# Patient Record
Sex: Male | Born: 1950 | Race: White | Hispanic: No | Marital: Married | Smoking: Current every day smoker
Health system: Southern US, Community
[De-identification: ages and names within clinical notes are randomized; demographics above are authoritative.]

## PROBLEM LIST (undated history)

## (undated) DIAGNOSIS — I739 Peripheral vascular disease, unspecified: Secondary | ICD-10-CM

## (undated) DIAGNOSIS — J329 Chronic sinusitis, unspecified: Secondary | ICD-10-CM

## (undated) DIAGNOSIS — G4700 Insomnia, unspecified: Secondary | ICD-10-CM

## (undated) DIAGNOSIS — H6690 Otitis media, unspecified, unspecified ear: Secondary | ICD-10-CM

## (undated) DIAGNOSIS — M1990 Unspecified osteoarthritis, unspecified site: Secondary | ICD-10-CM

## (undated) DIAGNOSIS — K121 Other forms of stomatitis: Secondary | ICD-10-CM

## (undated) DIAGNOSIS — E871 Hypo-osmolality and hyponatremia: Secondary | ICD-10-CM

## (undated) DIAGNOSIS — J449 Chronic obstructive pulmonary disease, unspecified: Secondary | ICD-10-CM

## (undated) DIAGNOSIS — E119 Type 2 diabetes mellitus without complications: Secondary | ICD-10-CM

## (undated) DIAGNOSIS — I10 Essential (primary) hypertension: Secondary | ICD-10-CM

## (undated) DIAGNOSIS — N200 Calculus of kidney: Secondary | ICD-10-CM

## (undated) HISTORY — PX: OTHER SURGICAL HISTORY: SHX169

## (undated) HISTORY — PX: DENTAL SURGERY: SHX609

## (undated) HISTORY — PX: HERNIA REPAIR: SHX51

## (undated) HISTORY — PX: NECK SURGERY: SHX720

---

## 1999-05-02 ENCOUNTER — Encounter: Payer: Self-pay | Admitting: Neurosurgery

## 1999-05-02 ENCOUNTER — Encounter: Admission: RE | Admit: 1999-05-02 | Discharge: 1999-05-02 | Payer: Self-pay | Admitting: Neurosurgery

## 2001-09-16 ENCOUNTER — Encounter: Payer: Self-pay | Admitting: Family Medicine

## 2001-09-16 ENCOUNTER — Ambulatory Visit (HOSPITAL_COMMUNITY): Admission: RE | Admit: 2001-09-16 | Discharge: 2001-09-16 | Payer: Self-pay | Admitting: Family Medicine

## 2001-11-05 ENCOUNTER — Ambulatory Visit (HOSPITAL_COMMUNITY): Admission: RE | Admit: 2001-11-05 | Discharge: 2001-11-05 | Payer: Self-pay | Admitting: Gastroenterology

## 2001-11-05 ENCOUNTER — Encounter (INDEPENDENT_AMBULATORY_CARE_PROVIDER_SITE_OTHER): Payer: Self-pay | Admitting: *Deleted

## 2002-09-02 ENCOUNTER — Ambulatory Visit (HOSPITAL_COMMUNITY): Admission: RE | Admit: 2002-09-02 | Discharge: 2002-09-02 | Payer: Self-pay | Admitting: Family Medicine

## 2002-09-02 ENCOUNTER — Encounter: Payer: Self-pay | Admitting: Family Medicine

## 2002-11-20 ENCOUNTER — Ambulatory Visit (HOSPITAL_COMMUNITY): Admission: RE | Admit: 2002-11-20 | Discharge: 2002-11-20 | Payer: Self-pay | Admitting: Neurosurgery

## 2002-11-20 ENCOUNTER — Encounter: Payer: Self-pay | Admitting: Neurosurgery

## 2002-12-26 ENCOUNTER — Encounter: Payer: Self-pay | Admitting: Neurosurgery

## 2002-12-30 ENCOUNTER — Inpatient Hospital Stay (HOSPITAL_COMMUNITY): Admission: RE | Admit: 2002-12-30 | Discharge: 2002-12-31 | Payer: Self-pay | Admitting: Neurosurgery

## 2002-12-30 ENCOUNTER — Encounter: Payer: Self-pay | Admitting: Neurosurgery

## 2003-01-05 ENCOUNTER — Inpatient Hospital Stay (HOSPITAL_COMMUNITY): Admission: RE | Admit: 2003-01-05 | Discharge: 2003-01-08 | Payer: Self-pay | Admitting: Neurosurgery

## 2005-07-24 ENCOUNTER — Ambulatory Visit (HOSPITAL_COMMUNITY): Admission: RE | Admit: 2005-07-24 | Discharge: 2005-07-24 | Payer: Self-pay | Admitting: Family Medicine

## 2007-04-30 ENCOUNTER — Encounter: Admission: RE | Admit: 2007-04-30 | Discharge: 2007-04-30 | Payer: Self-pay | Admitting: Internal Medicine

## 2007-06-24 ENCOUNTER — Encounter: Admission: RE | Admit: 2007-06-24 | Discharge: 2007-06-24 | Payer: Self-pay | Admitting: Internal Medicine

## 2007-07-17 ENCOUNTER — Ambulatory Visit (HOSPITAL_COMMUNITY): Admission: RE | Admit: 2007-07-17 | Discharge: 2007-07-17 | Payer: Self-pay | Admitting: Gastroenterology

## 2007-09-30 ENCOUNTER — Encounter: Admission: RE | Admit: 2007-09-30 | Discharge: 2007-09-30 | Payer: Self-pay | Admitting: Neurosurgery

## 2007-12-31 ENCOUNTER — Ambulatory Visit (HOSPITAL_COMMUNITY): Admission: RE | Admit: 2007-12-31 | Discharge: 2007-12-31 | Payer: Self-pay | Admitting: Family Medicine

## 2008-12-23 ENCOUNTER — Encounter: Admission: RE | Admit: 2008-12-23 | Discharge: 2008-12-23 | Payer: Self-pay | Admitting: Gastroenterology

## 2009-11-05 ENCOUNTER — Encounter: Admission: RE | Admit: 2009-11-05 | Discharge: 2009-11-05 | Payer: Self-pay | Admitting: Neurosurgery

## 2010-08-12 NOTE — Op Note (Signed)
   NAME:  Terry Munoz, Terry Munoz                        ACCOUNT NO.:  [id number]   MEDICAL RECORD NO.:  [id number]                   PATIENT TYPE:  OIB   LOCATION:  3104                                 FACILITY:  [facility]   PHYSICIAN:  Payton Doughty, M.D.                   DATE OF BIRTH:  1950-10-18   DATE OF PROCEDURE:  01/05/2003  DATE OF DISCHARGE:                                 OPERATIVE REPORT   PREOPERATIVE DIAGNOSIS:  Neck mass.   POSTOPERATIVE DIAGNOSIS:  Neck hematoma.   SURGEON:  Payton Doughty, M.D.   ANESTHESIA:  General endotracheal.   PREPARATION:  Prepped with alcohol wipe.   COMPLICATIONS:  None.   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  He has a neck mass.   DESCRIPTION OF PROCEDURE:  The patient was taken to the operating  room___________ intubated.  He was placed supine on the operating room  table.  Following he was shaved, prepped and draped in usual sterile  fashion.  The old skin incision was reopened.  Immediately beneath the  platysma was found a hematoma under significant pressure.  This came out of  the anterior cervical space and was evacuated with suction.  Careful  inspection did not reveal any frank bleeding points.  The wound was  irrigated copiously with saline; again, no bleeding points could be  identified.  The esophagus was carefully inspected and found to be intact.  A drain was placed just anterior to the plate and exited by separate  incision.  The wound was once again irrigated.  Hemostasis assured.  The  platysma was reapproximated with 3-0 Vicryl interrupted fashion.  The  subcutaneous tissue was reapproximated with 3-0 Vicryl in interrupted  fashion.  The skin was closed with 4-0 Vicryl in running subcuticular  fashion.  The drain was secured in place with a single 3-0 Vicryl.  Betadine  Telfa dressing was applied at the drain.   Steri-Strips and OpSite were  applied at the incision.  The patient then placed in a [place] collar and  returned to the recovery room in good condition.                                               Payton Doughty, M.D.    MWR/MEDQ  D:  01/05/2003  T:  01/06/2003  Job:  [id number]
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
## 2010-08-12 NOTE — Discharge Summary (Signed)
   NAME:  Terry Munoz, Terry Munoz                        ACCOUNT NO.:  [id number]   MEDICAL RECORD NO.:  [id number]                   PATIENT TYPE:  OIB   LOCATION:  3039                                 FACILITY:  [facility]   PHYSICIAN:  Payton Doughty, M.D.                   DATE OF BIRTH:  [birth year]   DATE OF ADMISSION:  01/05/2003  DATE OF DISCHARGE:  01/08/2003                                 DISCHARGE SUMMARY   ADMISSION DIAGNOSIS:  Neck hematoma status post anterior cervical  diskectomy.   DISCHARGE DIAGNOSIS:  Neck hematoma status post anterior cervical  diskectomy.   PROCEDURES:  Exploration of cervical incision.   COMPLICATIONS:  None.   DISCHARGE STATUS:  Alive and well.   HOSPITAL COURSE:  This is a 60 year old gentleman who underwent anterior  cervical a week and a half ago, reported to the office on October 11 with  dysphagia and difficulty with respirations.  Was admitted to the hospital  and underwent exploration of his wound.  Medical history is remarkable for  adult-onset diabetes.  General exam was intact save for some mild stridor.  Neurologically, he was intact.   Exploration yielded a hematoma.  A drain was placed.  Postoperatively, he  has done well.  Drain was left in for two days.  Output diminished to 3 ml  per shift.  He is now eating well, experiencing a minimal amount of  dysphagia.  Airway is good with no difficulty with respiration.  He is  afebrile.   He is being discharged home in the care of his family with Darvocet for  pain, nicotine patches, and Ambien for sleep.  His followup will be in the  [facility]' office in about 10 days.                                                Payton Doughty, M.D.    MWR/MEDQ  D:  01/08/2003  T:  01/08/2003  Job:  [id number]

## 2010-08-12 NOTE — Op Note (Signed)
NAME:  Terry Munoz, Terry Munoz                        ACCOUNT NO.:  [id number]   MEDICAL RECORD NO.:  [id number]                   PATIENT TYPE:  INP   LOCATION:  3013                                 FACILITY:  [facility]   PHYSICIAN:  Payton Doughty, M.D.                   DATE OF BIRTH:  July 19, 1950   DATE OF PROCEDURE:  12/30/2002  DATE OF DISCHARGE:                                 OPERATIVE REPORT   PREOPERATIVE DIAGNOSIS:  Cervical spondylosis and myelopathy.   POSTOPERATIVE DIAGNOSIS:  Cervical spondylosis and myelopathy.   PROCEDURE:  C4-5, C5-6, C6-7 anterior cervical decompression and fusion with  a Tether plate.   SURGEON:  Payton Doughty, M.D.   NURSE ASSISTANT:  [facility]   DOCTOR ASSISTANT:  Hilda Lias, M.D.   ANESTHESIA:  General endotracheal.   PREPARATION:  Sterile Betadine prep and scrub with alcohol wipe.   COMPLICATIONS:  none.   BODY OF TEXT:  This is a 60 year old gentleman who has had a previous fusion  at C3-4.  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  Taken to the operating room and smoothly anesthetized and intubated, placed  supine on the operating table with neck slightly extended in the Holter head  traction.  Following shave, prep, and drape in the usual sterile fashion,  the skin was incised starting two fingerbreadths below the level of the  carotid tubercle and extended in a cephalad dressing approximately 8 cm,  paralleling the sternocleidomastoid muscle on the left side.  The platysma  was identified, elevated, divided, and undermined.  The sternocleidomastoid  was identified and medial dissection revealed the carotid artery retracted  laterally to the left, trachea and esophagus retracted laterally to the  right, exposing the bones of the anterior cervical spine.  Dissection was  carried out and prominent landmarks of the osteophytes of C5-6 and C6-7 as  well as the plate at N8-2 were identified, thus obviating the need for  intraoperative x-ray at this point.  The longus colli was taken down  bilaterally and retracted.  The esophagus was protected.  Diskectomy was  carried out at C4-5, C5-6, and then at C6-7 under gross observation.  The  operating microscope was then brought in.  We used microdissection technique  to dissect the anterior epidural space, remove the osteophytes, decompress  the spinal cord and the neural foramina bilaterally.  Having completed this  decompression, 7 mm bone grafts were fashioned from patellar allograft and  tapped into place at each level.  A three-level Tether plate was then placed  with two screws in C4, two in C7, one in C5, and one in C6.  Intraoperative  x-ray showed good placement of bone graft, plate, and screws.  The wound was  irrigated and hemostasis assured.  The platysma was reapproximated with 3-0  Vicryl in interrupted fashion, the subcutaneous tissue was  reapproximated with  3-0 Vicryl in interrupted fashion, and the skin was  closed with 4-0 Vicryl in a running subcuticular fashion.  Benzoin and Steri-  Strips were placed, made occlusive with Telfa and OpSite, and the patient  returned to the recovery room in good condition.                                                Payton Doughty, M.D.    MWR/MEDQ  D:  12/30/2002  T:  12/31/2002  Job:  [id number]

## 2010-08-12 NOTE — H&P (Signed)
NAME:  Terry Munoz, Terry Munoz                        ACCOUNT NO.:  [id number]   MEDICAL RECORD NO.:  [id number]                   PATIENT TYPE:  OIB   LOCATION:  2857                                 FACILITY:  [facility]   PHYSICIAN:  Payton Doughty, M.D.                   DATE OF BIRTH:  09-Jun-1950   DATE OF ADMISSION:  01/05/2003  DATE OF DISCHARGE:                                HISTORY & PHYSICAL   ADMITTING DIAGNOSIS:  Neck mass.   HISTORY:  This is a very nice 60 year old right-handed white gentleman who  underwent an anterior cervical about a week ago.  It was done at C4-5, C5-6,  and C6-7, did well until 3 days ago when he noticed increased difficulty  with swallowing, not particularly painful.  He has some swelling of his neck  on the left side, it is not fluctuant, and he has not had any fever.  He  presents to the office with some dysphagia.  Medical history is remarkable  for diabetes on Glucophage 1000 mg b.i.d., as noted above he has had an  anterior cervical in 1999, hemothorax in 1984, kidney stones in 1990, he has  had a stress test a year ago that was all right.  His other meds are  Prilosec 20 mg a day.  He is allergic to SULFA.   SOCIAL HISTORY:  He smokes two to three packs of cigarettes a day, drinks  alcohol socially, and has been driving a truck.   FAMILY HISTORY:  Mother 57, fair health.  Dad 81, fair health.   REVIEW OF SYSTEMS:  Noncontributory for bladder dysfunction although he does  have neck pain, shoulder pain, arm pain and tingling, he had positive  Lhermitte's, these are all resolved, now he has dysphagia.   PHYSICAL EXAMINATION:  HEENT:  Within normal limits.  NECK:  He has limited range of motion in his neck.  The neck is tender on  the left side, there is no fluctuance, it is not red.  CHEST:  Diffuse crackles.  CARDIAC:  Regular rate and rhythm.  ABDOMEN:  Nontender.  No hepatosplenomegaly.  EXTREMITIES:  Without clubbing or cyanosis.  GU:  Deferred.   Peripheral pulses are good.  NEUROLOGICAL:  He is awake, alert, and oriented.  His cranial nerves are  intact.  Motor exam is 5/5 strength throughout the upper and lower  extremities.  Hoffmann's is now negative.  Reflexes are 2 throughout the  upper extremities, lower extremities are nonpathologic.  Lateral c-spine  demonstrates a lot of soft tissue swelling anterior to his fusion.   CLINICAL IMPRESSION:  Probable hematoma.  Obviously, there is a concern for  esophageal perforation although he does not look particularly toxic nor does  he have a fever.    PLAN:  Exploration of the neck.  The risks and benefits of this approach  have been discussed with him.  He wishes to proceed.                                                Payton Doughty, M.D.    MWR/MEDQ  D:  01/05/2003  T:  01/05/2003  Job:  [id number]

## 2010-11-03 ENCOUNTER — Other Ambulatory Visit: Payer: Self-pay | Admitting: Neurosurgery

## 2010-11-03 DIAGNOSIS — M47816 Spondylosis without myelopathy or radiculopathy, lumbar region: Secondary | ICD-10-CM

## 2010-11-04 ENCOUNTER — Ambulatory Visit
Admission: RE | Admit: 2010-11-04 | Discharge: 2010-11-04 | Disposition: A | Discharge status: Home / Self Care | Payer: 59 | Source: Ambulatory Visit | Attending: Neurosurgery | Admitting: Neurosurgery

## 2010-11-04 DIAGNOSIS — M47816 Spondylosis without myelopathy or radiculopathy, lumbar region: Secondary | ICD-10-CM

## 2010-11-04 MED ORDER — IOHEXOL 180 MG/ML  SOLN
1.0000 mL | Freq: Once | INTRAMUSCULAR | Status: AC | PRN
  Administered 2010-11-04: 1 mL via EPIDURAL

## 2010-11-04 MED ORDER — METHYLPREDNISOLONE ACETATE 40 MG/ML INJ SUSP (RADIOLOG
120.0000 mg | Freq: Once | INTRAMUSCULAR | Status: AC
  Administered 2010-11-04: 120 mg via EPIDURAL

## 2010-11-30 ENCOUNTER — Other Ambulatory Visit: Payer: Self-pay | Admitting: Neurosurgery

## 2010-11-30 DIAGNOSIS — M5126 Other intervertebral disc displacement, lumbar region: Secondary | ICD-10-CM

## 2010-12-07 ENCOUNTER — Ambulatory Visit
Admission: RE | Admit: 2010-12-07 | Discharge: 2010-12-07 | Disposition: A | Discharge status: Home / Self Care | Payer: 59 | Source: Ambulatory Visit | Attending: Neurosurgery | Admitting: Neurosurgery

## 2010-12-07 DIAGNOSIS — M5126 Other intervertebral disc displacement, lumbar region: Secondary | ICD-10-CM

## 2010-12-07 MED ORDER — METHYLPREDNISOLONE ACETATE 40 MG/ML INJ SUSP (RADIOLOG
120.0000 mg | Freq: Once | INTRAMUSCULAR | Status: AC
  Administered 2010-12-07: 120 mg via EPIDURAL

## 2010-12-07 MED ORDER — IOHEXOL 180 MG/ML  SOLN
1.0000 mL | Freq: Once | INTRAMUSCULAR | Status: AC | PRN
  Administered 2010-12-07: 1 mL via EPIDURAL

## 2010-12-28 ENCOUNTER — Other Ambulatory Visit: Payer: Self-pay | Admitting: Neurosurgery

## 2010-12-28 DIAGNOSIS — M5126 Other intervertebral disc displacement, lumbar region: Secondary | ICD-10-CM

## 2011-01-03 ENCOUNTER — Ambulatory Visit
Admission: RE | Admit: 2011-01-03 | Discharge: 2011-01-03 | Disposition: A | Discharge status: Home / Self Care | Payer: 59 | Source: Ambulatory Visit | Attending: Neurosurgery | Admitting: Neurosurgery

## 2011-01-03 DIAGNOSIS — M5126 Other intervertebral disc displacement, lumbar region: Secondary | ICD-10-CM

## 2011-01-03 MED ORDER — METHYLPREDNISOLONE ACETATE 40 MG/ML INJ SUSP (RADIOLOG
120.0000 mg | Freq: Once | INTRAMUSCULAR | Status: AC
  Administered 2011-01-03: 120 mg via EPIDURAL

## 2011-01-03 MED ORDER — IOHEXOL 180 MG/ML  SOLN
1.0000 mL | Freq: Once | INTRAMUSCULAR | Status: AC | PRN
  Administered 2011-01-03: 1 mL via EPIDURAL

## 2013-02-22 ENCOUNTER — Emergency Department (HOSPITAL_COMMUNITY)

## 2013-02-22 ENCOUNTER — Emergency Department (HOSPITAL_COMMUNITY)
Admission: EM | Admit: 2013-02-22 | Discharge: 2013-02-22 | Disposition: A | Discharge status: Home / Self Care | Attending: Emergency Medicine | Admitting: Emergency Medicine

## 2013-02-22 ENCOUNTER — Encounter (HOSPITAL_COMMUNITY): Payer: Self-pay | Admitting: Emergency Medicine

## 2013-02-22 DIAGNOSIS — Z794 Long term (current) use of insulin: Secondary | ICD-10-CM | POA: Insufficient documentation

## 2013-02-22 DIAGNOSIS — R079 Chest pain, unspecified: Secondary | ICD-10-CM

## 2013-02-22 DIAGNOSIS — Z8669 Personal history of other diseases of the nervous system and sense organs: Secondary | ICD-10-CM | POA: Insufficient documentation

## 2013-02-22 DIAGNOSIS — I10 Essential (primary) hypertension: Secondary | ICD-10-CM | POA: Insufficient documentation

## 2013-02-22 DIAGNOSIS — J4489 Other specified chronic obstructive pulmonary disease: Secondary | ICD-10-CM | POA: Insufficient documentation

## 2013-02-22 DIAGNOSIS — F17200 Nicotine dependence, unspecified, uncomplicated: Secondary | ICD-10-CM | POA: Insufficient documentation

## 2013-02-22 DIAGNOSIS — Z8719 Personal history of other diseases of the digestive system: Secondary | ICD-10-CM | POA: Insufficient documentation

## 2013-02-22 DIAGNOSIS — R0789 Other chest pain: Secondary | ICD-10-CM | POA: Insufficient documentation

## 2013-02-22 DIAGNOSIS — E119 Type 2 diabetes mellitus without complications: Secondary | ICD-10-CM | POA: Insufficient documentation

## 2013-02-22 DIAGNOSIS — Z79899 Other long term (current) drug therapy: Secondary | ICD-10-CM | POA: Insufficient documentation

## 2013-02-22 DIAGNOSIS — Z87442 Personal history of urinary calculi: Secondary | ICD-10-CM | POA: Insufficient documentation

## 2013-02-22 DIAGNOSIS — M1990 Unspecified osteoarthritis, unspecified site: Secondary | ICD-10-CM | POA: Insufficient documentation

## 2013-02-22 DIAGNOSIS — J449 Chronic obstructive pulmonary disease, unspecified: Secondary | ICD-10-CM | POA: Insufficient documentation

## 2013-02-22 HISTORY — DX: Type 2 diabetes mellitus without complications: E11.9

## 2013-02-22 HISTORY — DX: Calculus of kidney: N20.0

## 2013-02-22 HISTORY — DX: Unspecified osteoarthritis, unspecified site: M19.90

## 2013-02-22 HISTORY — DX: Insomnia, unspecified: G47.00

## 2013-02-22 HISTORY — DX: Essential (primary) hypertension: I10

## 2013-02-22 HISTORY — DX: Otitis media, unspecified, unspecified ear: H66.90

## 2013-02-22 HISTORY — DX: Chronic obstructive pulmonary disease, unspecified: J44.9

## 2013-02-22 HISTORY — DX: Chronic sinusitis, unspecified: J32.9

## 2013-02-22 HISTORY — DX: Other forms of stomatitis: K12.1

## 2013-02-22 LAB — BASIC METABOLIC PANEL
BUN: 14 mg/dL (ref 6–23)
CO2: 23 mEq/L (ref 19–32)
Calcium: 10.4 mg/dL (ref 8.4–10.5)
Chloride: 90 mEq/L — ABNORMAL LOW (ref 96–112)
Creatinine, Ser: 1.04 mg/dL (ref 0.50–1.35)
GFR calc Af Amer: 86 mL/min — ABNORMAL LOW (ref >90)
GFR calc non Af Amer: 74 mL/min — ABNORMAL LOW (ref >90)
Glucose, Bld: 216 mg/dL — ABNORMAL HIGH (ref 70–99)
Potassium: 4.2 mEq/L (ref 3.5–5.1)
Sodium: 133 mEq/L — ABNORMAL LOW (ref 135–145)

## 2013-02-22 LAB — CBC WITH DIFFERENTIAL/PLATELET
Basophils Absolute: 0 10*3/uL (ref 0.0–0.1)
Basophils Relative: 0 % (ref 0–1)
Eosinophils Absolute: 0 10*3/uL (ref 0.0–0.7)
Eosinophils Relative: 0 % (ref 0–5)
HCT: 47.5 % (ref 39.0–52.0)
Hemoglobin: 17.2 g/dL — ABNORMAL HIGH (ref 13.0–17.0)
Lymphocytes Relative: 7 % — ABNORMAL LOW (ref 12–46)
Lymphs Abs: 1.2 10*3/uL (ref 0.7–4.0)
MCH: 34 pg (ref 26.0–34.0)
MCHC: 36.2 g/dL — ABNORMAL HIGH (ref 30.0–36.0)
MCV: 93.9 fL (ref 78.0–100.0)
Monocytes Absolute: 1.2 10*3/uL — ABNORMAL HIGH (ref 0.1–1.0)
Monocytes Relative: 7 % (ref 3–12)
Neutro Abs: 15.1 10*3/uL — ABNORMAL HIGH (ref 1.7–7.7)
Neutrophils Relative %: 86 % — ABNORMAL HIGH (ref 43–77)
Platelets: 237 10*3/uL (ref 150–400)
RBC: 5.06 MIL/uL (ref 4.22–5.81)
RDW: 13.4 % (ref 11.5–15.5)
WBC: 17.5 10*3/uL — ABNORMAL HIGH (ref 4.0–10.5)

## 2013-02-22 LAB — TROPONIN I: Troponin I: <0.3 ng/mL (ref <0.30)

## 2013-02-22 MED ORDER — SODIUM CHLORIDE 0.9 % IV BOLUS (SEPSIS)
1000.0000 mL | Freq: Once | INTRAVENOUS | Status: AC
  Administered 2013-02-22: 1000 mL via INTRAVENOUS

## 2013-02-22 MED ORDER — GI COCKTAIL ~~LOC~~
10.0000 mL | Freq: Once | ORAL | Status: DC
  Filled 2013-02-22: qty 30

## 2013-02-22 MED ORDER — GI COCKTAIL ~~LOC~~
30.0000 mL | Freq: Once | ORAL | Status: AC
  Administered 2013-02-22: 30 mL via ORAL

## 2013-02-22 MED ORDER — PANTOPRAZOLE SODIUM 40 MG IV SOLR
40.0000 mg | Freq: Once | INTRAVENOUS | Status: AC
  Administered 2013-02-22: 40 mg via INTRAVENOUS
  Filled 2013-02-22: qty 40

## 2013-02-22 MED ORDER — FAMOTIDINE IN NACL 20-0.9 MG/50ML-% IV SOLN
20.0000 mg | Freq: Once | INTRAVENOUS | Status: AC
  Administered 2013-02-22: 20 mg via INTRAVENOUS
  Filled 2013-02-22: qty 50

## 2013-02-22 MED ORDER — SUCRALFATE 1 G PO TABS: 1.0000 g | ORAL_TABLET | Freq: Three times a day (TID) | ORAL | Status: DC

## 2013-02-22 NOTE — ED Notes (Signed)
Pt from [facility] via [facility] for chest pain described as buring, starting last night.  Pt denies n/v or pain radiating.  Pt received 324 mg aspirin.  Pt in NAD, A&O.

## 2013-02-22 NOTE — ED Provider Notes (Addendum)
CSN: [id number]     Arrival date & time 02/22/13  1015 History   First MD Initiated Contact with Patient 02/22/13 1050     Chief Complaint  Patient presents with  . Chest Pain   (Consider location/radiation/quality/duration/timing/severity/associated sxs/prior Treatment) HPI  62 year old male with chest pain. Gradual onset at approximately 10:00 last night this patient was laying down. Pain radiates from his epigastrium substernally nothing to his anterior neck. Describes a burning. Patient is a past history of "indigestion" and "heartburn.". He initially thought that this was what going on and propped up a few pillows behind his back and took 40mg  of omeprazole. This did not improve symptoms though. Pain is similar in character to pain he previously attributes to heartburn although increased intensity and longer duration than typical. Has been constant since it started. No appreciable exacerbating or relieving factors. He has not noticed any change with exertion. Denies any associated symptoms such as shortness of breath, palpitations, diaphoresis or nausea.  No fevers or chills. No cough. No unusual leg pain or swelling. No melena. Reports previous stress testing approximately 4 or 5 years ago which he thinks is unremarkable. Past history of hypertension and diabetes. Smoker.   Past Medical History  Diagnosis Date  . Hypertension   . Diabetes mellitus without complication   . COPD (chronic obstructive pulmonary disease)   . Otitis media   . Sinusitis   . Insomnia   . Osteoarthritis   . Mouth ulcers   . Kidney stones    Past Surgical History  Procedure Laterality Date  . Dental surgery    . Hernia repair    . Lithotomy    . Neck surgery     History reviewed. No pertinent family history. History  Substance Use Topics  . Smoking status: Current Every Day Smoker -- 2.00 packs/day    Types: Cigarettes  . Smokeless tobacco: Never Used  . Alcohol Use: 14.4 oz/week    24 Cans of beer  per week    Review of Systems  All systems reviewed and negative, other than as noted in HPI.   Allergies  Sulfa antibiotics  Home Medications   Current Outpatient Rx  Name  Route  Sig  Dispense  Refill  . albuterol (PROVENTIL) (2.5 MG/3ML) 0.083% nebulizer solution   Nebulization   Take 2.5 mg by nebulization every 6 (six) hours as needed for wheezing or shortness of breath.         Marland Kitchen amitriptyline (ELAVIL) 50 MG tablet   Oral   Take 50 mg by mouth at bedtime.         . celecoxib (CELEBREX) 200 MG capsule   Oral   Take 200 mg by mouth 2 (two) times daily as needed for mild pain.         Marland Kitchen Fluticasone-Salmeterol (ADVAIR) 250-50 MCG/DOSE AEPB   Inhalation   Inhale 1 puff into the lungs 2 (two) times daily.         . insulin detemir (LEVEMIR) 100 UNIT/ML injection   Subcutaneous   Inject 22 Units into the skin at bedtime.         Marland Kitchen olmesartan-hydrochlorothiazide (BENICAR HCT) 40-12.5 MG per tablet   Oral   Take 1 tablet by mouth daily.          BP 166/86  Temp(Src) 97.7 F (36.5 C) (Oral)  Resp 16  SpO2 97% Physical Exam  Nursing note and vitals reviewed. Constitutional: He appears well-developed and well-nourished. No distress.  HENT:  Head: Normocephalic and atraumatic.  Eyes: Conjunctivae are normal. Right eye exhibits no discharge. Left eye exhibits no discharge.  Neck: Neck supple.  Cardiovascular: Normal rate, regular rhythm and normal heart sounds.  Exam reveals no gallop and no friction rub.   No murmur heard. Pulmonary/Chest: Effort normal and breath sounds normal. No respiratory distress. He exhibits no tenderness.  Abdominal: Soft. He exhibits no distension. There is no tenderness.  Musculoskeletal: He exhibits no edema and no tenderness.  Neurological: He is alert.  Skin: Skin is warm and dry.  Psychiatric: He has a normal mood and affect. His behavior is normal. Thought content normal.    ED Course  Procedures (including critical  care time) Labs Review Labs Reviewed  CBC WITH DIFFERENTIAL - Abnormal; Notable for the following:    WBC 17.5 (*)    Hemoglobin 17.2 (*)    MCHC 36.2 (*)    Neutrophils Relative % 86 (*)    Neutro Abs 15.1 (*)    Lymphocytes Relative 7 (*)    Monocytes Absolute 1.2 (*)    All other components within normal limits  BASIC METABOLIC PANEL - Abnormal; Notable for the following:    Sodium 133 (*)    Chloride 90 (*)    Glucose, Bld 216 (*)    GFR calc non Af Amer 74 (*)    GFR calc Af Amer 86 (*)    All other components within normal limits  TROPONIN I   Imaging Review No results found.  Dg Chest 2 View  02/22/2013   CLINICAL DATA:  Chest pain  EXAM: CHEST - 2 VIEW  COMPARISON:  None available  FINDINGS: The heart size and mediastinal contours are within normal limits. Both lungs are clear. The visualized skeletal structures are unremarkable. No effusion. Cervical fixation hardware partially seen. No pneumothorax.  IMPRESSION: No acute cardiopulmonary disease.   Electronically Signed   By: Oley Balm M.D.   On: 02/22/2013 12:48   EKG Interpretation    Date/Time:  Saturday February 22 2013 10:21:05 EST Ventricular Rate:  106 PR Interval:  167 QRS Duration: 79 QT Interval:  337 QTC Calculation: 447 R Axis:   -32 Text Interpretation:  Sinus tachycardia Left axis deviation Anterior infarct, old Non-specific ST-t changes No significant change since last tracing from 2004 Confirmed by Saunders [place]  MD, Barbara Keng ([id number]) on 02/22/2013 11:31:39 AM            MDM   1. Chest pain     62 year old male with chest pain. Symptoms pretty atypical for ACS given the nature of them and constant duration since onset over 12 hours ago. No concerning associated symptoms.Patient does have multiple risk factors for coronary artery disease such as smoking, diabetes and hypertension, but again my suspicion for ACS is low.  Symptoms seem most consistent with a GI etiology with burning sensation from  epigastrium up to his neck. This EKG is largely unchanged from one approximately 10 years ago. He also has significant risk factors for reflux such as smoking and regular alcohol use. Doubt pancreatitis with nontender abdomen. Plan symptomatic treatment for dyspepsia. Likely discharge if further workup is unremarkable.    Raeford Razor, MD 02/26/13 2130  Raeford Razor, MD 03/04/13 [phone]

## 2013-07-17 ENCOUNTER — Ambulatory Visit
Admission: RE | Admit: 2013-07-17 | Discharge: 2013-07-17 | Disposition: A | Discharge status: Home / Self Care | Source: Ambulatory Visit | Attending: Family Medicine | Admitting: Family Medicine

## 2013-07-17 ENCOUNTER — Other Ambulatory Visit: Payer: Self-pay | Admitting: Family Medicine

## 2013-07-17 DIAGNOSIS — M79669 Pain in unspecified lower leg: Secondary | ICD-10-CM

## 2013-10-27 ENCOUNTER — Encounter: Payer: Self-pay | Admitting: Gastroenterology

## 2014-03-27 DIAGNOSIS — E871 Hypo-osmolality and hyponatremia: Secondary | ICD-10-CM

## 2014-03-27 HISTORY — DX: Hypo-osmolality and hyponatremia: E87.1

## 2014-04-01 DIAGNOSIS — I509 Heart failure, unspecified: Secondary | ICD-10-CM | POA: Diagnosis not present

## 2014-04-01 DIAGNOSIS — M79604 Pain in right leg: Secondary | ICD-10-CM | POA: Diagnosis not present

## 2014-04-07 DIAGNOSIS — I10 Essential (primary) hypertension: Secondary | ICD-10-CM | POA: Diagnosis not present

## 2014-04-07 DIAGNOSIS — E119 Type 2 diabetes mellitus without complications: Secondary | ICD-10-CM | POA: Diagnosis not present

## 2014-04-07 DIAGNOSIS — R0602 Shortness of breath: Secondary | ICD-10-CM | POA: Diagnosis not present

## 2014-04-07 DIAGNOSIS — R0789 Other chest pain: Secondary | ICD-10-CM | POA: Diagnosis not present

## 2014-04-09 DIAGNOSIS — I10 Essential (primary) hypertension: Secondary | ICD-10-CM | POA: Diagnosis not present

## 2014-04-13 DIAGNOSIS — R0789 Other chest pain: Secondary | ICD-10-CM | POA: Diagnosis not present

## 2014-04-14 DIAGNOSIS — I10 Essential (primary) hypertension: Secondary | ICD-10-CM | POA: Diagnosis not present

## 2014-04-16 DIAGNOSIS — R0902 Hypoxemia: Secondary | ICD-10-CM | POA: Diagnosis not present

## 2014-04-18 ENCOUNTER — Encounter (HOSPITAL_COMMUNITY): Payer: Self-pay | Admitting: Emergency Medicine

## 2014-04-18 ENCOUNTER — Other Ambulatory Visit: Payer: Self-pay

## 2014-04-18 ENCOUNTER — Inpatient Hospital Stay (HOSPITAL_COMMUNITY)
Admission: EM | Admit: 2014-04-18 | Discharge: 2014-04-24 | DRG: 299 | Disposition: A | Discharge status: Home Health | Payer: Medicare Other | Attending: Internal Medicine | Admitting: Internal Medicine

## 2014-04-18 DIAGNOSIS — Z7982 Long term (current) use of aspirin: Secondary | ICD-10-CM | POA: Diagnosis not present

## 2014-04-18 DIAGNOSIS — J441 Chronic obstructive pulmonary disease with (acute) exacerbation: Secondary | ICD-10-CM | POA: Diagnosis not present

## 2014-04-18 DIAGNOSIS — I10 Essential (primary) hypertension: Secondary | ICD-10-CM | POA: Diagnosis present

## 2014-04-18 DIAGNOSIS — Z7902 Long term (current) use of antithrombotics/antiplatelets: Secondary | ICD-10-CM

## 2014-04-18 DIAGNOSIS — F10231 Alcohol dependence with withdrawal delirium: Secondary | ICD-10-CM | POA: Diagnosis not present

## 2014-04-18 DIAGNOSIS — E785 Hyperlipidemia, unspecified: Secondary | ICD-10-CM | POA: Diagnosis present

## 2014-04-18 DIAGNOSIS — Z87442 Personal history of urinary calculi: Secondary | ICD-10-CM

## 2014-04-18 DIAGNOSIS — T380X5A Adverse effect of glucocorticoids and synthetic analogues, initial encounter: Secondary | ICD-10-CM | POA: Diagnosis not present

## 2014-04-18 DIAGNOSIS — Z794 Long term (current) use of insulin: Secondary | ICD-10-CM | POA: Diagnosis not present

## 2014-04-18 DIAGNOSIS — M1990 Unspecified osteoarthritis, unspecified site: Secondary | ICD-10-CM | POA: Diagnosis present

## 2014-04-18 DIAGNOSIS — R0902 Hypoxemia: Secondary | ICD-10-CM | POA: Diagnosis not present

## 2014-04-18 DIAGNOSIS — G9340 Encephalopathy, unspecified: Secondary | ICD-10-CM | POA: Diagnosis not present

## 2014-04-18 DIAGNOSIS — J690 Pneumonitis due to inhalation of food and vomit: Secondary | ICD-10-CM | POA: Diagnosis not present

## 2014-04-18 DIAGNOSIS — M79604 Pain in right leg: Secondary | ICD-10-CM

## 2014-04-18 DIAGNOSIS — Z882 Allergy status to sulfonamides status: Secondary | ICD-10-CM | POA: Diagnosis not present

## 2014-04-18 DIAGNOSIS — E1165 Type 2 diabetes mellitus with hyperglycemia: Secondary | ICD-10-CM | POA: Diagnosis not present

## 2014-04-18 DIAGNOSIS — R0603 Acute respiratory distress: Secondary | ICD-10-CM

## 2014-04-18 DIAGNOSIS — F10931 Alcohol use, unspecified with withdrawal delirium: Secondary | ICD-10-CM | POA: Diagnosis not present

## 2014-04-18 DIAGNOSIS — E876 Hypokalemia: Secondary | ICD-10-CM | POA: Diagnosis not present

## 2014-04-18 DIAGNOSIS — Z79899 Other long term (current) drug therapy: Secondary | ICD-10-CM

## 2014-04-18 DIAGNOSIS — E871 Hypo-osmolality and hyponatremia: Secondary | ICD-10-CM | POA: Diagnosis present

## 2014-04-18 DIAGNOSIS — F17210 Nicotine dependence, cigarettes, uncomplicated: Secondary | ICD-10-CM | POA: Diagnosis present

## 2014-04-18 DIAGNOSIS — J9601 Acute respiratory failure with hypoxia: Secondary | ICD-10-CM | POA: Diagnosis not present

## 2014-04-18 DIAGNOSIS — G4700 Insomnia, unspecified: Secondary | ICD-10-CM | POA: Diagnosis present

## 2014-04-18 DIAGNOSIS — I739 Peripheral vascular disease, unspecified: Principal | ICD-10-CM | POA: Diagnosis present

## 2014-04-18 DIAGNOSIS — Z720 Tobacco use: Secondary | ICD-10-CM | POA: Diagnosis present

## 2014-04-18 DIAGNOSIS — E119 Type 2 diabetes mellitus without complications: Secondary | ICD-10-CM

## 2014-04-18 HISTORY — DX: Hypo-osmolality and hyponatremia: E87.1

## 2014-04-18 LAB — BASIC METABOLIC PANEL
ANION GAP: 11 (ref 5–15)
Anion gap: 14 (ref 5–15)
BUN: 12 mg/dL (ref 6–23)
BUN: 13 mg/dL (ref 6–23)
CHLORIDE: 76 mmol/L — AB (ref 96–112)
CHLORIDE: 77 mmol/L — AB (ref 96–112)
CO2: 28 mmol/L (ref 19–32)
CO2: 31 mmol/L (ref 19–32)
Calcium: 9.1 mg/dL (ref 8.4–10.5)
Calcium: 9.2 mg/dL (ref 8.4–10.5)
Creatinine, Ser: 1.16 mg/dL (ref 0.50–1.35)
Creatinine, Ser: 1.17 mg/dL (ref 0.50–1.35)
GFR calc Af Amer: 75 mL/min — ABNORMAL LOW (ref >90)
GFR calc Af Amer: 74 mL/min — ABNORMAL LOW (ref >90)
GFR, EST NON AFRICAN AMERICAN: 64 mL/min — AB (ref >90)
GFR, EST NON AFRICAN AMERICAN: 64 mL/min — AB (ref >90)
Glucose, Bld: 71 mg/dL (ref 70–99)
Glucose, Bld: 71 mg/dL (ref 70–99)
POTASSIUM: 4 mmol/L (ref 3.5–5.1)
Potassium: 4.6 mmol/L (ref 3.5–5.1)
Sodium: 118 mmol/L — CL (ref 135–145)
Sodium: 119 mmol/L — CL (ref 135–145)

## 2014-04-18 LAB — PROTIME-INR
INR: 0.87 (ref 0.00–1.49)
PROTHROMBIN TIME: 12 s (ref 11.6–15.2)

## 2014-04-18 LAB — CBC WITH DIFFERENTIAL/PLATELET
BASOS PCT: 0 % (ref 0–1)
Basophils Absolute: 0 10*3/uL (ref 0.0–0.1)
EOS PCT: 1 % (ref 0–5)
Eosinophils Absolute: 0.1 10*3/uL (ref 0.0–0.7)
HEMATOCRIT: 34.7 % — AB (ref 39.0–52.0)
Hemoglobin: 13.4 g/dL (ref 13.0–17.0)
Lymphocytes Relative: 10 % — ABNORMAL LOW (ref 12–46)
Lymphs Abs: 1.1 10*3/uL (ref 0.7–4.0)
MCH: 34.5 pg — ABNORMAL HIGH (ref 26.0–34.0)
MCHC: 38.6 g/dL — ABNORMAL HIGH (ref 30.0–36.0)
MCV: 89.4 fL (ref 78.0–100.0)
MONO ABS: 0.9 10*3/uL (ref 0.1–1.0)
Monocytes Relative: 8 % (ref 3–12)
NEUTROS ABS: 8.7 10*3/uL — AB (ref 1.7–7.7)
Neutrophils Relative %: 81 % — ABNORMAL HIGH (ref 43–77)
Platelets: 193 10*3/uL (ref 150–400)
RBC: 3.88 MIL/uL — ABNORMAL LOW (ref 4.22–5.81)
RDW: 12.4 % (ref 11.5–15.5)
WBC: 10.8 10*3/uL — ABNORMAL HIGH (ref 4.0–10.5)

## 2014-04-18 LAB — URINALYSIS, ROUTINE W REFLEX MICROSCOPIC
Bilirubin Urine: NEGATIVE
Glucose, UA: NEGATIVE mg/dL
HGB URINE DIPSTICK: NEGATIVE
KETONES UR: 15 mg/dL — AB
Leukocytes, UA: NEGATIVE
Nitrite: NEGATIVE
PH: 6 (ref 5.0–8.0)
PROTEIN: NEGATIVE mg/dL
Specific Gravity, Urine: 1.009 (ref 1.005–1.030)
Urobilinogen, UA: 1 mg/dL (ref 0.0–1.0)

## 2014-04-18 LAB — GLUCOSE, CAPILLARY: Glucose-Capillary: 62 mg/dL — ABNORMAL LOW (ref 70–99)

## 2014-04-18 MED ORDER — INSULIN GLARGINE 100 UNIT/ML ~~LOC~~ SOLN
25.0000 [IU] | Freq: Every day | SUBCUTANEOUS | Status: DC
Start: 2014-04-19 — End: 2014-04-20
  Administered 2014-04-19: 25 [IU] via SUBCUTANEOUS
  Filled 2014-04-18 (×2): qty 0.25

## 2014-04-18 MED ORDER — HYDROMORPHONE HCL 1 MG/ML IJ SOLN
1.0000 mg | INTRAMUSCULAR | Status: DC | PRN
  Administered 2014-04-18 – 2014-04-20 (×3): 1 mg via INTRAVENOUS
  Filled 2014-04-18 (×3): qty 1

## 2014-04-18 MED ORDER — HYDROCODONE-ACETAMINOPHEN 5-325 MG PO TABS
1.0000 | ORAL_TABLET | Freq: Four times a day (QID) | ORAL | Status: DC | PRN
  Administered 2014-04-19: 1 via ORAL
  Filled 2014-04-18: qty 1

## 2014-04-18 MED ORDER — ACETAMINOPHEN 325 MG PO TABS: 650.0000 mg | ORAL_TABLET | Freq: Four times a day (QID) | ORAL | Status: DC | PRN

## 2014-04-18 MED ORDER — ZOLPIDEM TARTRATE 5 MG PO TABS: 10.0000 mg | ORAL_TABLET | Freq: Every evening | ORAL | Status: DC | PRN

## 2014-04-18 MED ORDER — METOPROLOL TARTRATE 25 MG PO TABS
25.0000 mg | ORAL_TABLET | Freq: Two times a day (BID) | ORAL | Status: DC
  Administered 2014-04-18 – 2014-04-19 (×3): 25 mg via ORAL
  Filled 2014-04-18 (×3): qty 1

## 2014-04-18 MED ORDER — ZOLPIDEM TARTRATE 5 MG PO TABS
5.0000 mg | ORAL_TABLET | Freq: Every evening | ORAL | Status: DC | PRN
  Administered 2014-04-19: 5 mg via ORAL
  Filled 2014-04-18: qty 1

## 2014-04-18 MED ORDER — HYDROCHLOROTHIAZIDE 12.5 MG PO CAPS
12.5000 mg | ORAL_CAPSULE | Freq: Every day | ORAL | Status: DC
  Administered 2014-04-18: 12.5 mg via ORAL
  Filled 2014-04-18 (×2): qty 1

## 2014-04-18 MED ORDER — ASPIRIN EC 81 MG PO TBEC
81.0000 mg | DELAYED_RELEASE_TABLET | Freq: Every day | ORAL | Status: DC
  Administered 2014-04-19 – 2014-04-24 (×4): 81 mg via ORAL
  Filled 2014-04-18 (×6): qty 1

## 2014-04-18 MED ORDER — OXYCODONE-ACETAMINOPHEN 5-325 MG PO TABS: 1.0000 | ORAL_TABLET | Freq: Four times a day (QID) | ORAL | Status: DC | PRN

## 2014-04-18 MED ORDER — SODIUM CHLORIDE 0.9 % IV SOLN: 250.0000 mL | INTRAVENOUS | Status: DC | PRN

## 2014-04-18 MED ORDER — NICOTINE 21 MG/24HR TD PT24
21.0000 mg | MEDICATED_PATCH | Freq: Every day | TRANSDERMAL | Status: DC
  Administered 2014-04-18 – 2014-04-24 (×6): 21 mg via TRANSDERMAL
  Filled 2014-04-18 (×6): qty 1

## 2014-04-18 MED ORDER — ONDANSETRON HCL 4 MG PO TABS: 4.0000 mg | ORAL_TABLET | Freq: Four times a day (QID) | ORAL | Status: DC | PRN

## 2014-04-18 MED ORDER — ACETAMINOPHEN 650 MG RE SUPP: 650.0000 mg | Freq: Four times a day (QID) | RECTAL | Status: DC | PRN

## 2014-04-18 MED ORDER — ALBUTEROL SULFATE (2.5 MG/3ML) 0.083% IN NEBU
2.5000 mg | INHALATION_SOLUTION | Freq: Four times a day (QID) | RESPIRATORY_TRACT | Status: DC | PRN
  Administered 2014-04-21: 2.5 mg via RESPIRATORY_TRACT
  Filled 2014-04-18: qty 3

## 2014-04-18 MED ORDER — ALUM & MAG HYDROXIDE-SIMETH 200-200-20 MG/5ML PO SUSP: 30.0000 mL | Freq: Four times a day (QID) | ORAL | Status: DC | PRN

## 2014-04-18 MED ORDER — HEPARIN (PORCINE) IN NACL 100-0.45 UNIT/ML-% IJ SOLN
1000.0000 [IU]/h | INTRAMUSCULAR | Status: DC
  Administered 2014-04-18 – 2014-04-19 (×2): 1000 [IU]/h via INTRAVENOUS
  Filled 2014-04-18 (×2): qty 250

## 2014-04-18 MED ORDER — ATORVASTATIN CALCIUM 40 MG PO TABS
40.0000 mg | ORAL_TABLET | Freq: Every day | ORAL | Status: DC
  Administered 2014-04-18 – 2014-04-19 (×2): 40 mg via ORAL
  Filled 2014-04-18 (×2): qty 1

## 2014-04-18 MED ORDER — POTASSIUM CHLORIDE CRYS ER 10 MEQ PO TBCR: 10.0000 meq | EXTENDED_RELEASE_TABLET | Freq: Once | ORAL | Status: DC

## 2014-04-18 MED ORDER — AMITRIPTYLINE HCL 25 MG PO TABS
50.0000 mg | ORAL_TABLET | Freq: Every day | ORAL | Status: DC
  Administered 2014-04-18 – 2014-04-19 (×2): 50 mg via ORAL
  Filled 2014-04-18 (×2): qty 2

## 2014-04-18 MED ORDER — SODIUM CHLORIDE 0.9 % IJ SOLN
3.0000 mL | Freq: Two times a day (BID) | INTRAMUSCULAR | Status: DC
  Administered 2014-04-21 – 2014-04-23 (×3): 3 mL via INTRAVENOUS

## 2014-04-18 MED ORDER — FUROSEMIDE 20 MG PO TABS
20.0000 mg | ORAL_TABLET | Freq: Every day | ORAL | Status: DC
  Administered 2014-04-19: 20 mg via ORAL
  Filled 2014-04-18: qty 1

## 2014-04-18 MED ORDER — SODIUM CHLORIDE 0.9 % IJ SOLN
3.0000 mL | INTRAMUSCULAR | Status: DC | PRN
  Administered 2014-04-22: 3 mL via INTRAVENOUS
  Filled 2014-04-18: qty 3

## 2014-04-18 MED ORDER — M.V.I. ADULT IV INJ
INJECTION | Freq: Once | INTRAVENOUS | Status: AC
  Administered 2014-04-18: 21:00:00 via INTRAVENOUS
  Filled 2014-04-18: qty 1000

## 2014-04-18 MED ORDER — CELECOXIB 200 MG PO CAPS
200.0000 mg | ORAL_CAPSULE | Freq: Two times a day (BID) | ORAL | Status: DC | PRN
  Filled 2014-04-18: qty 1

## 2014-04-18 MED ORDER — SUCRALFATE 1 G PO TABS
1.0000 g | ORAL_TABLET | Freq: Three times a day (TID) | ORAL | Status: DC
  Administered 2014-04-19 (×4): 1 g via ORAL
  Filled 2014-04-18 (×4): qty 1

## 2014-04-18 MED ORDER — CLOPIDOGREL BISULFATE 75 MG PO TABS
75.0000 mg | ORAL_TABLET | Freq: Every day | ORAL | Status: DC
  Administered 2014-04-18 – 2014-04-24 (×5): 75 mg via ORAL
  Filled 2014-04-18 (×5): qty 1

## 2014-04-18 MED ORDER — ONDANSETRON HCL 4 MG/2ML IJ SOLN: 4.0000 mg | Freq: Four times a day (QID) | INTRAMUSCULAR | Status: DC | PRN

## 2014-04-18 MED ORDER — MOMETASONE FURO-FORMOTEROL FUM 100-5 MCG/ACT IN AERO
2.0000 | INHALATION_SPRAY | Freq: Two times a day (BID) | RESPIRATORY_TRACT | Status: DC
  Administered 2014-04-18 – 2014-04-24 (×8): 2 via RESPIRATORY_TRACT
  Filled 2014-04-18 (×2): qty 8.8

## 2014-04-18 MED ORDER — HEPARIN BOLUS VIA INFUSION
4000.0000 [IU] | Freq: Once | INTRAVENOUS | Status: AC
  Administered 2014-04-18: 4000 [IU] via INTRAVENOUS
  Filled 2014-04-18: qty 4000

## 2014-04-18 MED ORDER — MORPHINE SULFATE 4 MG/ML IJ SOLN
4.0000 mg | Freq: Once | INTRAMUSCULAR | Status: AC
  Administered 2014-04-18: 4 mg via INTRAVENOUS
  Filled 2014-04-18: qty 1

## 2014-04-18 MED ORDER — IRBESARTAN 150 MG PO TABS
300.0000 mg | ORAL_TABLET | Freq: Every day | ORAL | Status: DC
  Administered 2014-04-18: 300 mg via ORAL
  Filled 2014-04-18 (×3): qty 2

## 2014-04-18 MED ORDER — OXYCODONE-ACETAMINOPHEN 5-325 MG PO TABS
1.0000 | ORAL_TABLET | ORAL | Status: DC | PRN
  Administered 2014-04-19 (×2): 1 via ORAL
  Filled 2014-04-18 (×2): qty 1

## 2014-04-18 MED ORDER — OLMESARTAN MEDOXOMIL-HCTZ 40-12.5 MG PO TABS: 1.0000 | ORAL_TABLET | Freq: Every day | ORAL | Status: DC

## 2014-04-18 NOTE — ED Notes (Signed)
Meal Tray ordered.  

## 2014-04-18 NOTE — Progress Notes (Signed)
Pt arrived to 3W02 via stretcher from ED.  Family and Torie, RN at bedside.  Dr. Einar Gip paged to notify of pt arrival to department.  Will continue to monitor.

## 2014-04-18 NOTE — ED Notes (Signed)
MD made aware of Critical Sodium 118

## 2014-04-18 NOTE — ED Notes (Signed)
Attempted report 

## 2014-04-18 NOTE — Progress Notes (Signed)
ANTICOAGULATION CONSULT NOTE - Initial Consult  Pharmacy Consult for heparin Indication: DVT  Allergies  Allergen Reactions  . Sulfa Antibiotics Hives    Patient Measurements: Height: 5\' 8"  (172.7 cm) Weight: 158 lb (71.668 kg) IBW/kg (Calculated) : 68.4 Heparin Dosing Weight: 71kg  Vital Signs: Temp: 97.9 F (36.6 C) (01/23 1728) Temp Source: Oral (01/23 1728) BP: 135/78 mmHg (01/23 1900) Pulse Rate: 83 (01/23 1900)  Labs:  Recent Labs  04/18/14 1745  LABPROT 12.0  INR 0.87  CREATININE 1.16    Estimated Creatinine Clearance: 61.4 mL/min (by C-G formula based on Cr of 1.16).   Medical History: Past Medical History  Diagnosis Date  . Hypertension   . Diabetes mellitus without complication   . COPD (chronic obstructive pulmonary disease)   . Otitis media   . Sinusitis   . Insomnia   . Osteoarthritis   . Mouth ulcers   . Kidney stones     Assessment: 64 year old male admitted for possible DVT in right lower leg, history seems a little unclear after talking with patient. He may have had a clot in chest and or throat before? Orders to start heparin for now until doppers rule in/out. Noted patient had negative dopplers 07/17/13.  CBC in process, renal function is normal. Noted patient was on asa/plavix pta.  Goal of Therapy:  Heparin level 0.3-0.7 units/ml Monitor platelets by anticoagulation protocol: Yes   Plan:  Give 4000 units bolus x 1 Start heparin infusion at 1000 units/hr Check anti-Xa level in 6 hours and daily while on heparin Continue to monitor H&H and platelets  Erin Hearing PharmD., BCPS Clinical Pharmacist Pager [phone] 04/18/2014 7:33 PM

## 2014-04-18 NOTE — ED Provider Notes (Signed)
CSN: [id number]     Arrival date & time 04/18/14  1719 History   First MD Initiated Contact with Patient 04/18/14 1723     Chief Complaint  Patient presents with  . DVT     (Consider location/radiation/quality/duration/timing/severity/associated sxs/prior Treatment) Patient is a 64 y.o. male presenting with leg pain. The history is provided by the patient. No language interpreter was used.  Leg Pain Location:  Leg, ankle and foot Time since incident:  1 day (pain for several months, worse for 1 day) Injury: no   Leg location:  R lower leg Ankle location:  R ankle Foot location:  R foot Pain details:    Quality:  Aching   Radiates to:  Does not radiate   Severity:  Moderate   Onset quality:  Sudden   Duration:  1 day   Timing:  Constant   Progression:  Worsening Chronicity:  Chronic Dislocation: no   Foreign body present:  No foreign bodies Tetanus status:  Up to date Prior injury to area:  No Relieved by:  Nothing Worsened by:  Nothing tried Ineffective treatments: percocet. Associated symptoms: no back pain, no fatigue and no fever   Risk factors comment:  Hx of several months of ischemia of RLE, CTA planned for Monday   Past Medical History  Diagnosis Date  . Hypertension   . Diabetes mellitus without complication   . COPD (chronic obstructive pulmonary disease)   . Otitis media   . Sinusitis   . Insomnia   . Osteoarthritis   . Mouth ulcers   . Kidney stones    Past Surgical History  Procedure Laterality Date  . Dental surgery    . Hernia repair    . Lithotomy    . Neck surgery     No family history on file. History  Substance Use Topics  . Smoking status: Current Every Day Smoker -- 2.00 packs/day    Types: Cigarettes  . Smokeless tobacco: Never Used  . Alcohol Use: 14.4 oz/week    24 Cans of beer per week    Review of Systems  Constitutional: Negative for fever, activity change, appetite change and fatigue.  HENT: Negative for congestion,  facial swelling, rhinorrhea and trouble swallowing.   Eyes: Negative for photophobia and pain.  Respiratory: Negative for cough, chest tightness and shortness of breath.   Cardiovascular: Negative for chest pain and leg swelling.  Gastrointestinal: Negative for nausea, vomiting, abdominal pain, diarrhea and constipation.  Endocrine: Negative for polydipsia and polyuria.  Genitourinary: Negative for dysuria, urgency, decreased urine volume and difficulty urinating.  Musculoskeletal: Negative for back pain and gait problem.  Skin: Negative for color change, rash and wound.  Allergic/Immunologic: Negative for immunocompromised state.  Neurological: Negative for dizziness, facial asymmetry, speech difficulty, weakness, numbness and headaches.  Psychiatric/Behavioral: Negative for confusion, decreased concentration and agitation.      Allergies  Sulfa antibiotics  Home Medications   Prior to Admission medications   Medication Sig Start Date End Date Taking? Authorizing Provider  albuterol (PROVENTIL) (2.5 MG/3ML) 0.083% nebulizer solution Take 2.5 mg by nebulization every 6 (six) hours as needed for wheezing or shortness of breath.   Yes Historical Provider, MD  amitriptyline (ELAVIL) 50 MG tablet Take 50 mg by mouth at bedtime.   Yes Historical Provider, MD  aspirin 81 MG tablet Take 81 mg by mouth daily.   Yes Historical Provider, MD  atorvastatin (LIPITOR) 40 MG tablet Take 40 mg by mouth daily.   Yes Historical  Provider, MD  celecoxib (CELEBREX) 200 MG capsule Take 200 mg by mouth 2 (two) times daily as needed for mild pain.   Yes Historical Provider, MD  clopidogrel (PLAVIX) 75 MG tablet Take 75 mg by mouth daily.   Yes Historical Provider, MD  Fluticasone-Salmeterol (ADVAIR) 250-50 MCG/DOSE AEPB Inhale 1 puff into the lungs 2 (two) times daily.   Yes Historical Provider, MD  furosemide (LASIX) 40 MG tablet Take 20 mg by mouth daily.   Yes Historical Provider, MD    HYDROcodone-acetaminophen (NORCO/VICODIN) 5-325 MG per tablet Take 1 tablet by mouth every 6 (six) hours as needed for moderate pain.   Yes Historical Provider, MD  insulin glargine (LANTUS) 100 UNIT/ML injection Inject 25 Units into the skin daily.   Yes Historical Provider, MD  metoprolol tartrate (LOPRESSOR) 25 MG tablet Take 25 mg by mouth 2 (two) times daily.   Yes Historical Provider, MD  olmesartan-hydrochlorothiazide (BENICAR HCT) 40-12.5 MG per tablet Take 1 tablet by mouth daily.   Yes Historical Provider, MD  oxyCODONE-acetaminophen (PERCOCET/ROXICET) 5-325 MG per tablet Take 1 tablet by mouth every 4 (four) hours as needed for severe pain.   Yes Historical Provider, MD  potassium chloride (MICRO-K) 10 MEQ CR capsule Take 10 mEq by mouth daily.   Yes Historical Provider, MD  sucralfate (CARAFATE) 1 G tablet Take 1 tablet (1 g total) by mouth 4 (four) times daily -  with meals and at bedtime. 02/22/13  Yes Virgel Manifold, MD  zolpidem (AMBIEN) 10 MG tablet Take 10 mg by mouth at bedtime as needed for sleep.   Yes Historical Provider, MD   BP 138/73 mmHg  Pulse 96  Temp(Src) 98.5 F (36.9 C) (Oral)  Resp 22  Ht 5\' 8"  (1.727 m)  Wt 182 lb 1.6 oz (82.6 kg)  BMI 27.69 kg/m2  SpO2 95% Physical Exam  Constitutional: He is oriented to person, place, and time. He appears well-developed and well-nourished. No distress.  HENT:  Head: Normocephalic and atraumatic.  Mouth/Throat: No oropharyngeal exudate.  Eyes: Pupils are equal, round, and reactive to light.  Neck: Normal range of motion. Neck supple.  Cardiovascular: Normal rate, regular rhythm and normal heart sounds.  Exam reveals no gallop and no friction rub.   No murmur heard. Pulmonary/Chest: Effort normal and breath sounds normal. No respiratory distress. He has no wheezes. He has no rales.  Abdominal: Soft. Bowel sounds are normal. He exhibits no distension and no mass. There is no tenderness. There is no rebound and no guarding.   Musculoskeletal: Normal range of motion. He exhibits edema and tenderness.       Legs: Unable to palpate pulses in right foot.  Doppler used to auscultate, right dorsalis pedis pulse unable to auscultate right posterior tibial pulse   Neurological: He is alert and oriented to person, place, and time.  Skin: Skin is warm and dry.  Psychiatric: He has a normal mood and affect.    ED Course  Procedures (including critical care time) Labs Review Labs Reviewed  CBC WITH DIFFERENTIAL/PLATELET - Abnormal; Notable for the following:    WBC 10.8 (*)    RBC 3.88 (*)    HCT 34.7 (*)    MCH 34.5 (*)    MCHC 38.6 (*)    Neutrophils Relative % 81 (*)    Neutro Abs 8.7 (*)    Lymphocytes Relative 10 (*)    All other components within normal limits  BASIC METABOLIC PANEL - Abnormal; Notable for the following:  Sodium 118 (*)    Chloride 76 (*)    GFR calc non Af Amer 64 (*)    GFR calc Af Amer 75 (*)    All other components within normal limits  BASIC METABOLIC PANEL - Abnormal; Notable for the following:    Sodium 119 (*)    Chloride 77 (*)    GFR calc non Af Amer 64 (*)    GFR calc Af Amer 74 (*)    All other components within normal limits  URINALYSIS, ROUTINE W REFLEX MICROSCOPIC - Abnormal; Notable for the following:    Ketones, ur 15 (*)    All other components within normal limits  GLUCOSE, CAPILLARY - Abnormal; Notable for the following:    Glucose-Capillary 62 (*)    All other components within normal limits  PROTIME-INR  HEPARIN LEVEL (UNFRACTIONATED)  CBC  BASIC METABOLIC PANEL    Imaging Review No results found.   EKG Interpretation   Date/Time:  Saturday April 18 2014 17:26:36 EST Ventricular Rate:  82 PR Interval:  169 QRS Duration: 86 QT Interval:  363 QTC Calculation: 424 R Axis:   50 Text Interpretation:  Sinus rhythm Probable anteroseptal infarct, old  Baseline wander in lead(s) V6 Left axis resolved, otherwise uncahnged.  Confirmed by DOCHERTY   MD, [place] [phone]) on 04/18/2014 5:45:46 PM      MDM   Final diagnoses:  Right leg pain    Pt is a 64 y.o. male with Pmhx as above who presents with hx of chronic RLE ischemia who presents with 1 day of inc RLE pain/swelling. He denies CP, SOB, ab pain, fever. On PE, he has tender pitting edema and erythema.  The right lower extremity from mid shin distally.  Dorsalis pedis pulses are not palpable.  They are found on ultrasound and are decreased on the right Appeared to the left.  Posterior tibial pulse unable to be palpated, and unable to be found on doppler on the R.  He has a CTA scheduled for Monday.  I spoke with Dr. Nadyne Coombes , who sent him to the emergency department for evaluation and he will admit to telemetry bed.  Patient incidentally found to have hyponatremia but does not appear to be symptomatic.    Ernestina Patches, MD 04/18/14 [phone]

## 2014-04-18 NOTE — ED Notes (Signed)
Pt arrived from home by [facility] with c/o possible dvt to right lower leg. Pt is supposed to have procedure to right calf artery because it is started to become blocked but today pt has increased swelling and pain to leg. Right outer side of calf is red as well. Pt did fall 2 days ago but denies injuries from fall.

## 2014-04-19 DIAGNOSIS — I739 Peripheral vascular disease, unspecified: Secondary | ICD-10-CM | POA: Diagnosis not present

## 2014-04-19 DIAGNOSIS — M79604 Pain in right leg: Secondary | ICD-10-CM | POA: Diagnosis not present

## 2014-04-19 DIAGNOSIS — I10 Essential (primary) hypertension: Secondary | ICD-10-CM | POA: Diagnosis not present

## 2014-04-19 LAB — BASIC METABOLIC PANEL
ANION GAP: 13 (ref 5–15)
BUN: 11 mg/dL (ref 6–23)
CO2: 29 mmol/L (ref 19–32)
CREATININE: 1.14 mg/dL (ref 0.50–1.35)
Calcium: 8.6 mg/dL (ref 8.4–10.5)
Chloride: 77 mmol/L — ABNORMAL LOW (ref 96–112)
GFR calc non Af Amer: 66 mL/min — ABNORMAL LOW (ref >90)
GFR, EST AFRICAN AMERICAN: 76 mL/min — AB (ref >90)
Glucose, Bld: 136 mg/dL — ABNORMAL HIGH (ref 70–99)
POTASSIUM: 3.8 mmol/L (ref 3.5–5.1)
Sodium: 119 mmol/L — CL (ref 135–145)

## 2014-04-19 LAB — CBC
HCT: 32.3 % — ABNORMAL LOW (ref 39.0–52.0)
Hemoglobin: 12.2 g/dL — ABNORMAL LOW (ref 13.0–17.0)
MCH: 33.8 pg (ref 26.0–34.0)
MCHC: >37 g/dL — ABNORMAL HIGH (ref 30.0–36.0)
MCV: 89.5 fL (ref 78.0–100.0)
PLATELETS: 194 10*3/uL (ref 150–400)
RBC: 3.61 MIL/uL — ABNORMAL LOW (ref 4.22–5.81)
RDW: 12.3 % (ref 11.5–15.5)
WBC: 9.3 10*3/uL (ref 4.0–10.5)

## 2014-04-19 LAB — HEPARIN LEVEL (UNFRACTIONATED)
Heparin Unfractionated: 0.7 IU/mL (ref 0.30–0.70)
Heparin Unfractionated: 0.52 IU/mL (ref 0.30–0.70)

## 2014-04-19 LAB — GLUCOSE, CAPILLARY
GLUCOSE-CAPILLARY: 100 mg/dL — AB (ref 70–99)
GLUCOSE-CAPILLARY: 143 mg/dL — AB (ref 70–99)
Glucose-Capillary: 147 mg/dL — ABNORMAL HIGH (ref 70–99)
Glucose-Capillary: 140 mg/dL — ABNORMAL HIGH (ref 70–99)
Glucose-Capillary: 58 mg/dL — ABNORMAL LOW (ref 70–99)
Glucose-Capillary: 84 mg/dL (ref 70–99)

## 2014-04-19 MED ORDER — SODIUM CHLORIDE 0.9 % IV SOLN
INTRAVENOUS | Status: DC
  Administered 2014-04-20: 05:00:00 via INTRAVENOUS

## 2014-04-19 MED ORDER — INSULIN ASPART 100 UNIT/ML ~~LOC~~ SOLN
0.0000 [IU] | Freq: Three times a day (TID) | SUBCUTANEOUS | Status: DC
  Administered 2014-04-19: 2 [IU] via SUBCUTANEOUS

## 2014-04-19 MED ORDER — ALPRAZOLAM 0.5 MG PO TABS
0.5000 mg | ORAL_TABLET | Freq: Three times a day (TID) | ORAL | Status: DC | PRN
  Administered 2014-04-19: 0.5 mg via ORAL
  Filled 2014-04-19: qty 1

## 2014-04-19 NOTE — Progress Notes (Signed)
ANTICOAGULATION CONSULT NOTE - Follow Up Consult  Pharmacy Consult for Heparin  Indication: DVT, likely   Allergies  Allergen Reactions  . Sulfa Antibiotics Hives    Patient Measurements: Height: 5\' 8"  (172.7 cm) Weight: 182 lb 1.6 oz (82.6 kg) IBW/kg (Calculated) : 68.4 Vital Signs: Temp: 98.5 F (36.9 C) (01/23 2000) Temp Source: Oral (01/23 1933) BP: 138/73 mmHg (01/23 2000) Pulse Rate: 96 (01/23 2000)  Labs:  Recent Labs  04/18/14 1745 04/18/14 1913 04/19/14 0350  HGB 13.4  --   --   HCT 34.7*  --   --   PLT 193  --   --   LABPROT 12.0  --   --   INR 0.87  --   --   HEPARINUNFRC  --   --  0.70  CREATININE 1.16 1.17 1.14    Estimated Creatinine Clearance: 67.7 mL/min (by C-G formula based on Cr of 1.14).  Assessment: 64 y/o M on heparin for possible DVT. First HL is 0.7. Other labs as above.   Goal of Therapy:  Heparin level 0.3-0.7 units/ml Monitor platelets by anticoagulation protocol: Yes   Plan:  -Continue heparin at 1000 units/hr -1000 HL -Daily CBC/HL -Monitor for bleeding  Narda Bonds 04/19/2014,4:55 AM

## 2014-04-19 NOTE — Progress Notes (Signed)
VASCULAR LAB PRELIMINARY  PRELIMINARY  PRELIMINARY  PRELIMINARY  *Right lower extremity venous duplexcompleted.    Preliminary report:  Right:  No evidence of DVT, superficial thrombosis, or Baker's cyst.  Lavi Sheehan, RVTS 04/19/2014, 1:58 PM

## 2014-04-19 NOTE — Progress Notes (Signed)
ANTICOAGULATION CONSULT NOTE - Follow Up Consult  Pharmacy Consult for Heparin  Indication: DVT, likely   Allergies  Allergen Reactions  . Sulfa Antibiotics Hives    Patient Measurements: Height: 5\' 8"  (172.7 cm) Weight: 182 lb 1.6 oz (82.6 kg) IBW/kg (Calculated) : 68.4 Vital Signs: Temp: 98.4 F (36.9 C) (01/24 0500) BP: 101/63 mmHg (01/24 0847) Pulse Rate: 88 (01/24 0847)  Labs:  Recent Labs  04/18/14 1745 04/18/14 1913 04/19/14 0350 04/19/14 1010  HGB 13.4  --  12.2*  --   HCT 34.7*  --  32.3*  --   PLT 193  --  194  --   LABPROT 12.0  --   --   --   INR 0.87  --   --   --   HEPARINUNFRC  --   --  0.70 0.52  CREATININE 1.16 1.17 1.14  --     Estimated Creatinine Clearance: 67.7 mL/min (by C-G formula based on Cr of 1.14).  Assessment: 64 y/o M admitted on 04/18/2014 started on heparin for possible DVT. Confirmatory HL remains therapeutic at 0.52 with slight decrease from first HL which could have reflected bolus effect. H/H slight trend down, plt remain wnl and stable with no reported significant s/s bleeding.   Goal of Therapy:  Heparin level 0.3-0.7 units/ml Monitor platelets by anticoagulation protocol: Yes   Plan:  -Continue heparin at 1000 units/hr -Daily CBC/HL -Monitor for bleeding and f/u results of doppler  Jasmene Goswami K. Velva Harman, PharmD Clinical Pharmacist - Resident Pager: [phone] Pharmacy: [phone] 04/19/2014 11:08 AM

## 2014-04-19 NOTE — H&P (Addendum)
Terry Munoz is an 64 y.o. male.   Chief Complaint: Painful right leg HPI: Terry Munoz is a 64 year old Caucasian male with a history of COPD with a ~100 pack year history of smoking with continued tobacco use, hyperlipidemia, hypertension, and diabetes who presents for evaluation of  right leg pain. He is accompanied by his wife at the bedside. I had seen him about 2 weeks ago in the outpatient basis when he was initially referred for dyspnea evaluation. I had suspected chronic severe PAD non-limb threatening. Due to worsening pain and swelling in his legs, he presented to the ED and admitted for further evaluation of rest pain that has been chronic (> 2 weeks).  No ulceration, but foot has remained chronically purpulish and painful. He was scheduled for PV angiogram tomorrow.  He states he has had shortness of breath for over a year which has been attributed to COPD. I He denies PND, orthopnea, or edema, although he states he has begun sleeping with an extra pillow within the past few weeks. He states his symptoms have improved significantly since being on furosemide. He reports an occasional sharp chest pain, typically at rest, but denies any pain with exertion. No chest pain, palpitations, recent weight change.  Past Medical History  Diagnosis Date  . Hypertension   . Diabetes mellitus without complication   . COPD (chronic obstructive pulmonary disease)   . Otitis media   . Sinusitis   . Insomnia   . Osteoarthritis   . Mouth ulcers   . Kidney stones     Past Surgical History  Procedure Laterality Date  . Dental surgery    . Hernia repair    . Lithotomy    . Neck surgery      No family history on file. Social History:  reports that he has been smoking Cigarettes.  He has been smoking about 2.00 packs per day. He has never used smokeless tobacco. He reports that he drinks about 14.4 oz of alcohol per week. He reports that he does not use illicit drugs.  Allergies:   Allergies  Allergen Reactions  . Sulfa Antibiotics Hives    Medications Prior to Admission  Medication Sig Dispense Refill  . albuterol (PROVENTIL) (2.5 MG/3ML) 0.083% nebulizer solution Take 2.5 mg by nebulization every 6 (six) hours as needed for wheezing or shortness of breath.    Marland Kitchen amitriptyline (ELAVIL) 50 MG tablet Take 50 mg by mouth at bedtime.    Marland Kitchen aspirin 81 MG tablet Take 81 mg by mouth daily.    Marland Kitchen atorvastatin (LIPITOR) 40 MG tablet Take 40 mg by mouth daily.    . celecoxib (CELEBREX) 200 MG capsule Take 200 mg by mouth 2 (two) times daily as needed for mild pain.    Marland Kitchen clopidogrel (PLAVIX) 75 MG tablet Take 75 mg by mouth daily.    . Fluticasone-Salmeterol (ADVAIR) 250-50 MCG/DOSE AEPB Inhale 1 puff into the lungs 2 (two) times daily.    . furosemide (LASIX) 40 MG tablet Take 20 mg by mouth daily.    Marland Kitchen HYDROcodone-acetaminophen (NORCO/VICODIN) 5-325 MG per tablet Take 1 tablet by mouth every 6 (six) hours as needed for moderate pain.    Marland Kitchen insulin glargine (LANTUS) 100 UNIT/ML injection Inject 25 Units into the skin daily.    . metoprolol tartrate (LOPRESSOR) 25 MG tablet Take 25 mg by mouth 2 (two) times daily.    Marland Kitchen olmesartan-hydrochlorothiazide (BENICAR HCT) 40-12.5 MG per tablet Take 1 tablet by mouth daily.    Marland Kitchen  oxyCODONE-acetaminophen (PERCOCET/ROXICET) 5-325 MG per tablet Take 1 tablet by mouth every 4 (four) hours as needed for severe pain.    . potassium chloride (MICRO-K) 10 MEQ CR capsule Take 10 mEq by mouth daily.    . sucralfate (CARAFATE) 1 G tablet Take 1 tablet (1 g total) by mouth 4 (four) times daily -  with meals and at bedtime. 30 tablet 0  . zolpidem (AMBIEN) 10 MG tablet Take 10 mg by mouth at bedtime as needed for sleep.        Review of Systems - Negative but as stated in [place]  Blood pressure 101/63, pulse 88, temperature 98.4 F (36.9 C), temperature source Oral, resp. rate 20, height 5' 8"  (1.727 m), weight 82.6 kg (182 lb 1.6 oz), SpO2 93 %.    General Mental Status- Alert. General Appearance- Cooperative and Appears stated age. Not in acute distress. Orientation- Oriented X3. Build & Nutrition- Moderately built.   Head and Neck: No JVD ThyroidGland Characteristics- no palpable nodules. no palpable enlargement.   Chest and Lung Exam: Palpation:Tender- No chest wall tenderness. Prolonged expiration- Both Lung Fields. Adventitious sounds:Expiratory wheeze- Both Lung Fields.   Cardiovascular Inspection:Jugular vein- Right- No Distention. Auscultation:Heart Sounds- S1 WNL, S2 WNL and No gallop present. Note: Distant Murmurs & Other Heart Sounds: Murmur:- No murmur.   Abdomen: Palpation/Percussion:Tenderness- Right Upper Quadrant. Liver:Lower border- 4 cm below costal margin. Auscultation:Auscultation of the abdomen reveals - Bowel sounds normal.   Peripheral Vascular: Both the legs are warm. Sensation preserved. Lower Extremity:Inspection- Right- Cyanotic nailbeds and Erythematous (lower leg). Bilateral- Loss of hair and Pigmented (dark pigmentation). No Varicose veins. Palpation:Tenderness- Right- Exquisitely tender. Edema: Left- No edema. Right- 1-2 Plus pitting below knee edema. Femoral pulse:- Left- Normal. Right- Normal. Popliteal pulse:- Left- Normal. Right- Absent. Dorsalis pedis pulse- Bilateral- Absent. Posterior tibial pulse- Left- 2+. Right- Absent. Carotid arteries:- Left- No Carotid bruit. Right- No Carotid bruit. Abdomen- No prominent abdominal aortic pulsation or epigastric bruit.   NeurologicMotor:- Grossly intact without any focal deficits.   Musculoskeletal Left Lower Extremity - normal range of motion without pain. Right Lower Extremity- normal range of motion with pain.  Results for orders placed or performed during the hospital encounter of 04/18/14 (from the past 48 hour(s))  CBC with Differential/Platelet     Status: Abnormal   Collection Time:  04/18/14  5:45 PM  Result Value Ref Range   WBC 10.8 (H) 4.0 - 10.5 K/uL   RBC 3.88 (L) 4.22 - 5.81 MIL/uL   Hemoglobin 13.4 13.0 - 17.0 g/dL   HCT 34.7 (L) 39.0 - 52.0 %   MCV 89.4 78.0 - 100.0 fL   MCH 34.5 (H) 26.0 - 34.0 pg   MCHC 38.6 (H) 30.0 - 36.0 g/dL    Comment: SPHEROCYTES SEEN ON SMEAR   RDW 12.4 11.5 - 15.5 %   Platelets 193 150 - 400 K/uL   Neutrophils Relative % 81 (H) 43 - 77 %   Neutro Abs 8.7 (H) 1.7 - 7.7 K/uL    Comment: CORRECTED ON 01/23 AT 1935: PREVIOUSLY REPORTED AS 9.1   Lymphocytes Relative 10 (L) 12 - 46 %   Lymphs Abs 1.1 0.7 - 4.0 K/uL   Monocytes Relative 8 3 - 12 %   Monocytes Absolute 0.9 0.1 - 1.0 K/uL   Eosinophils Relative 1 0 - 5 %   Eosinophils Absolute 0.1 0.0 - 0.7 K/uL   Basophils Relative 0 0 - 1 %   Basophils Absolute 0.0 0.0 -  0.1 K/uL  Basic metabolic panel     Status: Abnormal   Collection Time: 04/18/14  5:45 PM  Result Value Ref Range   Sodium 118 (LL) 135 - 145 mmol/L    Comment: REPEATED TO VERIFY CRITICAL RESULT CALLED TO, READ BACK BY AND VERIFIED WITH: J.CREWS 04/18/14 @1845  BY V.WILKINS    Potassium 4.0 3.5 - 5.1 mmol/L   Chloride 76 (L) 96 - 112 mmol/L   CO2 28 19 - 32 mmol/L   Glucose, Bld 71 70 - 99 mg/dL   BUN 12 6 - 23 mg/dL   Creatinine, Ser 1.16 0.50 - 1.35 mg/dL   Calcium 9.1 8.4 - 10.5 mg/dL   GFR calc non Af Amer 64 (L) >90 mL/min   GFR calc Af Amer 75 (L) >90 mL/min    Comment: (NOTE) The eGFR has been calculated using the CKD EPI equation. This calculation has not been validated in all clinical situations. eGFR's persistently <90 mL/min signify possible Chronic Kidney Disease.    Anion gap 14 5 - 15  Protime-INR     Status: None   Collection Time: 04/18/14  5:45 PM  Result Value Ref Range   Prothrombin Time 12.0 11.6 - 15.2 seconds   INR 0.87 0.00 - 1.76  Basic metabolic panel     Status: Abnormal   Collection Time: 04/18/14  7:13 PM  Result Value Ref Range   Sodium 119 (LL) 135 - 145 mmol/L     Comment: REPEATED TO VERIFY CRITICAL RESULT CALLED TO, READ BACK BY AND VERIFIED WITH: [facility] 2021 [id number] M[place]AU[place]    Potassium 4.6 3.5 - 5.1 mmol/L   Chloride 77 (L) 96 - 112 mmol/L   CO2 31 19 - 32 mmol/L   Glucose, Bld 71 70 - 99 mg/dL   BUN 13 6 - 23 mg/dL   Creatinine, Ser 1.17 0.50 - 1.35 mg/dL   Calcium 9.2 8.4 - 10.5 mg/dL   GFR calc non Af Amer 64 (L) >90 mL/min   GFR calc Af Amer 74 (L) >90 mL/min    Comment: (NOTE) The eGFR has been calculated using the CKD EPI equation. This calculation has not been validated in all clinical situations. eGFR's persistently <90 mL/min signify possible Chronic Kidney Disease.    Anion gap 11 5 - 15  Urinalysis, Routine w reflex microscopic     Status: Abnormal   Collection Time: 04/18/14  7:26 PM  Result Value Ref Range   Color, Urine YELLOW YELLOW   APPearance CLEAR CLEAR   Specific Gravity, Urine 1.009 1.005 - 1.030   pH 6.0 5.0 - 8.0   Glucose, UA NEGATIVE NEGATIVE mg/dL   Hgb urine dipstick NEGATIVE NEGATIVE   Bilirubin Urine NEGATIVE NEGATIVE   Ketones, ur 15 (A) NEGATIVE mg/dL   Protein, ur NEGATIVE NEGATIVE mg/dL   Urobilinogen, UA 1.0 0.0 - 1.0 mg/dL   Nitrite NEGATIVE NEGATIVE   Leukocytes, UA NEGATIVE NEGATIVE    Comment: MICROSCOPIC NOT DONE ON URINES WITH NEGATIVE PROTEIN, BLOOD, LEUKOCYTES, NITRITE, OR GLUCOSE <1000 mg/dL.  Glucose, capillary     Status: Abnormal   Collection Time: 04/18/14  9:31 PM  Result Value Ref Range   Glucose-Capillary 62 (L) 70 - 99 mg/dL  Glucose, capillary     Status: Abnormal   Collection Time: 04/19/14  3:22 AM  Result Value Ref Range   Glucose-Capillary 147 (H) 70 - 99 mg/dL  Heparin level (unfractionated)     Status: None   Collection Time: 04/19/14  3:50  AM  Result Value Ref Range   Heparin Unfractionated 0.70 0.30 - 0.70 IU/mL    Comment:        IF HEPARIN RESULTS ARE BELOW EXPECTED VALUES, AND PATIENT DOSAGE HAS BEEN CONFIRMED, SUGGEST FOLLOW UP TESTING OF ANTITHROMBIN III  LEVELS.   CBC     Status: Abnormal   Collection Time: 04/19/14  3:50 AM  Result Value Ref Range   WBC 9.3 4.0 - 10.5 K/uL   RBC 3.61 (L) 4.22 - 5.81 MIL/uL   Hemoglobin 12.2 (L) 13.0 - 17.0 g/dL   HCT 32.3 (L) 39.0 - 52.0 %   MCV 89.5 78.0 - 100.0 fL   MCH 33.8 26.0 - 34.0 pg   MCHC >37.0 (H) 30.0 - 36.0 g/dL    Comment: RULED OUT INTERFERING SUBSTANCES   RDW 12.3 11.5 - 15.5 %   Platelets 194 150 - 400 K/uL    Comment: REPEATED TO VERIFY  Basic metabolic panel     Status: Abnormal   Collection Time: 04/19/14  3:50 AM  Result Value Ref Range   Sodium 119 (LL) 135 - 145 mmol/L    Comment: REPEATED TO VERIFY CRITICAL RESULT CALLED TO, READ BACK BY AND VERIFIED WITH: TSOUTIS J,RN 04/19/14 0436 WAYK    Potassium 3.8 3.5 - 5.1 mmol/L    Comment: DELTA CHECK NOTED   Chloride 77 (L) 96 - 112 mmol/L   CO2 29 19 - 32 mmol/L   Glucose, Bld 136 (H) 70 - 99 mg/dL   BUN 11 6 - 23 mg/dL   Creatinine, Ser 1.14 0.50 - 1.35 mg/dL   Calcium 8.6 8.4 - 10.5 mg/dL   GFR calc non Af Amer 66 (L) >90 mL/min   GFR calc Af Amer 76 (L) >90 mL/min    Comment: (NOTE) The eGFR has been calculated using the CKD EPI equation. This calculation has not been validated in all clinical situations. eGFR's persistently <90 mL/min signify possible Chronic Kidney Disease.    Anion gap 13 5 - 15  Glucose, capillary     Status: Abnormal   Collection Time: 04/19/14  7:42 AM  Result Value Ref Range   Glucose-Capillary 100 (H) 70 - 99 mg/dL  Heparin level (unfractionated)     Status: None   Collection Time: 04/19/14 10:10 AM  Result Value Ref Range   Heparin Unfractionated 0.52 0.30 - 0.70 IU/mL    Comment:        IF HEPARIN RESULTS ARE BELOW EXPECTED VALUES, AND PATIENT DOSAGE HAS BEEN CONFIRMED, SUGGEST FOLLOW UP TESTING OF ANTITHROMBIN III LEVELS.    No results found.  Labs:   Lab Results  Component Value Date   WBC 9.3 04/19/2014   HGB 12.2* 04/19/2014   HCT 32.3* 04/19/2014   MCV 89.5  04/19/2014   PLT 194 04/19/2014    Recent Labs Lab 04/19/14 0350  NA 119*  K 3.8  CL 77*  CO2 29  BUN 11  CREATININE 1.14  CALCIUM 8.6  GLUCOSE 136*   Lab Results  Component Value Date   TROPONINI <0.30 02/22/2013     EKG: 04/18/2014: NSR @ 83/min. Normal axis, poor R progression, CRO anterior infarct, OLD. No ischemia.  Outpatient procedures:  Echocardiogram 04/14/2014:  Left ventricle cavity is normal in size. Normal global wall motion. Normal diastolic filling pattern. Visual EF is 55-60%. Calculated EF 51%. Trace mitral regurgitation. Trace tricuspid regurgitation. Trace pulmonic regurgitation. Essentially normal echocardiogram.  Lexiscan myoview stress test 04/13/2014: 1. The resting electrocardiogram demonstrated  normal sinus rhythm, normal resting conduction, poor R progression, cannot r/o anterior infarct, old. No resting arrhythmias and normal rest repolarization.  Stress EKG is nondiagnostic for ischemia as it is a Pharmacologic stress test using Lexiscan infusion. Stress symptoms included dyspnea, dizziness. 2. Myocardial perfusion imaging is normal. Overall left ventricular systolic function was normal without regional wall motion abnormalities. The left ventricular ejection fraction was 74%.  Lower extremity arterial duplex 04/09/2014: Right distal SFA occlusion with trickle flow below the knee. This exam reveals critically decreased perfusion of the right lower extremity with ABI 0.08, noted at the post tibial artery level. This exam reveals normal perfusion of the left lower extremity with ABI 1.00. Consider further work up including angiography/CT angiogram to further evaluate.  Assessment/Plan 1. Claudication with severe rest pain right leg. Chronic non-limb threatening ischemia right worse than the left. 2. Diabetes mellitus with peripheral arterial disease 3. Hypertension 4. Hyperlipidemia 5. Tobacco use disorder 6. COPD  Recommendation: I have met the family  and the patient and discussed again regarding peripheral angiography. They're aware of the risks, benefits and alternatives to peripheral angiography. Again discussed regarding smoking cessation. Son and wife present at the bedside and all questions were answered. They're aware of less than 1% risk of death, limb loss, need for urgent surgical revascularization, bleeding, infection and renal failure but not limited to these.  Laverda Page, MD 04/19/2014, 11:46 AM [facility]. [place] Pager: [phone] Office: [phone] If no answer: Cell:  [phone]

## 2014-04-20 ENCOUNTER — Inpatient Hospital Stay (HOSPITAL_COMMUNITY): Payer: Medicare Other

## 2014-04-20 ENCOUNTER — Ambulatory Visit (HOSPITAL_COMMUNITY): Admission: RE | Admit: 2014-04-20 | Payer: Medicare Other | Source: Ambulatory Visit | Admitting: Cardiology

## 2014-04-20 ENCOUNTER — Encounter (HOSPITAL_COMMUNITY): Admission: EM | Disposition: A | Discharge status: Home Health | Source: Home / Self Care | Attending: Internal Medicine

## 2014-04-20 DIAGNOSIS — I10 Essential (primary) hypertension: Secondary | ICD-10-CM | POA: Diagnosis present

## 2014-04-20 DIAGNOSIS — M79604 Pain in right leg: Secondary | ICD-10-CM | POA: Diagnosis not present

## 2014-04-20 DIAGNOSIS — E119 Type 2 diabetes mellitus without complications: Secondary | ICD-10-CM

## 2014-04-20 DIAGNOSIS — I739 Peripheral vascular disease, unspecified: Principal | ICD-10-CM

## 2014-04-20 DIAGNOSIS — Z720 Tobacco use: Secondary | ICD-10-CM | POA: Diagnosis present

## 2014-04-20 DIAGNOSIS — E871 Hypo-osmolality and hyponatremia: Secondary | ICD-10-CM

## 2014-04-20 DIAGNOSIS — R918 Other nonspecific abnormal finding of lung field: Secondary | ICD-10-CM | POA: Diagnosis not present

## 2014-04-20 DIAGNOSIS — F10231 Alcohol dependence with withdrawal delirium: Secondary | ICD-10-CM | POA: Diagnosis not present

## 2014-04-20 DIAGNOSIS — F10931 Alcohol use, unspecified with withdrawal delirium: Secondary | ICD-10-CM | POA: Diagnosis not present

## 2014-04-20 LAB — URINALYSIS W MICROSCOPIC (NOT AT ARMC)
Bilirubin Urine: NEGATIVE
Glucose, UA: NEGATIVE mg/dL
Hgb urine dipstick: NEGATIVE
Ketones, ur: 15 mg/dL — AB
LEUKOCYTES UA: NEGATIVE
Nitrite: NEGATIVE
PH: 7 (ref 5.0–8.0)
Protein, ur: NEGATIVE mg/dL
Specific Gravity, Urine: 1.005 (ref 1.005–1.030)
Urobilinogen, UA: 1 mg/dL (ref 0.0–1.0)

## 2014-04-20 LAB — CBC
HEMATOCRIT: 31.8 % — AB (ref 39.0–52.0)
Hemoglobin: 11.8 g/dL — ABNORMAL LOW (ref 13.0–17.0)
MCH: 33.6 pg (ref 26.0–34.0)
MCV: 90.6 fL (ref 78.0–100.0)
PLATELETS: 171 10*3/uL (ref 150–400)
RBC: 3.51 MIL/uL — AB (ref 4.22–5.81)
RDW: 12.4 % (ref 11.5–15.5)
WBC: 8.8 10*3/uL (ref 4.0–10.5)

## 2014-04-20 LAB — GLUCOSE, CAPILLARY
Glucose-Capillary: 133 mg/dL — ABNORMAL HIGH (ref 70–99)
Glucose-Capillary: 117 mg/dL — ABNORMAL HIGH (ref 70–99)
Glucose-Capillary: 114 mg/dL — ABNORMAL HIGH (ref 70–99)
Glucose-Capillary: 114 mg/dL — ABNORMAL HIGH (ref 70–99)

## 2014-04-20 LAB — BASIC METABOLIC PANEL
Anion gap: 10 (ref 5–15)
BUN: 10 mg/dL (ref 6–23)
CHLORIDE: 84 mmol/L — AB (ref 96–112)
CO2: 29 mmol/L (ref 19–32)
CREATININE: 1.03 mg/dL (ref 0.50–1.35)
Calcium: 8.6 mg/dL (ref 8.4–10.5)
GFR calc Af Amer: 86 mL/min — ABNORMAL LOW (ref >90)
GFR, EST NON AFRICAN AMERICAN: 74 mL/min — AB (ref >90)
Glucose, Bld: 139 mg/dL — ABNORMAL HIGH (ref 70–99)
Potassium: 4 mmol/L (ref 3.5–5.1)
SODIUM: 123 mmol/L — AB (ref 135–145)

## 2014-04-20 LAB — HEPARIN LEVEL (UNFRACTIONATED): Heparin Unfractionated: 0.61 IU/mL (ref 0.30–0.70)

## 2014-04-20 LAB — OSMOLALITY: Osmolality: 253 mOsm/kg — ABNORMAL LOW (ref 275–300)

## 2014-04-20 LAB — HEMOGLOBIN A1C
Hgb A1c MFr Bld: 9.4 % — ABNORMAL HIGH (ref <5.7)
Mean Plasma Glucose: 223 mg/dL — ABNORMAL HIGH (ref <117)

## 2014-04-20 LAB — LIPID PANEL
CHOLESTEROL: 115 mg/dL (ref 0–200)
HDL: 53 mg/dL (ref >39)
LDL CALC: 51 mg/dL (ref 0–99)
Total CHOL/HDL Ratio: 2.2 RATIO
Triglycerides: 57 mg/dL (ref <150)
VLDL: 11 mg/dL (ref 0–40)

## 2014-04-20 LAB — MRSA PCR SCREENING: MRSA BY PCR: NEGATIVE

## 2014-04-20 LAB — SODIUM, URINE, RANDOM: SODIUM UR: 47 mmol/L

## 2014-04-20 SURGERY — ANGIOGRAM, LOWER EXTREMITY
Anesthesia: LOCAL

## 2014-04-20 MED ORDER — LORAZEPAM 2 MG/ML IJ SOLN
2.0000 mg | INTRAMUSCULAR | Status: DC | PRN
Start: 2014-04-20 — End: 2014-04-20
  Filled 2014-04-20: qty 1

## 2014-04-20 MED ORDER — FOLIC ACID 5 MG/ML IJ SOLN
1.0000 mg | Freq: Every day | INTRAMUSCULAR | Status: DC
  Administered 2014-04-21: 1 mg via INTRAVENOUS
  Filled 2014-04-20 (×2): qty 0.2

## 2014-04-20 MED ORDER — LORAZEPAM 2 MG/ML IJ SOLN
INTRAMUSCULAR | Status: AC
  Administered 2014-04-20: 2 mg via INTRAVENOUS
  Filled 2014-04-20: qty 1

## 2014-04-20 MED ORDER — HEPARIN SODIUM (PORCINE) 5000 UNIT/ML IJ SOLN
5000.0000 [IU] | Freq: Three times a day (TID) | INTRAMUSCULAR | Status: DC
  Administered 2014-04-21 – 2014-04-24 (×10): 5000 [IU] via SUBCUTANEOUS
  Filled 2014-04-20 (×9): qty 1

## 2014-04-20 MED ORDER — THIAMINE HCL 100 MG/ML IJ SOLN
100.0000 mg | Freq: Every day | INTRAMUSCULAR | Status: DC
  Administered 2014-04-21: 100 mg via INTRAVENOUS
  Filled 2014-04-20: qty 2

## 2014-04-20 MED ORDER — LORAZEPAM 2 MG/ML IJ SOLN
3.0000 mg | INTRAMUSCULAR | Status: DC | PRN
  Administered 2014-04-20: 2 mg via INTRAVENOUS

## 2014-04-20 MED ORDER — LORAZEPAM 2 MG/ML IJ SOLN
2.0000 mg | INTRAMUSCULAR | Status: DC | PRN
  Administered 2014-04-20 – 2014-04-21 (×7): 2 mg via INTRAVENOUS
  Filled 2014-04-20 (×8): qty 1

## 2014-04-20 MED ORDER — SODIUM CHLORIDE 0.9 % IV SOLN
INTRAVENOUS | Status: DC
  Administered 2014-04-21: 03:00:00 via INTRAVENOUS

## 2014-04-20 MED ORDER — INSULIN ASPART 100 UNIT/ML ~~LOC~~ SOLN
0.0000 [IU] | SUBCUTANEOUS | Status: DC
  Administered 2014-04-21: 2 [IU] via SUBCUTANEOUS
  Administered 2014-04-21: 5 [IU] via SUBCUTANEOUS
  Administered 2014-04-21: 2 [IU] via SUBCUTANEOUS
  Administered 2014-04-21 – 2014-04-22 (×3): 5 [IU] via SUBCUTANEOUS
  Administered 2014-04-22: 8 [IU] via SUBCUTANEOUS

## 2014-04-20 MED ORDER — THIAMINE HCL 100 MG/ML IJ SOLN
Freq: Once | INTRAVENOUS | Status: AC
  Administered 2014-04-20: 12:00:00 via INTRAVENOUS
  Filled 2014-04-20: qty 1000

## 2014-04-20 MED ORDER — CHLORHEXIDINE GLUCONATE CLOTH 2 % EX PADS
6.0000 | MEDICATED_PAD | Freq: Once | CUTANEOUS | Status: DC
Start: 2014-04-20 — End: 2014-04-24

## 2014-04-20 MED ORDER — ADULT MULTIVITAMIN W/MINERALS CH: 1.0000 | ORAL_TABLET | Freq: Every day | ORAL | Status: DC

## 2014-04-20 NOTE — Progress Notes (Addendum)
Subjective:  Patient was scheduled for peripheral arteriogram this morning, procedure got canceled due to patient being confused. Patient's son and wife present at the bedside.  Objective:  Vital Signs in the last 24 hours: Temp:  [96.6 F (35.9 C)-98.2 F (36.8 C)] 97.8 F (36.6 C) (01/25 0400) Pulse Rate:  [52-91] 88 (01/25 0400) Resp:  [16-18] 18 (01/25 0400) BP: (112-128)/(66-76) 113/76 mmHg (01/25 0400) SpO2:  [94 %-95 %] 94 % (01/25 0400) Weight:  [84.823 kg (187 lb)] 84.823 kg (187 lb) (01/25 0400)  Intake/Output from previous day: 01/24 0701 - 01/25 0700 In: 110 [I.V.:110] Out: 450 [Urine:450]  Physical Exam:  GeneralMental Status- Patient is confused. Going through DTs   Head and Neck: No JVD ThyroidGland Characteristics- no palpable nodules. no palpable enlargement.   Chest and Lung Exam: Palpation:Tender- No chest wall tenderness. Prolonged expiration- Both Lung Fields. Adventitious sounds:Expiratory wheeze- Both Lung Fields.   Cardiovascular Inspection:Jugular vein- Right- No Distention. Auscultation:Heart Sounds- S1 WNL, S2 WNL and No gallop present. Note: Distant Murmurs & Other Heart Sounds: Murmur:- No murmur.   Abdomen: Palpation/Percussion:Tenderness- Right Upper Quadrant. Liver:Lower border- 4 cm below costal margin. Auscultation:Auscultation of the abdomen reveals - Bowel sounds normal.   Peripheral Vascular: Both the legs are warm. Sensation preserved. Lower Extremity:Inspection- Right- Cyanotic nailbeds and Erythematous (lower leg). Bilateral- Loss of hair and Pigmented (dark pigmentation). No Varicose veins. Palpation:Tenderness- Right- Exquisitely tender. Edema: Left- No edema. Right- 1-2 Plus pitting below knee edema. Femoral pulse:- Left- Normal. Right- Normal. Popliteal pulse:- Left- Normal. Right- Absent. Dorsalis pedis pulse- Bilateral- Absent. Posterior tibial pulse- Left- 2+. Right- Absent. Carotid  arteries:- Left- No Carotid bruit. Right- No Carotid bruit. Abdomen- No prominent abdominal aortic pulsation or epigastric bruit.    Lab Results: BMP  Recent Labs  04/18/14 1913 04/19/14 0350 04/20/14 0414  NA 119* 119* 123*  K 4.6 3.8 4.0  CL 77* 77* 84*  CO2 31 29 29   GLUCOSE 71 136* 139*  BUN 13 11 10   CREATININE 1.17 1.14 1.03  CALCIUM 9.2 8.6 8.6  GFRNONAA 64* 66* 74*  GFRAA 74* 76* 86*    CBC  Recent Labs Lab 04/18/14 1745  04/20/14 0414  WBC 10.8*  < > 8.8  RBC 3.88*  < > 3.51*  HGB 13.4  < > 11.8*  HCT 34.7*  < > 31.8*  PLT 193  < > 171  MCV 89.4  < > 90.6  MCH 34.5*  < > 33.6  MCHC 38.6*  < > >37.0*  RDW 12.4  < > 12.4  LYMPHSABS 1.1  --   --   MONOABS 0.9  --   --   EOSABS 0.1  --   --   BASOSABS 0.0  --   --   < > = values in this interval not displayed.   CHOLESTEROL  Recent Labs  04/20/14 0414  CHOL 115    Hepatic Function Panel No results for input(s): PROT, ALBUMIN, AST, ALT, ALKPHOS, BILITOT, BILIDIR, IBILI in the last 8760 hours.  Imaging: Prelim LE venous duplex Negative for DVT right leg  Cardiac Studies:  EKG: 04/18/2014: NSR @ 83/min. Normal axis, poor R progression, CRO anterior infarct, OLD. No ischemia.  Outpatient procedures:  Echocardiogram 04/14/2014: Left ventricle cavity is normal in size. Normal global wall motion. Normal diastolic filling pattern. Visual EF is 55-60%. Calculated EF 51%. Trace mitral regurgitation. Trace tricuspid regurgitation. Trace pulmonic regurgitation. Essentially normal echocardiogram.  Lexiscan myoview stress test 04/13/2014: 1. The resting  electrocardiogram demonstrated normal sinus rhythm, normal resting conduction, poor R progression, cannot r/o anterior infarct, old. No resting arrhythmias and normal rest repolarization. Stress EKG is nondiagnostic for ischemia as it is a Pharmacologic stress test using Lexiscan infusion. Stress symptoms included dyspnea, dizziness. 2. Myocardial  perfusion imaging is normal. Overall left ventricular systolic function was normal without regional wall motion abnormalities. The left ventricular ejection fraction was 74%.  Lower extremity arterial duplex 04/09/2014: Right distal SFA occlusion with trickle flow below the knee. This exam reveals critically decreased perfusion of the right lower extremity with ABI 0.08, noted at the post tibial artery level. This exam reveals normal perfusion of the left lower extremity with ABI 1.00. Consider further work up including angiography/CT angiogram to further evaluate.   Assessment/Plan:  1. Acute delirium tremens, patient confused and unable to perform peripheral angiography this morning. Procedure canceled. 2. Severe peripheral arterial disease involving the right leg, chronic non-limb threatening ischemia. 3. Hypertension 4. Diabetes mellitus type 2 controlled 5. Tobacco use disorder 6. Hyponatremia ? Chroinc vs acute. Present on admission, will try to obtain out patient labs  Recommendation: I will discontinue IV heparin, patient has chronic lymphedema ischemia, non-limb threatening, I will have hospitalist to take over the care for now until his DTs a controlled. We will consider elective angiography at a later date. For now we'll continue medical therapy.    Laverda Page, M.D. 04/20/2014, 9:27 AM [facility] Pager: [phone] Office: [phone] If no answer: [phone]

## 2014-04-20 NOTE — Consult Note (Signed)
[facility]   Patient Demographics  Terry Munoz, is a 64 y.o. male  CSN: [id number]  MRN: [id number]  DOB - 1950/07/25  Admit date - 04/18/2014  Admitting Physician Laverda Page, MD  Outpatient Primary MD for the patient is Tula Nakayama  LOS - 2   Consult requested in the Hospital by Laverda Page, MD, On 04/20/2014    Reason for consult : Agitation, confusion, alcohol withdrawal delirium.    Assessment & Plan  Acute encephalopathy/ alcohol withdrawal delirium - The main reason is alcohol withdrawal delirium, other contributing factor might be hyponatremia, and hospital delirium. - Patient is known to have history of heavy alcohol abuse, which he did not admit to on admission, wife confirms he drinks 6-7 large glasses every day of mixed drinks. - Patient will be started on step down CIWA protocol - We'll start on banana bag, IV thiamine, IV multivitamin, and folate.  Hyponatremia - Unclear etiology, possible alcohol potomania, unclear what is his baseline as no recent labs. - We'll check urinalysis, urine sodium, urine sodium, and serum osmolality, - Will discontinue hydrochlorothiazide - We'll aim for gradual correction . - Continue with IV normal saline. - Check a chest x-ray given his history of heavy smoking  Severe peripheral artery disease, with chronic low dictation and non-limb threatening ischemia - Management as per cardiology, plan is for angiogram 1 patient is stable. - Continue on aspirin and Plavix when able to take oral intake. - Patient is off heparin drip giving negative venous Doppler for DVT, and no angiogram planned in the near future.  Diabetes mellitus - Continue with insulin sliding-scale will change from 3 times a day before meals and bedtime to every 6 hours - We'll hold long-acting insulin given patient is not taking any oral intake secondary to mental status, and his hypoglycemia  earlier today.  Essential hypertension - Resume back on home medication was able to tolerate, will continue hydrochlorothiazide  Tobacco abuse - Will start on nicotine patch    Active Problems:   Claudication of right lower extremity   Peripheral artery disease   Alcohol withdrawal delirium   Diabetes mellitus, stable   Essential hypertension   Hyponatremia   Tobacco abuse     DVT Prophylaxis -Heparin -  Please review my Orders  Family Communication - Plan of care including tests being ordered have been discussed with the wife at bedside who indicate understanding and agree with the plan.  Thank you for the consult, we will follow the patient with you in the Hospital.   With History of -  Active Problems:   Claudication of right lower extremity   Peripheral artery disease   Alcohol withdrawal delirium   Diabetes mellitus, stable   Essential hypertension   Hyponatremia   Tobacco abuse   Past Medical History  Diagnosis Date  . Hypertension   . Diabetes mellitus without complication   . COPD (chronic obstructive pulmonary disease)   . Otitis media   . Sinusitis   . Insomnia   . Osteoarthritis   . Mouth ulcers   . Kidney stones      Past Surgical History  Procedure Laterality Date  . Dental surgery    . Hernia repair    . Lithotomy    . Neck surgery      Past Surgical History  Procedure Laterality Date  . Dental surgery    . Hernia repair    . Lithotomy    . Neck surgery  HPI:-  Terry Munoz QAS:[id number],NLG:[id number] is a 64 y.o. male, with history of COPD, round 100 pack year history of smoking, still using tobacco, hyperlipidemia, hypertension, and diabetes, as well as history of alcohol abuse, she denied initially on admission, admitted under cardiology service( Dr. Einar Gip ) for right lower extremity non-limb threatening ischemia/claudication, for angiogram and further evaluation, there is no ulceration of the foot, patient was started  initially on heparin drip until venous Doppler came back negative for DVT, plan for angiogram was canceled as patient developed confusion, agitation, went into delirium, wife came and verified that patient is a heavy drinker, drinks average 6 - 7 mixed drinks of hard liquor on a daily basis, as well patient was noticed to have hyponatremia on admission of 118, unfortunately there is no previous lab C of this is acute or chronic. Specialists requested to take over patient care giving his multiple medical comorbidities, and his angiogram has been postponed currently to patient is more stable.      Review of Systems    In addition to the HPI above,  No Fever-chills, No Headache, No changes with Vision or hearing, No problems swallowing food or Liquids, No Chest pain, Cough or Shortness of Breath, No Abdominal pain, No Nausea or Vommitting, Bowel movements are regular, No Blood in stool or Urine, No dysuria, No new skin rashes or bruises, No new joints pains-aches,  No new weakness, tingling, numbness in any extremity, No recent weight gain or loss, No polyuria, polydypsia or polyphagia, No significant Mental Stressors.  A full 10 point review of systems was obtained except as dictated above all other review of systems are negative.     Social History History  Substance Use Topics  . Smoking status: Current Every Day Smoker -- 2.00 packs/day    Types: Cigarettes  . Smokeless tobacco: Never Used  . Alcohol Use: 14.4 oz/week    24 Cans of beer per week     Family History No family history on file.   Prior to Admission medications   Medication Sig Start Date End Date Taking? Authorizing Provider  albuterol (PROVENTIL) (2.5 MG/3ML) 0.083% nebulizer solution Take 2.5 mg by nebulization every 6 (six) hours as needed for wheezing or shortness of breath.   Yes Historical Provider, MD  amitriptyline (ELAVIL) 50 MG tablet Take 50 mg by mouth at bedtime.   Yes Historical Provider, MD   aspirin 81 MG tablet Take 81 mg by mouth daily.   Yes Historical Provider, MD  atorvastatin (LIPITOR) 40 MG tablet Take 40 mg by mouth daily.   Yes Historical Provider, MD  celecoxib (CELEBREX) 200 MG capsule Take 200 mg by mouth 2 (two) times daily as needed for mild pain.   Yes Historical Provider, MD  clopidogrel (PLAVIX) 75 MG tablet Take 75 mg by mouth daily.   Yes Historical Provider, MD  Fluticasone-Salmeterol (ADVAIR) 250-50 MCG/DOSE AEPB Inhale 1 puff into the lungs 2 (two) times daily.   Yes Historical Provider, MD  furosemide (LASIX) 40 MG tablet Take 20 mg by mouth daily.   Yes Historical Provider, MD  HYDROcodone-acetaminophen (NORCO/VICODIN) 5-325 MG per tablet Take 1 tablet by mouth every 6 (six) hours as needed for moderate pain.   Yes Historical Provider, MD  insulin glargine (LANTUS) 100 UNIT/ML injection Inject 25 Units into the skin daily.   Yes Historical Provider, MD  metoprolol tartrate (LOPRESSOR) 25 MG tablet Take 25 mg by mouth 2 (two) times daily.   Yes Historical Provider, MD  olmesartan-hydrochlorothiazide (BENICAR HCT) 40-12.5 MG per tablet Take 1 tablet by mouth daily.   Yes Historical Provider, MD  oxyCODONE-acetaminophen (PERCOCET/ROXICET) 5-325 MG per tablet Take 1 tablet by mouth every 4 (four) hours as needed for severe pain.   Yes Historical Provider, MD  potassium chloride (MICRO-K) 10 MEQ CR capsule Take 10 mEq by mouth daily.   Yes Historical Provider, MD  sucralfate (CARAFATE) 1 G tablet Take 1 tablet (1 g total) by mouth 4 (four) times daily -  with meals and at bedtime. 02/22/13  Yes Virgel Manifold, MD  zolpidem (AMBIEN) 10 MG tablet Take 10 mg by mouth at bedtime as needed for sleep.   Yes Historical Provider, MD    Allergies  Allergen Reactions  . Sulfa Antibiotics Hives     Physical Exam   Vitals  Blood pressure 113/76, pulse 88, temperature 97.8 F (36.6 C), temperature source Axillary, resp. rate 18, height 5\' 8"  (1.727 m), weight 84.823 kg  (187 lb), SpO2 94 %.   1. General frail elderly male lying in bed in NAD,    2. Confused, lethargic,  3. No F.N deficits, ALL C.Nerves Intact, Strength 5/5 all 4 extremities, Sensation intact all   4 extremities, Plantars down going.  4. Ears and Eyes appear Normal, Conjunctivae clear, PERRLA. Moist Oral Mucosa.  5. Supple Neck, No JVD, No cervical lymphadenopathy appriciated, No Carotid Bruits.  6. Symmetrical Chest wall movement, Good air movement bilaterally, CTAB.  7. RRR, No Gallops, Rubs or Murmurs, No Parasternal Heave.  8. Positive Bowel Sounds, Abdomen Soft, Non tender, No organomegaly appriciated, No rebound -guarding or rigidity.  9.  Multiple skin bruises secondary to multiple falls, right lower extremity diminished pulses, palpable by Doppler, no skin ulceration, or gangrene in right lower extremity.  10. Good muscle tone,  joints appear normal , no effusions, Normal ROM.  11. No Palpable Lymph Nodes in Neck or Axillae   Data Review  CBC  Recent Labs Lab 04/18/14 1745 04/19/14 0350 04/20/14 0414  WBC 10.8* 9.3 8.8  HGB 13.4 12.2* 11.8*  HCT 34.7* 32.3* 31.8*  PLT 193 194 171  MCV 89.4 89.5 90.6  MCH 34.5* 33.8 33.6  MCHC 38.6* >37.0* >37.0*  RDW 12.4 12.3 12.4  LYMPHSABS 1.1  --   --   MONOABS 0.9  --   --   EOSABS 0.1  --   --   BASOSABS 0.0  --   --     Chemistries   Recent Labs Lab 04/18/14 1745 04/18/14 1913 04/19/14 0350 04/20/14 0414  NA 118* 119* 119* 123*  K 4.0 4.6 3.8 4.0  CL 76* 77* 77* 84*  CO2 28 31 29 29   GLUCOSE 71 71 136* 139*  BUN 12 13 11 10   CREATININE 1.16 1.17 1.14 1.03  CALCIUM 9.1 9.2 8.6 8.6   ------------------------------------------------------------------------------------------------------------------ estimated creatinine clearance is 75.8 mL/min (by C-G formula based on Cr of 1.03). ------------------------------------------------------------------------------------------------------------------ No results  for input(s): HGBA1C in the last 72 hours. ------------------------------------------------------------------------------------------------------------------  Recent Labs  04/20/14 0414  CHOL 115  HDL 53  LDLCALC 51  TRIG 57  CHOLHDL 2.2   ------------------------------------------------------------------------------------------------------------------ No results for input(s): TSH, T4TOTAL, T3FREE, THYROIDAB in the last 72 hours.  Invalid input(s): FREET3 ------------------------------------------------------------------------------------------------------------------ No results for input(s): VITAMINB12, FOLATE, FERRITIN, TIBC, IRON, RETICCTPCT in the last 72 hours.  Coagulation profile  Recent Labs Lab 04/18/14 1745  INR 0.87    No results for input(s): DDIMER in the last 72 hours.  Cardiac  Enzymes No results for input(s): CKMB, TROPONINI, MYOGLOBIN in the last 168 hours.  Invalid input(s): CK ------------------------------------------------------------------------------------------------------------------ Invalid input(s): POCBNP  ------------------------------------------------------------------------------------------------------------------- Micro Results No results found for this or any previous visit (from the past 240 hour(s)). ------------------------------------------------------------------------------------------------------------------ Radiology Reports No results found.  Scheduled Meds: . amitriptyline  50 mg Oral QHS  . aspirin EC  81 mg Oral Daily  . atorvastatin  40 mg Oral q1800  . clopidogrel  75 mg Oral Daily  . [START ON 2/94/7654] folic acid  1 mg Intravenous Daily  . [START ON 04/21/2014] heparin subcutaneous  5,000 Units Subcutaneous 3 times per day  . insulin aspart  0-15 Units Subcutaneous TID WC  . irbesartan  300 mg Oral Daily  . metoprolol tartrate  25 mg Oral BID  . mometasone-formoterol  2 puff Inhalation BID  . [START ON 04/21/2014]  multivitamin with minerals  1 tablet Oral Daily  . nicotine  21 mg Transdermal Daily  . potassium chloride  10 mEq Oral Once  . banana bag IV 1000 mL   Intravenous Once  . sodium chloride  3 mL Intravenous Q12H  . sucralfate  1 g Oral TID WC & HS  . [START ON 04/21/2014] thiamine  100 mg Intravenous Daily   Continuous Infusions:  PRN Meds:.sodium chloride, acetaminophen **OR** acetaminophen, albuterol, alum & mag hydroxide-simeth, HYDROcodone-acetaminophen, HYDROmorphone (DILAUDID) injection, LORazepam, ondansetron **OR** ondansetron (ZOFRAN) IV, oxyCODONE-acetaminophen, sodium chloride, zolpidem  Antibiotics.  Anti-infectives    None      Time Spent in minutes 70 minutes  Earlisha Sharples M.D on 04/20/2014 at 10:16 AM  [facility] Office  [phone]

## 2014-04-20 NOTE — Progress Notes (Signed)
Hypoglycemic Event  CBG: 58  Treatment: 15 GM carbohydrate snack   Symptoms: Nervous/irritable  Follow-up CBG: Time: 2108 CBG Result: 84  Possible Reasons for Event: Inadequate meal intake  Comments/MD notified: BG improved after giving 8oz grape juice    Roxan Hockey A  Remember to initiate Hypoglycemia Order Set & complete

## 2014-04-20 NOTE — Care Management Note (Addendum)
    Page 1 of 2   04/24/2014     2:34:38 PM CARE MANAGEMENT NOTE 04/24/2014  Patient:  Terry Munoz, Terry Munoz   Account Number:  [id number]  Date Initiated:  04/20/2014  Documentation initiated by:  GRAVES-BIGELOW,Veralyn Lopp  Subjective/Objective Assessment:   Pt admitted for Painful right leg. On CIWA Protocol.     Action/Plan:   CM to monitor for disposition needs.   Anticipated DC Date:  04/20/2014   Anticipated DC Plan:  [place]  CM consult      PAC Choice  [place]   Choice offered to / List presented to:  C-1 Patient   DME arranged  [place]      DME agency  [place] arranged  [place].   Status of service:  Completed, signed off Medicare Important Message given?  YES (If response is "NO", the following Medicare IM given date fields will be blank) Date Medicare IM given:  04/22/2014 Medicare IM given by:  GRAVES-BIGELOW,Mayes Sangiovanni Date Additional Medicare IM given:  04/24/2014 Additional Medicare IM given by:  [place]  Discharge Disposition:  [place]  Per UR Regulation:  Reviewed for med. necessity/level of care/duration of stay  If discussed at [place] of Stay Meetings, dates discussed:   04/23/2014    Comments:  04-24-14 [place], RN, BSN [phone] CM did speak with pt in regards to DME and home needs. Pt chose [facility] for both. Referral made for [facility] servceis and SOC to begin within 24-48 hrs post d/c. DME to be delivered to room before d/c. CM did provide information to pt in regards to ramp. Pt can go by [employer] for [facility] in [place] or contact [employer] on Aging @ [phone]. NO further needs at this time.  04-22-14 1541 Jacqlyn Krauss, RN,BSN [phone] Claudication of right lower extremity- may plan for angiogram 04-23-14.  CM to continue to monitor for disposition needs.

## 2014-04-20 NOTE — Progress Notes (Signed)
UR Completed Mayson Sterbenz Graves-Bigelow, RN,BSN [phone]  

## 2014-04-20 NOTE — Progress Notes (Signed)
Inpatient Diabetes Program Recommendations  AACE/ADA: New Consensus Statement on Inpatient Glycemic Control (2013)  Target Ranges:  Prepandial:   less than 140 mg/dL      Peak postprandial:   less than 180 mg/dL (1-2 hours)      Critically ill patients:  140 - 180 mg/dL   Results for ABRAHIM, SARGENT (MRN [id number]) as of 04/20/2014 11:49  Ref. Range 04/19/2014 07:42 04/19/2014 11:45 04/19/2014 16:47 04/19/2014 20:20 04/19/2014 21:08 04/20/2014 07:44 04/20/2014 11:25  Glucose-Capillary Latest Range: 70-99 mg/dL 100 (H) 143 (H) 140 (H) 58 (L) 84 133 (H) 117 (H)    Diabetes history: DM 2 Outpatient Diabetes medications: Lantus 25 units Daily Current orders for Inpatient glycemic control: Novolog 0-15 units TID  Inpatient Diabetes Program Recommendations Insulin - Basal: Noted patient had a 58 mg/dl CBG on 1/24 at 2020. Please consider restarting basal insulin at 50% of home dose, 12-15 units Daily (pt may have issues with hyperglycemia if he does not receive basal insulin today).  Correction (SSI): While patient is not eating, please consider changing current Novolog correction scale to Q4hr coverage (Novolog 0-15 units).   Thanks,  Tama Headings RN, MSN, [facility] Inpatient Diabetes Coordinator Team Pager [phone]

## 2014-04-21 ENCOUNTER — Inpatient Hospital Stay (HOSPITAL_COMMUNITY): Payer: Medicare Other

## 2014-04-21 ENCOUNTER — Encounter (HOSPITAL_COMMUNITY): Payer: Self-pay | Admitting: General Practice

## 2014-04-21 DIAGNOSIS — J449 Chronic obstructive pulmonary disease, unspecified: Secondary | ICD-10-CM | POA: Diagnosis not present

## 2014-04-21 DIAGNOSIS — J690 Pneumonitis due to inhalation of food and vomit: Secondary | ICD-10-CM

## 2014-04-21 DIAGNOSIS — J984 Other disorders of lung: Secondary | ICD-10-CM | POA: Diagnosis not present

## 2014-04-21 DIAGNOSIS — J441 Chronic obstructive pulmonary disease with (acute) exacerbation: Secondary | ICD-10-CM

## 2014-04-21 DIAGNOSIS — E119 Type 2 diabetes mellitus without complications: Secondary | ICD-10-CM

## 2014-04-21 LAB — BLOOD GAS, ARTERIAL
ACID-BASE DEFICIT: 2.6 mmol/L — AB (ref 0.0–2.0)
BICARBONATE: 21.3 meq/L (ref 20.0–24.0)
Drawn by: 313941
FIO2: 0.21 %
O2 Saturation: 86.3 %
Patient temperature: 98.3
TCO2: 22.3 mmol/L (ref 0–100)
pCO2 arterial: 33.8 mmHg — ABNORMAL LOW (ref 35.0–45.0)
pH, Arterial: 7.414 (ref 7.350–7.450)
pO2, Arterial: 51.1 mmHg — ABNORMAL LOW (ref 80.0–100.0)

## 2014-04-21 LAB — COMPREHENSIVE METABOLIC PANEL
ALT: 45 U/L (ref 0–53)
ANION GAP: 11 (ref 5–15)
AST: 60 U/L — ABNORMAL HIGH (ref 0–37)
Albumin: 3.1 g/dL — ABNORMAL LOW (ref 3.5–5.2)
Alkaline Phosphatase: 90 U/L (ref 39–117)
BILIRUBIN TOTAL: 0.7 mg/dL (ref 0.3–1.2)
BUN: 5 mg/dL — ABNORMAL LOW (ref 6–23)
CO2: 24 mmol/L (ref 19–32)
Calcium: 8.6 mg/dL (ref 8.4–10.5)
Chloride: 91 mmol/L — ABNORMAL LOW (ref 96–112)
Creatinine, Ser: 0.95 mg/dL (ref 0.50–1.35)
GFR calc Af Amer: >90 mL/min (ref >90)
GFR, EST NON AFRICAN AMERICAN: 85 mL/min — AB (ref >90)
GLUCOSE: 125 mg/dL — AB (ref 70–99)
POTASSIUM: 3.6 mmol/L (ref 3.5–5.1)
SODIUM: 126 mmol/L — AB (ref 135–145)
TOTAL PROTEIN: 6.4 g/dL (ref 6.0–8.3)

## 2014-04-21 LAB — CBC
HCT: 31.2 % — ABNORMAL LOW (ref 39.0–52.0)
Hemoglobin: 11.5 g/dL — ABNORMAL LOW (ref 13.0–17.0)
MCH: 33.2 pg (ref 26.0–34.0)
MCHC: 36.9 g/dL — ABNORMAL HIGH (ref 30.0–36.0)
MCV: 90.2 fL (ref 78.0–100.0)
Platelets: 164 10*3/uL (ref 150–400)
RBC: 3.46 MIL/uL — AB (ref 4.22–5.81)
RDW: 12.4 % (ref 11.5–15.5)
WBC: 9.4 10*3/uL (ref 4.0–10.5)

## 2014-04-21 LAB — GLUCOSE, CAPILLARY
GLUCOSE-CAPILLARY: 240 mg/dL — AB (ref 70–99)
Glucose-Capillary: 112 mg/dL — ABNORMAL HIGH (ref 70–99)
Glucose-Capillary: 116 mg/dL — ABNORMAL HIGH (ref 70–99)
Glucose-Capillary: 129 mg/dL — ABNORMAL HIGH (ref 70–99)
Glucose-Capillary: 139 mg/dL — ABNORMAL HIGH (ref 70–99)
Glucose-Capillary: 201 mg/dL — ABNORMAL HIGH (ref 70–99)

## 2014-04-21 LAB — OSMOLALITY, URINE: Osmolality, Ur: 179 mOsm/kg — ABNORMAL LOW (ref 390–1090)

## 2014-04-21 LAB — BRAIN NATRIURETIC PEPTIDE: B NATRIURETIC PEPTIDE 5: 61.9 pg/mL (ref 0.0–100.0)

## 2014-04-21 MED ORDER — FUROSEMIDE 10 MG/ML IJ SOLN
INTRAMUSCULAR | Status: AC
  Administered 2014-04-21: 40 mg via INTRAVENOUS
  Filled 2014-04-21: qty 4

## 2014-04-21 MED ORDER — METHYLPREDNISOLONE SODIUM SUCC 125 MG IJ SOLR
125.0000 mg | Freq: Once | INTRAMUSCULAR | Status: AC
  Administered 2014-04-21: 125 mg via INTRAVENOUS

## 2014-04-21 MED ORDER — SODIUM CHLORIDE 0.9 % IV SOLN
3.0000 g | Freq: Four times a day (QID) | INTRAVENOUS | Status: DC
  Administered 2014-04-21 – 2014-04-23 (×8): 3 g via INTRAVENOUS
  Filled 2014-04-21 (×11): qty 3

## 2014-04-21 MED ORDER — IPRATROPIUM-ALBUTEROL 0.5-2.5 (3) MG/3ML IN SOLN
3.0000 mL | RESPIRATORY_TRACT | Status: DC
  Administered 2014-04-21 – 2014-04-22 (×5): 3 mL via RESPIRATORY_TRACT
  Filled 2014-04-21 (×5): qty 3

## 2014-04-21 MED ORDER — FUROSEMIDE 10 MG/ML IJ SOLN
40.0000 mg | Freq: Once | INTRAMUSCULAR | Status: AC
  Administered 2014-04-21: 40 mg via INTRAVENOUS

## 2014-04-21 MED ORDER — METHYLPREDNISOLONE SODIUM SUCC 125 MG IJ SOLR
60.0000 mg | Freq: Two times a day (BID) | INTRAMUSCULAR | Status: DC
  Administered 2014-04-21 – 2014-04-22 (×2): 60 mg via INTRAVENOUS
  Filled 2014-04-21 (×2): qty 2

## 2014-04-21 MED ORDER — ENALAPRILAT 1.25 MG/ML IV SOLN
0.6250 mg | Freq: Four times a day (QID) | INTRAVENOUS | Status: DC
  Administered 2014-04-21 – 2014-04-22 (×3): 0.625 mg via INTRAVENOUS
  Filled 2014-04-21 (×9): qty 0.5

## 2014-04-21 MED ORDER — METHYLPREDNISOLONE SODIUM SUCC 125 MG IJ SOLR
INTRAMUSCULAR | Status: AC
  Administered 2014-04-21: 125 mg via INTRAVENOUS
  Filled 2014-04-21: qty 2

## 2014-04-21 MED ORDER — CETYLPYRIDINIUM CHLORIDE 0.05 % MT LIQD
7.0000 mL | Freq: Two times a day (BID) | OROMUCOSAL | Status: DC
  Administered 2014-04-21 – 2014-04-23 (×5): 7 mL via OROMUCOSAL

## 2014-04-21 NOTE — Progress Notes (Signed)
Subjective:  Patient still confused, being treated for DTs.  Objective:  Vital Signs in the last 24 hours: Temp:  [97.6 F (36.4 C)-98.2 F (36.8 C)] 97.9 F (36.6 C) (01/26 0417) Pulse Rate:  [98-109] 109 (01/26 0744) Resp:  [19-27] 27 (01/26 0744) BP: (132-171)/(72-90) 158/90 mmHg (01/26 0744) SpO2:  [95 %-97 %] 95 % (01/26 0744) Weight:  [82.101 kg (181 lb)] 82.101 kg (181 lb) (01/26 0417)  Intake/Output from previous day: 01/25 0701 - 01/26 0700 In: 1325 [I.V.:325] Out: 2225 [Urine:2225]  Physical Exam:  GeneralMental Status- Patient is confused. Going through DTs   Head and Neck: No JVD ThyroidGland Characteristics- no palpable nodules. no palpable enlargement.   Chest and Lung Exam: Palpation: Prolonged expiration- Both Lung Fields. Adventitious sounds:Expiratory wheeze- Both Lung Fields. Diffuse scattered rhonchi present   Cardiovascular Inspection:Jugular vein- Right- No Distention. Auscultation:Heart Sounds- S1 WNL, S2 WNL and No gallop present. Note: Distant Murmurs & Other Heart Sounds: Murmur:- No murmur.   Abdomen: Palpation/Percussion:Tenderness- Right Upper Quadrant. Liver:Lower border- 4 cm below costal margin. Auscultation:Auscultation of the abdomen reveals - Bowel sounds normal.   Peripheral Vascular: Both the legs are warm. Sensation preserved. Lower Extremity:Inspectionboth legs look pink, No Varicose veins. Palpation:Both the legs are warm, Edema: Left- No edema. Right- 1 Plus pitting below knee edema. Femoral pulse:- Left- Normal. Right- Normal. Popliteal pulse:- Left- Normal. Right- Absent. Dorsalis pedis pulse- Bilateral- Absent. Posterior tibial pulse- Left- 2+. Right- Absent. Carotid arteries:- Left- No Carotid bruit. Right- No Carotid bruit. Abdomen- No prominent abdominal aortic pulsation or epigastric bruit.  Lab Results: BMP  Recent Labs  04/19/14 0350 04/20/14 0414 04/21/14 0405  NA 119*  123* 126*  K 3.8 4.0 3.6  CL 77* 84* 91*  CO2 29 29 24   GLUCOSE 136* 139* 125*  BUN 11 10 5*  CREATININE 1.14 1.03 0.95  CALCIUM 8.6 8.6 8.6  GFRNONAA 66* 74* 85*  GFRAA 76* 86* >90    CBC  Recent Labs Lab 04/18/14 1745  04/21/14 0405  WBC 10.8*  < > 9.4  RBC 3.88*  < > 3.46*  HGB 13.4  < > 11.5*  HCT 34.7*  < > 31.2*  PLT 193  < > 164  MCV 89.4  < > 90.2  MCH 34.5*  < > 33.2  MCHC 38.6*  < > 36.9*  RDW 12.4  < > 12.4  LYMPHSABS 1.1  --   --   MONOABS 0.9  --   --   EOSABS 0.1  --   --   BASOSABS 0.0  --   --   < > = values in this interval not displayed.   CHOLESTEROL  Recent Labs  04/20/14 0414  CHOL 115    Hepatic Function Panel  Recent Labs  04/21/14 0405  PROT 6.4  ALBUMIN 3.1*  AST 60*  ALT 45  ALKPHOS 90  BILITOT 0.7    Imaging: Prelim LE venous duplex Negative for DVT right leg  Cardiac Studies:  EKG: 04/18/2014: NSR @ 83/min. Normal axis, poor R progression, CRO anterior infarct, OLD. No ischemia.  Outpatient procedures:  Echocardiogram 04/14/2014: Left ventricle cavity is normal in size. Normal global wall motion. Normal diastolic filling pattern. Visual EF is 55-60%. Calculated EF 51%. Trace mitral regurgitation. Trace tricuspid regurgitation. Trace pulmonic regurgitation. Essentially normal echocardiogram.  Lexiscan myoview stress test 04/13/2014: 1. The resting electrocardiogram demonstrated normal sinus rhythm, normal resting conduction, poor R progression, cannot r/o anterior infarct, old. No resting arrhythmias and normal  rest repolarization. Stress EKG is nondiagnostic for ischemia as it is a Pharmacologic stress test using Lexiscan infusion. Stress symptoms included dyspnea, dizziness. 2. Myocardial perfusion imaging is normal. Overall left ventricular systolic function was normal without regional wall motion abnormalities. The left ventricular ejection fraction was 74%.  Lower extremity arterial duplex 04/09/2014: Right distal  SFA occlusion with trickle flow below the knee. This exam reveals critically decreased perfusion of the right lower extremity with ABI 0.08, noted at the post tibial artery level. This exam reveals normal perfusion of the left lower extremity with ABI 1.00. Consider further work up including angiography/CT angiogram to further evaluate.   Assessment/Plan:  1. Acute delirium tremens, patient confused and unable to perform peripheral angiography this morning. Procedure canceled. 2. Severe peripheral arterial disease involving the right leg, chronic non-limb threatening ischemia. 3. Hypertension 4. Diabetes mellitus type 2 controlled 5. Tobacco use disorder 6. Hyponatremia ? Chroinc vs acute. Present on admission.   Labs 02/02/2014: BUN 9, serum creatinine 1.0. Serum sodium was 140.  Total cholesterol 236, triglycerides 43, HDL 88, LDL 139. TSH normal. CBC normal.  Recommendation: I have  updated the labs from number of 2015. Hyponatremia probably due to on call withdrawal. Urine analysis reveals low specific gravity along with low osmolality which is concordant with low serum hospitality. Hence SIADH is unlikely. At this point I will sign off, we will reevaluate his PAD at a later date when patient is medically stable.    Laverda Page, M.D. 04/21/2014, 9:44 AM [facility] Pager: [phone] Office: [phone] If no answer: [phone]

## 2014-04-21 NOTE — Progress Notes (Signed)
Upon arrival patient hard to arouse and having snoring respirations. Patient given breathing treatment due to wheezing and increase WOB. RN notified that patient may need BiPAP. RN to notify MD. Will continue to monitor.

## 2014-04-21 NOTE — Progress Notes (Signed)
[facility] PROGRESS NOTE Late entry  Terry Munoz HDQ:[id number] DOB: 1950-10-28 DOA: 04/18/2014 PCP: Tula Nakayama  Assessment/Plan: Chart reviewed. Called by RN about wheezing and respiratory distress.  Started midmorning.  Principal Problem:   Alcohol withdrawal delirium: remains confused. Last ativan dose at 0600. Continue CIWA protocol,  Active Problems: Acute hypoxic respiratory failure: Secondary to COPD exacerbation and possible aspiration pneumonia. Will schedule duo nebs. Received a dose of Solu-Medrol. Will give 60 twice a day. Unasyn. Titrate oxygen for sats above 90%.  Also given stat Lasix prior to chest x-ray results. ProBNP is pending.  Suspect an element of sleep apnea, but patient has no diagnosis.    possible Aspiration pneumonia:  Patient currently very somnolent. Make nothing by mouth and limit oral medications for now.    COPD exacerbation:  See above.    Claudication of right lower extremity:  No workup until medically stable, potentially as an outpatient    Peripheral artery disease    Diabetes mellitus:  Continue sliding scale. On steroids may require addition of long-acting insulin. Hemoglobin A1c above 9    Essential hypertension:  Will give medications IV for now.    Hyponatremia:  Suspect multifactorial: Hydrochlorothiazide, possibly beer potomania. Will also check a TSH.    Tobacco abuse   Code Status:  full Family Communication:  Discussed with ex-wife at bedside Disposition Plan:  Continue step down monitoring  Consultants:  Cardiology: Einar Gip  Procedures:     Antibiotics:  unasyn 1/26 ->  HPI/Subjective: Unable.  Objective: Filed Vitals:   04/21/14 1211  BP:   Pulse:   Temp:   Resp: 16    Intake/Output Summary (Last 24 hours) at 04/21/14 1323 Last data filed at 04/21/14 1200  Gross per 24 hour  Intake   1328 ml  Output   2650 ml  Net  -1322 ml   Filed Weights   04/18/14 2000 04/20/14 0400 04/21/14  0417  Weight: 82.6 kg (182 lb 1.6 oz) 84.823 kg (187 lb) 82.101 kg (181 lb)    Exam:   General:  Somnolent with sonorous respirations.  Cardiovascular: Regular rate rhythm without murmurs gallops rubs  Respiratory: Prolonged expiratory phase with diffuse wheezing and abdominal breathing  Abdomen: Obese, soft, normal bowel sounds.  Ext: Some erythema noted distally about the right leg. No edema bilaterally.  Basic Metabolic Panel:  Recent Labs Lab 04/18/14 1745 04/18/14 1913 04/19/14 0350 04/20/14 0414 04/21/14 0405  NA 118* 119* 119* 123* 126*  K 4.0 4.6 3.8 4.0 3.6  CL 76* 77* 77* 84* 91*  CO2 28 31 29 29 24   GLUCOSE 71 71 136* 139* 125*  BUN 12 13 11 10  5*  CREATININE 1.16 1.17 1.14 1.03 0.95  CALCIUM 9.1 9.2 8.6 8.6 8.6   Liver Function Tests:  Recent Labs Lab 04/21/14 0405  AST 60*  ALT 45  ALKPHOS 90  BILITOT 0.7  PROT 6.4  ALBUMIN 3.1*   No results for input(s): LIPASE, AMYLASE in the last 168 hours. No results for input(s): AMMONIA in the last 168 hours. CBC:  Recent Labs Lab 04/18/14 1745 04/19/14 0350 04/20/14 0414 04/21/14 0405  WBC 10.8* 9.3 8.8 9.4  NEUTROABS 8.7*  --   --   --   HGB 13.4 12.2* 11.8* 11.5*  HCT 34.7* 32.3* 31.8* 31.2*  MCV 89.4 89.5 90.6 90.2  PLT 193 194 171 164   Cardiac Enzymes: No results for input(s): CKTOTAL, CKMB, CKMBINDEX, TROPONINI in the last 168 hours. BNP (last  3 results) No results for input(s): PROBNP in the last 8760 hours. CBG:  Recent Labs Lab 04/20/14 1640 04/20/14 1934 04/21/14 0052 04/21/14 0415 04/21/14 0752  GLUCAP 114* 114* 129* 116* 139*   ABG    Component Value Date/Time   PHART 7.414 04/21/2014 1214   PCO2ART 33.8* 04/21/2014 1214   PO2ART 51.1* 04/21/2014 1214   HCO3 21.3 04/21/2014 1214   TCO2 22.3 04/21/2014 1214   ACIDBASEDEF 2.6* 04/21/2014 1214   O2SAT 86.3 04/21/2014 1214    Recent Results (from the past 240 hour(s))  MRSA PCR Screening     Status: None    Collection Time: 04/20/14  6:32 PM  Result Value Ref Range Status   MRSA by PCR NEGATIVE NEGATIVE Final    Comment:        The GeneXpert MRSA Assay (FDA approved for NASAL specimens only), is one component of a comprehensive MRSA colonization surveillance program. It is not intended to diagnose MRSA infection nor to guide or monitor treatment for MRSA infections.      Studies: Dg Chest Port 1 View  04/21/2014   CLINICAL DATA:  COPD, possible aspiration, respiratory distress  EXAM: PORTABLE CHEST - 1 VIEW  COMPARISON:  04/20/2014  FINDINGS: There is hazy left lower lobe airspace disease which may reflect atelectasis versus aspiration pneumonia. There is no pleural effusion or pneumothorax. The heart and mediastinum are normal. The osseous structures are normal.  IMPRESSION: Hazy left lower lobe airspace disease which may reflect atelectasis versus pneumonia including aspiration.   Electronically Signed   By: Kathreen Devoid   On: 04/21/2014 13:10   Dg Chest Port 1 View  04/20/2014   CLINICAL DATA:  64 year old with hyponatremia and confusion.  EXAM: PORTABLE CHEST - 1 VIEW  COMPARISON:  03/25/2014  FINDINGS: Decreased lung markings in the upper lungs probably represent hyperinflation and emphysematous changes. There is vascular crowding in the lower lungs which may represent atelectasis. Heart size is normal. The trachea is midline. Surgical plates in the cervical spine.  IMPRESSION: Increased densities in the lower lungs probably represent atelectasis.   Electronically Signed   By: Markus Daft M.D.   On: 04/20/2014 10:52    Scheduled Meds: . amitriptyline  50 mg Oral QHS  . antiseptic oral rinse  7 mL Mouth Rinse BID  . aspirin EC  81 mg Oral Daily  . atorvastatin  40 mg Oral q1800  . Chlorhexidine Gluconate Cloth  6 each Topical Once  . clopidogrel  75 mg Oral Daily  . folic acid  1 mg Intravenous Daily  . heparin subcutaneous  5,000 Units Subcutaneous 3 times per day  . insulin aspart   0-15 Units Subcutaneous Q4H  . irbesartan  300 mg Oral Daily  . metoprolol tartrate  25 mg Oral BID  . mometasone-formoterol  2 puff Inhalation BID  . multivitamin with minerals  1 tablet Oral Daily  . nicotine  21 mg Transdermal Daily  . potassium chloride  10 mEq Oral Once  . sodium chloride  3 mL Intravenous Q12H  . sucralfate  1 g Oral TID WC & HS  . thiamine  100 mg Intravenous Daily   Continuous Infusions:   Critical care time: 45 minutes  [facility] [email or web address], password [facility] 04/21/2014, 1:23 PM  LOS: 3 days

## 2014-04-21 NOTE — Progress Notes (Signed)
ABG obtained and patients WOB is somewhat better at this time post breathing treatment.

## 2014-04-22 ENCOUNTER — Inpatient Hospital Stay (HOSPITAL_COMMUNITY): Payer: Medicare Other

## 2014-04-22 DIAGNOSIS — J449 Chronic obstructive pulmonary disease, unspecified: Secondary | ICD-10-CM | POA: Diagnosis not present

## 2014-04-22 LAB — BASIC METABOLIC PANEL
Anion gap: 15 (ref 5–15)
BUN: 11 mg/dL (ref 6–23)
CALCIUM: 8.8 mg/dL (ref 8.4–10.5)
CO2: 23 mmol/L (ref 19–32)
Chloride: 90 mmol/L — ABNORMAL LOW (ref 96–112)
Creatinine, Ser: 0.95 mg/dL (ref 0.50–1.35)
GFR calc Af Amer: >90 mL/min (ref >90)
GFR calc non Af Amer: 85 mL/min — ABNORMAL LOW (ref >90)
Glucose, Bld: 270 mg/dL — ABNORMAL HIGH (ref 70–99)
POTASSIUM: 3.3 mmol/L — AB (ref 3.5–5.1)
Sodium: 128 mmol/L — ABNORMAL LOW (ref 135–145)

## 2014-04-22 LAB — GLUCOSE, CAPILLARY
GLUCOSE-CAPILLARY: 290 mg/dL — AB (ref 70–99)
Glucose-Capillary: 216 mg/dL — ABNORMAL HIGH (ref 70–99)
Glucose-Capillary: 248 mg/dL — ABNORMAL HIGH (ref 70–99)
Glucose-Capillary: 273 mg/dL — ABNORMAL HIGH (ref 70–99)
Glucose-Capillary: 305 mg/dL — ABNORMAL HIGH (ref 70–99)
Glucose-Capillary: 306 mg/dL — ABNORMAL HIGH (ref 70–99)

## 2014-04-22 LAB — TSH: TSH: 0.542 u[IU]/mL (ref 0.350–4.500)

## 2014-04-22 MED ORDER — INSULIN ASPART 100 UNIT/ML ~~LOC~~ SOLN
4.0000 [IU] | Freq: Once | SUBCUTANEOUS | Status: AC
  Administered 2014-04-22: 4 [IU] via SUBCUTANEOUS

## 2014-04-22 MED ORDER — INSULIN ASPART 100 UNIT/ML ~~LOC~~ SOLN
0.0000 [IU] | Freq: Three times a day (TID) | SUBCUTANEOUS | Status: DC
  Administered 2014-04-22: 11 [IU] via SUBCUTANEOUS
  Administered 2014-04-22: 8 [IU] via SUBCUTANEOUS
  Administered 2014-04-23: 3 [IU] via SUBCUTANEOUS

## 2014-04-22 MED ORDER — POTASSIUM CHLORIDE CRYS ER 10 MEQ PO TBCR: 10.0000 meq | EXTENDED_RELEASE_TABLET | Freq: Once | ORAL | Status: DC

## 2014-04-22 MED ORDER — ATORVASTATIN CALCIUM 40 MG PO TABS
40.0000 mg | ORAL_TABLET | Freq: Every day | ORAL | Status: DC
  Administered 2014-04-22 – 2014-04-23 (×2): 40 mg via ORAL
  Filled 2014-04-22 (×2): qty 1

## 2014-04-22 MED ORDER — IRBESARTAN 75 MG PO TABS
75.0000 mg | ORAL_TABLET | Freq: Every day | ORAL | Status: DC
  Administered 2014-04-22 – 2014-04-24 (×3): 75 mg via ORAL
  Filled 2014-04-22 (×3): qty 1

## 2014-04-22 MED ORDER — PREDNISONE 20 MG PO TABS
40.0000 mg | ORAL_TABLET | Freq: Every day | ORAL | Status: DC
  Administered 2014-04-23: 40 mg via ORAL
  Filled 2014-04-22 (×2): qty 2

## 2014-04-22 MED ORDER — METOPROLOL TARTRATE 12.5 MG HALF TABLET
12.5000 mg | ORAL_TABLET | Freq: Two times a day (BID) | ORAL | Status: DC
  Administered 2014-04-22 – 2014-04-24 (×5): 12.5 mg via ORAL
  Filled 2014-04-22 (×5): qty 1

## 2014-04-22 MED ORDER — POTASSIUM CHLORIDE CRYS ER 20 MEQ PO TBCR
40.0000 meq | EXTENDED_RELEASE_TABLET | Freq: Two times a day (BID) | ORAL | Status: AC
  Administered 2014-04-22 – 2014-04-23 (×4): 40 meq via ORAL
  Filled 2014-04-22 (×4): qty 2

## 2014-04-22 MED ORDER — INSULIN GLARGINE 100 UNIT/ML ~~LOC~~ SOLN
25.0000 [IU] | Freq: Every day | SUBCUTANEOUS | Status: DC
  Administered 2014-04-22 – 2014-04-23 (×2): 25 [IU] via SUBCUTANEOUS
  Filled 2014-04-22 (×2): qty 0.25

## 2014-04-22 MED ORDER — SUCRALFATE 1 G PO TABS
1.0000 g | ORAL_TABLET | Freq: Three times a day (TID) | ORAL | Status: DC
  Administered 2014-04-22 – 2014-04-24 (×8): 1 g via ORAL
  Filled 2014-04-22 (×8): qty 1

## 2014-04-22 MED ORDER — FOLIC ACID 1 MG PO TABS
1.0000 mg | ORAL_TABLET | Freq: Every day | ORAL | Status: DC
  Administered 2014-04-22 – 2014-04-24 (×3): 1 mg via ORAL
  Filled 2014-04-22 (×3): qty 1

## 2014-04-22 MED ORDER — AMITRIPTYLINE HCL 25 MG PO TABS: 50.0000 mg | ORAL_TABLET | Freq: Every day | ORAL | Status: DC

## 2014-04-22 MED ORDER — IPRATROPIUM-ALBUTEROL 0.5-2.5 (3) MG/3ML IN SOLN
3.0000 mL | Freq: Four times a day (QID) | RESPIRATORY_TRACT | Status: DC
  Administered 2014-04-22 – 2014-04-23 (×3): 3 mL via RESPIRATORY_TRACT
  Filled 2014-04-22 (×3): qty 3

## 2014-04-22 MED ORDER — VITAMIN B-1 100 MG PO TABS
100.0000 mg | ORAL_TABLET | Freq: Every day | ORAL | Status: DC
  Administered 2014-04-22 – 2014-04-24 (×3): 100 mg via ORAL
  Filled 2014-04-22 (×3): qty 1

## 2014-04-22 MED ORDER — FUROSEMIDE 20 MG PO TABS: 20.0000 mg | ORAL_TABLET | Freq: Every day | ORAL | Status: DC

## 2014-04-22 MED ORDER — ALBUTEROL SULFATE (2.5 MG/3ML) 0.083% IN NEBU: 2.5000 mg | INHALATION_SOLUTION | RESPIRATORY_TRACT | Status: DC | PRN

## 2014-04-22 MED ORDER — OXYCODONE-ACETAMINOPHEN 5-325 MG PO TABS
1.0000 | ORAL_TABLET | ORAL | Status: DC | PRN
  Administered 2014-04-22 – 2014-04-23 (×4): 1 via ORAL
  Filled 2014-04-22 (×4): qty 1

## 2014-04-22 MED ORDER — FUROSEMIDE 20 MG PO TABS
20.0000 mg | ORAL_TABLET | Freq: Every day | ORAL | Status: DC
  Administered 2014-04-23 – 2014-04-24 (×2): 20 mg via ORAL
  Filled 2014-04-22 (×2): qty 1

## 2014-04-22 NOTE — Progress Notes (Signed)
[facility] PROGRESS NOTE  READE TREFZ VQX:[id number] DOB: Nov 23, 1950 DOA: 04/18/2014 PCP: Tula Nakayama  Assessment/Plan:  Principal Problem:   Alcohol withdrawal delirium: improved today. Still slightly tremulous. Resume diet. Will need SW consult for cessation counseling  Active Problems:  Acute hypoxic respiratory failure: much improved. Change to pred 40 qd. Continue duonebs, change to qid. BNP yesterday < 100    possible Aspiration pneumonia:  On unasyn. Will repeat CXR to confirm.    COPD exacerbation:  See above.    Claudication of right lower extremity:  Would observe today. May be able to proceed with workup per Dr. Einar Gip tomorrow if stable today. Will d/w Dr. Einar Gip    Peripheral artery disease    Diabetes mellitus:  Uncontrolled secondary to steroids.     Essential hypertension:  Will give medications IV for now.    Hyponatremia:  Suspect multifactorial: Hydrochlorothiazide, possibly beer potomania. Improving. TSH normal    Tobacco abuse  Hypokalemia: replete po   Code Status:  full Family Communication:  Discussed with wife at bedside Disposition Plan:  Continue step down monitoring  Consultants:  Cardiology: Einar Gip  Procedures:     Antibiotics:  unasyn 1/26 ->  HPI/Subjective: C/o leg pain and hunger. No dyspnea or cough. Admits to drinking a pint of liquor per day  Objective: Filed Vitals:   04/22/14 0800  BP: 149/70  Pulse: 115  Temp: 97.7 F (36.5 C)  Resp: 20    Intake/Output Summary (Last 24 hours) at 04/22/14 0915 Last data filed at 04/22/14 0800  Gross per 24 hour  Intake      3 ml  Output   2625 ml  Net  -2622 ml   Filed Weights   04/20/14 0400 04/21/14 0417 04/22/14 0400  Weight: 84.823 kg (187 lb) 82.101 kg (181 lb) 81.058 kg (178 lb 11.2 oz)    Exam:   General:  Alert. Oriented to place, year, situation, not date  Cardiovascular: Regular rate rhythm without murmurs gallops rubs  Respiratory: CTA  without WRR  Abdomen: Obese, soft, normal bowel sounds.  Ext: minimal erythema on left leg. No edema.  Basic Metabolic Panel:  Recent Labs Lab 04/18/14 1913 04/19/14 0350 04/20/14 0414 04/21/14 0405 04/22/14 0427  NA 119* 119* 123* 126* 128*  K 4.6 3.8 4.0 3.6 3.3*  CL 77* 77* 84* 91* 90*  CO2 31 29 29 24 23   GLUCOSE 71 136* 139* 125* 270*  BUN 13 11 10  5* 11  CREATININE 1.17 1.14 1.03 0.95 0.95  CALCIUM 9.2 8.6 8.6 8.6 8.8   Liver Function Tests:  Recent Labs Lab 04/21/14 0405  AST 60*  ALT 45  ALKPHOS 90  BILITOT 0.7  PROT 6.4  ALBUMIN 3.1*   No results for input(s): LIPASE, AMYLASE in the last 168 hours. No results for input(s): AMMONIA in the last 168 hours. CBC:  Recent Labs Lab 04/18/14 1745 04/19/14 0350 04/20/14 0414 04/21/14 0405  WBC 10.8* 9.3 8.8 9.4  NEUTROABS 8.7*  --   --   --   HGB 13.4 12.2* 11.8* 11.5*  HCT 34.7* 32.3* 31.8* 31.2*  MCV 89.4 89.5 90.6 90.2  PLT 193 194 171 164   Cardiac Enzymes: No results for input(s): CKTOTAL, CKMB, CKMBINDEX, TROPONINI in the last 168 hours. BNP (last 3 results) No results for input(s): PROBNP in the last 8760 hours. CBG:  Recent Labs Lab 04/21/14 1642 04/21/14 1955 04/22/14 0011 04/22/14 0411 04/22/14 0742  GLUCAP 201* 240* 216* 273* 248*  ABG    Component Value Date/Time   PHART 7.414 04/21/2014 1214   PCO2ART 33.8* 04/21/2014 1214   PO2ART 51.1* 04/21/2014 1214   HCO3 21.3 04/21/2014 1214   TCO2 22.3 04/21/2014 1214   ACIDBASEDEF 2.6* 04/21/2014 1214   O2SAT 86.3 04/21/2014 1214    Recent Results (from the past 240 hour(s))  MRSA PCR Screening     Status: None   Collection Time: 04/20/14  6:32 PM  Result Value Ref Range Status   MRSA by PCR NEGATIVE NEGATIVE Final    Comment:        The GeneXpert MRSA Assay (FDA approved for NASAL specimens only), is one component of a comprehensive MRSA colonization surveillance program. It is not intended to diagnose MRSA infection  nor to guide or monitor treatment for MRSA infections.      Studies: Dg Chest Port 1 View  04/21/2014   CLINICAL DATA:  COPD, possible aspiration, respiratory distress  EXAM: PORTABLE CHEST - 1 VIEW  COMPARISON:  04/20/2014  FINDINGS: There is hazy left lower lobe airspace disease which may reflect atelectasis versus aspiration pneumonia. There is no pleural effusion or pneumothorax. The heart and mediastinum are normal. The osseous structures are normal.  IMPRESSION: Hazy left lower lobe airspace disease which may reflect atelectasis versus pneumonia including aspiration.   Electronically Signed   By: Kathreen Devoid   On: 04/21/2014 13:10   Dg Chest Port 1 View  04/20/2014   CLINICAL DATA:  64 year old with hyponatremia and confusion.  EXAM: PORTABLE CHEST - 1 VIEW  COMPARISON:  03/25/2014  FINDINGS: Decreased lung markings in the upper lungs probably represent hyperinflation and emphysematous changes. There is vascular crowding in the lower lungs which may represent atelectasis. Heart size is normal. The trachea is midline. Surgical plates in the cervical spine.  IMPRESSION: Increased densities in the lower lungs probably represent atelectasis.   Electronically Signed   By: Markus Daft M.D.   On: 04/20/2014 10:52    Scheduled Meds: . ampicillin-sulbactam (UNASYN) IV  3 g Intravenous Q6H  . antiseptic oral rinse  7 mL Mouth Rinse BID  . aspirin EC  81 mg Oral Daily  . Chlorhexidine Gluconate Cloth  6 each Topical Once  . clopidogrel  75 mg Oral Daily  . enalaprilat  0.625 mg Intravenous 4 times per day  . folic acid  1 mg Intravenous Daily  . heparin subcutaneous  5,000 Units Subcutaneous 3 times per day  . insulin aspart  0-15 Units Subcutaneous Q4H  . ipratropium-albuterol  3 mL Nebulization Q4H  . methylPREDNISolone (SOLU-MEDROL) injection  60 mg Intravenous Q12H  . mometasone-formoterol  2 puff Inhalation BID  . nicotine  21 mg Transdermal Daily  . sodium chloride  3 mL Intravenous Q12H   . thiamine  100 mg Intravenous Daily   Continuous Infusions:   Critical care time: 35 minutes  [facility] [email or web address], password [facility] 04/22/2014, 9:15 AM  LOS: 4 days

## 2014-04-23 DIAGNOSIS — I482 Chronic atrial fibrillation: Secondary | ICD-10-CM | POA: Diagnosis not present

## 2014-04-23 DIAGNOSIS — I2609 Other pulmonary embolism with acute cor pulmonale: Secondary | ICD-10-CM | POA: Diagnosis not present

## 2014-04-23 DIAGNOSIS — I739 Peripheral vascular disease, unspecified: Secondary | ICD-10-CM | POA: Diagnosis not present

## 2014-04-23 LAB — BASIC METABOLIC PANEL
Anion gap: 6 (ref 5–15)
BUN: 11 mg/dL (ref 6–23)
CHLORIDE: 97 mmol/L (ref 96–112)
CO2: 29 mmol/L (ref 19–32)
Calcium: 9 mg/dL (ref 8.4–10.5)
Creatinine, Ser: 0.97 mg/dL (ref 0.50–1.35)
GFR calc Af Amer: >90 mL/min (ref >90)
GFR calc non Af Amer: 85 mL/min — ABNORMAL LOW (ref >90)
GLUCOSE: 205 mg/dL — AB (ref 70–99)
POTASSIUM: 4.3 mmol/L (ref 3.5–5.1)
SODIUM: 132 mmol/L — AB (ref 135–145)

## 2014-04-23 LAB — CBC
HEMATOCRIT: 30.9 % — AB (ref 39.0–52.0)
HEMOGLOBIN: 11.1 g/dL — AB (ref 13.0–17.0)
MCH: 32.8 pg (ref 26.0–34.0)
MCHC: 35.9 g/dL (ref 30.0–36.0)
MCV: 91.4 fL (ref 78.0–100.0)
PLATELETS: 202 10*3/uL (ref 150–400)
RBC: 3.38 MIL/uL — ABNORMAL LOW (ref 4.22–5.81)
RDW: 12.3 % (ref 11.5–15.5)
WBC: 14.6 10*3/uL — AB (ref 4.0–10.5)

## 2014-04-23 LAB — GLUCOSE, CAPILLARY
GLUCOSE-CAPILLARY: 176 mg/dL — AB (ref 70–99)
GLUCOSE-CAPILLARY: 243 mg/dL — AB (ref 70–99)
GLUCOSE-CAPILLARY: 285 mg/dL — AB (ref 70–99)
Glucose-Capillary: 159 mg/dL — ABNORMAL HIGH (ref 70–99)
Glucose-Capillary: 201 mg/dL — ABNORMAL HIGH (ref 70–99)
Glucose-Capillary: 246 mg/dL — ABNORMAL HIGH (ref 70–99)

## 2014-04-23 MED ORDER — AMITRIPTYLINE HCL 25 MG PO TABS
50.0000 mg | ORAL_TABLET | Freq: Every day | ORAL | Status: DC
  Administered 2014-04-23: 50 mg via ORAL
  Filled 2014-04-23: qty 2

## 2014-04-23 MED ORDER — INSULIN GLARGINE 100 UNIT/ML ~~LOC~~ SOLN
30.0000 [IU] | Freq: Every day | SUBCUTANEOUS | Status: DC
  Administered 2014-04-24: 30 [IU] via SUBCUTANEOUS
  Filled 2014-04-23: qty 0.3

## 2014-04-23 MED ORDER — INSULIN ASPART 100 UNIT/ML ~~LOC~~ SOLN
0.0000 [IU] | Freq: Three times a day (TID) | SUBCUTANEOUS | Status: DC
  Administered 2014-04-23: 4 [IU] via SUBCUTANEOUS
  Administered 2014-04-23: 7 [IU] via SUBCUTANEOUS
  Administered 2014-04-24: 11 [IU] via SUBCUTANEOUS

## 2014-04-23 MED ORDER — PREDNISONE 10 MG PO TABS
30.0000 mg | ORAL_TABLET | Freq: Every day | ORAL | Status: DC
  Administered 2014-04-24: 30 mg via ORAL
  Filled 2014-04-23 (×2): qty 1

## 2014-04-23 MED ORDER — IPRATROPIUM-ALBUTEROL 0.5-2.5 (3) MG/3ML IN SOLN: 3.0000 mL | Freq: Three times a day (TID) | RESPIRATORY_TRACT | Status: DC

## 2014-04-23 MED ORDER — IPRATROPIUM-ALBUTEROL 0.5-2.5 (3) MG/3ML IN SOLN
3.0000 mL | Freq: Three times a day (TID) | RESPIRATORY_TRACT | Status: DC
  Administered 2014-04-23 – 2014-04-24 (×3): 3 mL via RESPIRATORY_TRACT
  Filled 2014-04-23 (×3): qty 3

## 2014-04-23 MED ORDER — INSULIN ASPART 100 UNIT/ML ~~LOC~~ SOLN
0.0000 [IU] | Freq: Every day | SUBCUTANEOUS | Status: DC
  Administered 2014-04-23: 3 [IU] via SUBCUTANEOUS

## 2014-04-23 MED ORDER — OXYCODONE HCL 5 MG PO TABS
10.0000 mg | ORAL_TABLET | ORAL | Status: DC | PRN
  Administered 2014-04-23 – 2014-04-24 (×3): 10 mg via ORAL
  Filled 2014-04-23 (×3): qty 2

## 2014-04-23 MED ORDER — AMOXICILLIN-POT CLAVULANATE 875-125 MG PO TABS
1.0000 | ORAL_TABLET | Freq: Two times a day (BID) | ORAL | Status: DC
  Administered 2014-04-23 – 2014-04-24 (×3): 1 via ORAL
  Filled 2014-04-23 (×3): qty 1

## 2014-04-23 NOTE — Progress Notes (Addendum)
[facility] PROGRESS NOTE  Terry Munoz HEN:[id number] DOB: 05/24/1950 DOA: 04/18/2014 PCP: Tula Nakayama  Summary: 64 year old white male smoker with COPD, peripheral vascular disease, hypertension, continue smoking, alcohol abuse, diabetes who was initially admitted to Dr. Mila Palmer service for angiogram and workup of right leg claudication.  Noted to have severe hyponatremia with sodium of 119. Is on hydrochlorothiazide as an outpatient. Developed severe alcohol withdrawal delirium and was transferred to hospitalist service per Dr. Mila Palmer request.  Developed acute respiratory failure with severe wheezing and bronchospasm. Chest x-ray showed possible aspiration pneumonia. Started on steroids and Unasyn.  Assessment/Plan:  Principal Problem:   Alcohol withdrawal delirium: improved, but not completely back to baseline. Patient wishes to quit. Have consulted SW.  Active Problems:  Acute hypoxic respiratory failure secondary to copd exacerbation and aspiration pneumonia: much improved. Change to pred 30 qd and will need continued taper. Continue duonebs, change to tid. BNP yesterday < 100  Aspiration pneumonia:  On unasyn. Repeat CXR confirms, and patient has continued cough. Likely occurred during episode of severe DTs. Will change to augmentin    COPD exacerbation:  See above.     Claudication of right lower extremity:  Discussed with Dr. Einar Gip. No urgent need for angio. He will likely do electively after discharge. He will discuss with patient and wife.  Will get physical therapy evaluation.    Peripheral artery disease    Diabetes mellitus:  Uncontrolled secondary to steroids. Increase Lantus for now.    Essential hypertension:  Changed back to oral medications except hydrochlorothiazide    Hyponatremia:  Suspect multifactorial: Hydrochlorothiazide, possibly beer potomania. Improving. TSH normal. Would not resume thiazide at discharge    Tobacco abuse, counseled  against  Hypokalemia: Corrected   Code Status:  full Family Communication:  Discussed with wife at bedside Disposition Plan:  Transfer to [place]. Home tomorrow if stable Consultants:  Cardiology: Einar Gip  Procedures:     Antibiotics:  unasyn 1/26 -> 1/28  Augmentin -> 1/28  HPI/Subjective: Does not recall having met me from yesterday. Thinks the year is 2011. Still coughing. Tolerating solids. Complaining of leg pain. No dyspnea. Wishes to quit drinking. "I'm done.  This is it."  Objective: Filed Vitals:   04/23/14 0954  BP: 129/75  Pulse:   Temp:   Resp:     Intake/Output Summary (Last 24 hours) at 04/23/14 1008 Last data filed at 04/22/14 1800  Gross per 24 hour  Intake    240 ml  Output   1000 ml  Net   -760 ml   Filed Weights   04/21/14 0417 04/22/14 0400 04/23/14 0400  Weight: 82.101 kg (181 lb) 81.058 kg (178 lb 11.2 oz) 79.606 kg (175 lb 8 oz)    Exam:   General:  Alert. Appropriate. Not tremulous.  Cardiovascular: Regular rate rhythm without murmurs gallops rubs  Respiratory: CTA without WRR  Abdomen: Obese, soft, normal bowel sounds.  Ext: minimal erythema on left leg. No edema.  Basic Metabolic Panel:  Recent Labs Lab 04/19/14 0350 04/20/14 0414 04/21/14 0405 04/22/14 0427 04/23/14 0543  NA 119* 123* 126* 128* 132*  K 3.8 4.0 3.6 3.3* 4.3  CL 77* 84* 91* 90* 97  CO2 29 29 24 23 29   GLUCOSE 136* 139* 125* 270* 205*  BUN 11 10 5* 11 11  CREATININE 1.14 1.03 0.95 0.95 0.97  CALCIUM 8.6 8.6 8.6 8.8 9.0   Liver Function Tests:  Recent Labs Lab 04/21/14 0405  AST 60*  ALT 45  ALKPHOS 90  BILITOT 0.7  PROT 6.4  ALBUMIN 3.1*   No results for input(s): LIPASE, AMYLASE in the last 168 hours. No results for input(s): AMMONIA in the last 168 hours. CBC:  Recent Labs Lab 04/18/14 1745 04/19/14 0350 04/20/14 0414 04/21/14 0405 04/23/14 0543  WBC 10.8* 9.3 8.8 9.4 14.6*  NEUTROABS 8.7*  --   --   --   --   HGB 13.4 12.2*  11.8* 11.5* 11.1*  HCT 34.7* 32.3* 31.8* 31.2* 30.9*  MCV 89.4 89.5 90.6 90.2 91.4  PLT 193 194 171 164 202   Cardiac Enzymes: No results for input(s): CKTOTAL, CKMB, CKMBINDEX, TROPONINI in the last 168 hours. BNP (last 3 results) No results for input(s): PROBNP in the last 8760 hours. CBG:  Recent Labs Lab 04/22/14 1623 04/22/14 1938 04/22/14 2359 04/23/14 0410 04/23/14 0744  GLUCAP 305* 306* 246* 201* 176*   ABG    Component Value Date/Time   PHART 7.414 04/21/2014 1214   PCO2ART 33.8* 04/21/2014 1214   PO2ART 51.1* 04/21/2014 1214   HCO3 21.3 04/21/2014 1214   TCO2 22.3 04/21/2014 1214   ACIDBASEDEF 2.6* 04/21/2014 1214   O2SAT 86.3 04/21/2014 1214    Recent Results (from the past 240 hour(s))  MRSA PCR Screening     Status: None   Collection Time: 04/20/14  6:32 PM  Result Value Ref Range Status   MRSA by PCR NEGATIVE NEGATIVE Final    Comment:        The GeneXpert MRSA Assay (FDA approved for NASAL specimens only), is one component of a comprehensive MRSA colonization surveillance program. It is not intended to diagnose MRSA infection nor to guide or monitor treatment for MRSA infections.      Studies: Dg Chest Port 1 View  04/22/2014   CLINICAL DATA:  COPD.  EXAM: PORTABLE CHEST - 1 VIEW  COMPARISON:  04/21/2014.  03/25/2014.  02/22/2013.  FINDINGS: Mediastinum and hilar structures normal. Slight increased lung markings in the left lower lobe consistent mild infiltrate. No pleural effusion or pneumothorax. Heart size stable. Prior cervical spine fusion.  IMPRESSION: Slight increase in lung markings in the left lower lobe consistent with mild pneumonia, progressed from prior day's exam.   Electronically Signed   By: Marcello Moores  Register   On: 04/22/2014 12:06   Dg Chest Port 1 View  04/21/2014   CLINICAL DATA:  COPD, possible aspiration, respiratory distress  EXAM: PORTABLE CHEST - 1 VIEW  COMPARISON:  04/20/2014  FINDINGS: There is hazy left lower lobe  airspace disease which may reflect atelectasis versus aspiration pneumonia. There is no pleural effusion or pneumothorax. The heart and mediastinum are normal. The osseous structures are normal.  IMPRESSION: Hazy left lower lobe airspace disease which may reflect atelectasis versus pneumonia including aspiration.   Electronically Signed   By: Kathreen Devoid   On: 04/21/2014 13:10    Scheduled Meds: . ampicillin-sulbactam (UNASYN) IV  3 g Intravenous Q6H  . antiseptic oral rinse  7 mL Mouth Rinse BID  . aspirin EC  81 mg Oral Daily  . atorvastatin  40 mg Oral q1800  . Chlorhexidine Gluconate Cloth  6 each Topical Once  . clopidogrel  75 mg Oral Daily  . folic acid  1 mg Oral Daily  . furosemide  20 mg Oral Daily  . heparin subcutaneous  5,000 Units Subcutaneous 3 times per day  . insulin aspart  0-15 Units Subcutaneous TID WC  . insulin glargine  25 Units Subcutaneous Daily  .  ipratropium-albuterol  3 mL Nebulization Q6H  . irbesartan  75 mg Oral Daily  . metoprolol tartrate  12.5 mg Oral BID  . mometasone-formoterol  2 puff Inhalation BID  . nicotine  21 mg Transdermal Daily  . potassium chloride  40 mEq Oral BID  . [START ON 04/24/2014] predniSONE  30 mg Oral Q breakfast  . sodium chloride  3 mL Intravenous Q12H  . sucralfate  1 g Oral TID WC & HS  . thiamine  100 mg Oral Daily   Continuous Infusions:   Critical care time: 35 minutes  [facility] [email or web address], password [facility] 04/23/2014, 10:08 AM  LOS: 5 days

## 2014-04-23 NOTE — Progress Notes (Signed)
Subjective:  Patient feels well and states with oral ain medications, leg pain is not bad. He states he is thankful to [place] for taking care of him and is willing to completely give up alcohol and tobacco.   Objective:  Vital Signs in the last 24 hours: Temp:  [97.6 F (36.4 C)-98.6 F (37 C)] 97.7 F (36.5 C) (01/28 1747) Pulse Rate:  [76-106] 106 (01/28 1747) Resp:  [14-19] 16 (01/28 1747) BP: (124-148)/(54-82) 131/63 mmHg (01/28 1747) SpO2:  [93 %-100 %] 96 % (01/28 1747) Weight:  [79.606 kg (175 lb 8 oz)] 79.606 kg (175 lb 8 oz) (01/28 0400)  Intake/Output from previous day: 01/27 0701 - 01/28 0700 In: 240 [P.O.:240] Out: 1000 [Urine:1000]  Physical Exam:  GeneralMental Status- A, Ox3   Head and Neck: No JVD ThyroidGland Characteristics- no palpable nodules. no palpable enlargement.   Chest and Lung Exam: Palpation: Prolonged expiration- Both Lung Fields. Adventitious sounds:Expiratory wheeze- Both Lung Fields.  scattered rhonchi present much improved from 2 days ago.   Cardiovascular Inspection:Jugular vein- Right- No Distention. Auscultation:Heart Sounds- S1 WNL, S2 WNL and No gallop present. Note: Distant Murmurs & Other Heart Sounds: Murmur:- No murmur.   Abdomen: Palpation/Percussion:Tenderness- Right Upper Quadrant. Liver:Lower border- 4 cm below costal margin. Auscultation:Auscultation of the abdomen reveals - Bowel sounds normal.   Peripheral Vascular: Both the legs are warm. Sensation preserved. Lower Extremity:Inspectionboth legs look pink, No Varicose veins. Palpation:Both the legs are warm, Edema: Left- No edema. Right- 1 Plus pitting below knee edema. Femoral pulse:- Left- Normal. Right- Normal. Popliteal pulse:- Left- Normal. Right- Absent. Dorsalis pedis pulse- Bilateral- Absent. Posterior tibial pulse- Left- 2+. Right- Absent. Carotid arteries:- Left- No Carotid bruit. Right- No Carotid bruit. Abdomen- No prominent  abdominal aortic pulsation or epigastric bruit.  Lab Results: BMP  Recent Labs  04/21/14 0405 04/22/14 0427 04/23/14 0543  NA 126* 128* 132*  K 3.6 3.3* 4.3  CL 91* 90* 97  CO2 24 23 29   GLUCOSE 125* 270* 205*  BUN 5* 11 11  CREATININE 0.95 0.95 0.97  CALCIUM 8.6 8.8 9.0  GFRNONAA 85* 85* 85*  GFRAA >90 >90 >90    CBC  Recent Labs Lab 04/18/14 1745  04/23/14 0543  WBC 10.8*  < > 14.6*  RBC 3.88*  < > 3.38*  HGB 13.4  < > 11.1*  HCT 34.7*  < > 30.9*  PLT 193  < > 202  MCV 89.4  < > 91.4  MCH 34.5*  < > 32.8  MCHC 38.6*  < > 35.9  RDW 12.4  < > 12.3  LYMPHSABS 1.1  --   --   MONOABS 0.9  --   --   EOSABS 0.1  --   --   BASOSABS 0.0  --   --   < > = values in this interval not displayed.   CHOLESTEROL  Recent Labs  04/20/14 0414  CHOL 115    Hepatic Function Panel  Recent Labs  04/21/14 0405  PROT 6.4  ALBUMIN 3.1*  AST 60*  ALT 45  ALKPHOS 90  BILITOT 0.7    Imaging: LE venous duplex Negative for DVT right leg   Cardiac Studies:  EKG: 04/18/2014: NSR @ 83/min. Normal axis, poor R progression, CRO anterior infarct, OLD. No ischemia.  Outpatient procedures:  Echocardiogram 04/14/2014: Left ventricle cavity is normal in size. Normal global wall motion. Normal diastolic filling pattern. Visual EF is 55-60%. Calculated EF 51%. Trace mitral regurgitation. Trace tricuspid regurgitation.  Trace pulmonic regurgitation. Essentially normal echocardiogram.  Lexiscan myoview stress test 04/13/2014: 1. The resting electrocardiogram demonstrated normal sinus rhythm, normal resting conduction, poor R progression, cannot r/o anterior infarct, old. No resting arrhythmias and normal rest repolarization. Stress EKG is nondiagnostic for ischemia as it is a Pharmacologic stress test using Lexiscan infusion. Stress symptoms included dyspnea, dizziness. 2. Myocardial perfusion imaging is normal. Overall left ventricular systolic function was normal without regional  wall motion abnormalities. The left ventricular ejection fraction was 74%.  Lower extremity arterial duplex 04/09/2014: Right distal SFA occlusion with trickle flow below the knee. This exam reveals critically decreased perfusion of the right lower extremity with ABI 0.08, noted at the post tibial artery level. This exam reveals normal perfusion of the left lower extremity with ABI 1.00. Consider further work up including angiography/CT angiogram to further evaluate.   Assessment/Plan:  1. Acute delirium tremens, resolved. 2. Severe peripheral arterial disease involving the right leg, chronic non-limb threatening critical ischemia. 3. Hypertension 4. Diabetes mellitus type 2 controlled previously, now blood sugar elevated due to steroid use. Stress also leading to hyperglycemia. 5. Tobacco use disorder 6. Hyponatremia  Improving. Now off diuretics and suspect potomania   Labs 02/02/2014: BUN 9, serum creatinine 1.0. Serum sodium was 140.  Total cholesterol 236, triglycerides 43, HDL 88, LDL 139. TSH normal. CBC normal.  Recommendation:  I have met the family including his wife and also with the patient and discussed in detail that we should postpone peripheral arteriogram for now until his pneumonia is completely resolved and he is medically stable. We will continue with Percocet for pain control in the outpatient basis. I will see her back in the office in a week and try to schedule peripheral arteriogram the following week or so. With ongoing treatment of pneumonia and recent withdrawal from alcohol, I'll prefer to wait. Patient's family very appreciative of all the care that they are received. Patient appears very motivated for smoking cessation and also complete avoidance of alcohol.  Laverda Page, M.D. 04/23/2014, 6:20 PM [facility], [place] Pager: [phone] Office: [phone] If no answer: [phone]

## 2014-04-23 NOTE — Progress Notes (Signed)
CSW (Clinical Education officer, museum) visited pt room to discuss substance abuse resources. Pt family and friends at bedside. CSW asked if CSW could speak with pt alone. Pt and pt spouse asked that CSW return at a later time. CSW will attempt to speak with pt and provide resource before discharge.  [place], [place]

## 2014-04-23 NOTE — Progress Notes (Signed)
Inpatient Diabetes Program Recommendations  AACE/ADA: New Consensus Statement on Inpatient Glycemic Control (2013)  Target Ranges:  Prepandial:   less than 140 mg/dL      Peak postprandial:   less than 180 mg/dL (1-2 hours)      Critically ill patients:  140 - 180 mg/dL   Results for DIMITRIOS, BALESTRIERI (MRN [id number]) as of 04/23/2014 09:34  Ref. Range 04/22/2014 19:38 04/22/2014 23:59 04/23/2014 04:10 04/23/2014 05:43 04/23/2014 07:44  Glucose-Capillary Latest Range: 70-99 mg/dL 306 (H) 246 (H) 201 (H)  176 (H)    Reason for assessment: elevated blood sugars  Diabetes history: Type 2 Outpatient Diabetes medications: Lantus 25 units daily Current orders for Inpatient glycemic control: Lantus 25 units daily, Novolog 0-15 units tid with meals  Much improved since yesterday. No hypoglycemia. Will continue to monitor.   Gentry Fitz, RN, BA, MHA, CDE Diabetes Coordinator Inpatient Diabetes Program  [phone] (Team Pager) [phone] [facility] Office) 04/23/2014 9:38 AM

## 2014-04-24 LAB — GLUCOSE, CAPILLARY
GLUCOSE-CAPILLARY: 74 mg/dL (ref 70–99)
Glucose-Capillary: 267 mg/dL — ABNORMAL HIGH (ref 70–99)

## 2014-04-24 MED ORDER — FOLIC ACID 1 MG PO TABS: 1.0000 mg | ORAL_TABLET | Freq: Every day | ORAL | Status: DC

## 2014-04-24 MED ORDER — AMOXICILLIN-POT CLAVULANATE 875-125 MG PO TABS: 1.0000 | ORAL_TABLET | Freq: Two times a day (BID) | ORAL | Status: DC

## 2014-04-24 MED ORDER — OXYCODONE HCL 10 MG PO TABS: 10.0000 mg | ORAL_TABLET | ORAL | Status: DC | PRN

## 2014-04-24 MED ORDER — IRBESARTAN 75 MG PO TABS: 75.0000 mg | ORAL_TABLET | Freq: Every day | ORAL | Status: DC

## 2014-04-24 MED ORDER — INSULIN GLARGINE 100 UNIT/ML ~~LOC~~ SOLN: 30.0000 [IU] | Freq: Every day | SUBCUTANEOUS | Status: DC

## 2014-04-24 MED ORDER — METOPROLOL TARTRATE 25 MG PO TABS: 12.5000 mg | ORAL_TABLET | Freq: Two times a day (BID) | ORAL | Status: DC

## 2014-04-24 MED ORDER — NICOTINE 14 MG/24HR TD PT24: 14.0000 mg | MEDICATED_PATCH | Freq: Every day | TRANSDERMAL | Status: DC

## 2014-04-24 MED ORDER — THIAMINE HCL 100 MG PO TABS: 100.0000 mg | ORAL_TABLET | Freq: Every day | ORAL | Status: DC

## 2014-04-24 MED ORDER — PREDNISONE 10 MG PO TABS: ORAL_TABLET | ORAL | Status: DC

## 2014-04-24 NOTE — Discharge Summary (Signed)
Discharge instructions reviewed with patient and wife. Questions answered. Patient aware of follow-up appointments, home health PT/OT, and information r/t obtaining ramp at home. AVS and prescriptions given to patient. IV discontinued. Nursing student to take patient via wheelchair to [place] entrance where wife will be waiting to pick him up.

## 2014-04-24 NOTE — Discharge Summary (Signed)
Physician Discharge Summary  Terry Munoz PZW:[id number] DOB: 1950-03-30 DOA: 04/18/2014  PCP: Tula Nakayama  Admit date: 04/18/2014 Discharge date: 04/24/2014  Time spent: 35 minutes  Recommendations for Outpatient Follow-up:  1. Home health 2. Can wean lantus back down once off steroids  Discharge Diagnoses:  Principal Problem:   Alcohol withdrawal delirium Active Problems:   Claudication of right lower extremity   Peripheral artery disease   Diabetes mellitus, stable   Essential hypertension   Hyponatremia   Tobacco abuse   COPD exacerbation   possible Aspiration pneumonia   DM type 2 (diabetes mellitus, type 2)   Discharge Condition: improved  Diet recommendation: cardiac/diabetic  Filed Weights   04/22/14 0400 04/23/14 0400 04/24/14 0504  Weight: 81.058 kg (178 lb 11.2 oz) 79.606 kg (175 lb 8 oz) 71.85 kg (158 lb 6.4 oz)    History of present illness:  Terry Munoz is a 64 year old Caucasian male with a history of COPD with a ~100 pack year history of smoking with continued tobacco use, hyperlipidemia, hypertension, and diabetes who presents for evaluation of right leg pain. He is accompanied by his wife at the bedside. I had seen him about 2 weeks ago in the outpatient basis when he was initially referred for dyspnea evaluation. I had suspected chronic severe PAD non-limb threatening. Due to worsening pain and swelling in his legs, he presented to the ED and admitted for further evaluation of rest pain that has been chronic (> 2 weeks). No ulceration, but foot has remained chronically purpulish and painful. He was scheduled for PV angiogram tomorrow.  He states he has had shortness of breath for over a year which has been attributed to COPD. I He denies PND, orthopnea, or edema, although he states he has begun sleeping with an extra pillow within the past few weeks. He states his symptoms have improved significantly since being on furosemide. He reports an  occasional sharp chest pain, typically at rest, but denies any pain with exertion. No chest pain, palpitations, recent weight change.  Hospital Course:  Alcohol withdrawal delirium: improved, but not completely back to baseline. Patient going  to quit.  Acute hypoxic respiratory failure secondary to copd exacerbation and aspiration pneumonia: much improved. Taper steroids  Aspiration pneumonia: augmentin   COPD exacerbation: See above.    Claudication of right lower extremity:Dr. Conley Canal Discussed with Dr. Einar Gip. No urgent need for angio. He will likely do electively after discharge   Peripheral artery disease   Diabetes mellitus: Uncontrolled secondary to steroids. Increase Lantus for now.   Essential hypertension: Changed back to oral medications except hydrochlorothiazide   Hyponatremia: Suspect multifactorial: Hydrochlorothiazide, possibly beer potomania. Improving. TSH normal. D/ thiazide   Tobacco abuse, counseled against  Hypokalemia: Corrected    Consultations:  cardiology  Discharge Exam: Filed Vitals:   04/24/14 0851  BP: 137/73  Pulse: 82  Temp:   Resp:     General: A+Ox3, NAD Cardiovascular: rrr Respiratory: clear  Discharge Instructions   Discharge Instructions    Diet - low sodium heart healthy    Complete by:  As directed      Diet Carb Modified    Complete by:  As directed      Discharge instructions    Complete by:  As directed   Outpatient arteriogram Cbc, bmp 1 week Stop smoking and using alcohol     Increase activity slowly    Complete by:  As directed  Current Discharge Medication List    START taking these medications   Details  amoxicillin-clavulanate (AUGMENTIN) 875-125 MG per tablet Take 1 tablet by mouth every 12 (twelve) hours. Qty: 8 tablet, Refills: 0    folic acid (FOLVITE) 1 MG tablet Take 1 tablet (1 mg total) by mouth daily.    irbesartan (AVAPRO) 75 MG tablet Take 1 tablet (75 mg total)  by mouth daily. Qty: 30 tablet, Refills: 0    nicotine (NICODERM CQ - DOSED IN MG/24 HOURS) 14 mg/24hr patch Place 1 patch (14 mg total) onto the skin daily. Qty: 14 patch, Refills: 0    oxyCODONE 10 MG TABS Take 1 tablet (10 mg total) by mouth every 4 (four) hours as needed for severe pain. Qty: 30 tablet, Refills: 0    predniSONE (DELTASONE) 10 MG tablet 20 mg daily x 2 days, 10 mg daily x 2 days then d/c Qty: 6 tablet, Refills: 0    thiamine 100 MG tablet Take 1 tablet (100 mg total) by mouth daily.      CONTINUE these medications which have CHANGED   Details  insulin glargine (LANTUS) 100 UNIT/ML injection Inject 0.3 mLs (30 Units total) into the skin daily. Qty: 10 mL, Refills: 11    metoprolol tartrate (LOPRESSOR) 25 MG tablet Take 0.5 tablets (12.5 mg total) by mouth 2 (two) times daily. Qty: 60 tablet, Refills: 0      CONTINUE these medications which have NOT CHANGED   Details  albuterol (PROVENTIL) (2.5 MG/3ML) 0.083% nebulizer solution Take 2.5 mg by nebulization every 6 (six) hours as needed for wheezing or shortness of breath.    amitriptyline (ELAVIL) 50 MG tablet Take 50 mg by mouth at bedtime.    aspirin 81 MG tablet Take 81 mg by mouth daily.    atorvastatin (LIPITOR) 40 MG tablet Take 40 mg by mouth daily.    clopidogrel (PLAVIX) 75 MG tablet Take 75 mg by mouth daily.    Fluticasone-Salmeterol (ADVAIR) 250-50 MCG/DOSE AEPB Inhale 1 puff into the lungs 2 (two) times daily.    furosemide (LASIX) 40 MG tablet Take 20 mg by mouth daily.    potassium chloride (MICRO-K) 10 MEQ CR capsule Take 10 mEq by mouth daily.    sucralfate (CARAFATE) 1 G tablet Take 1 tablet (1 g total) by mouth 4 (four) times daily -  with meals and at bedtime. Qty: 30 tablet, Refills: 0      STOP taking these medications     celecoxib (CELEBREX) 200 MG capsule      HYDROcodone-acetaminophen (NORCO/VICODIN) 5-325 MG per tablet      olmesartan-hydrochlorothiazide (BENICAR HCT)  40-12.5 MG per tablet      oxyCODONE-acetaminophen (PERCOCET/ROXICET) 5-325 MG per tablet      zolpidem (AMBIEN) 10 MG tablet        Allergies  Allergen Reactions  . Sulfa Antibiotics Hives   Follow-up Information    Follow up with Rachel Bo, NP On 04/29/2014.   Specialty:  Nurse Practitioner   Why:  10:30 Appointment. Bring all medications   Contact information:   [place] [address] [phone]       Follow up with [facility].   Why:  Home Health Services: Physical and Occupational Therapy   Contact information:   [place] [address] [phone]       Follow up with [facility].   Why:  Wheel chair and  Rolling walker    Contact information:   [address] [place] [address] [phone]        The results of significant diagnostics from this hospitalization (including imaging, microbiology, ancillary and laboratory) are listed below for reference.    Significant Diagnostic Studies: Dg Chest Port 1 View  04/22/2014   CLINICAL DATA:  COPD.  EXAM: PORTABLE CHEST - 1 VIEW  COMPARISON:  04/21/2014.  03/25/2014.  02/22/2013.  FINDINGS: Mediastinum and hilar structures normal. Slight increased lung markings in the left lower lobe consistent mild infiltrate. No pleural effusion or pneumothorax. Heart size stable. Prior cervical spine fusion.  IMPRESSION: Slight increase in lung markings in the left lower lobe consistent with mild pneumonia, progressed from prior day's exam.   Electronically Signed   By: Marcello Moores  Register   On: 04/22/2014 12:06   Dg Chest Port 1 View  04/21/2014   CLINICAL DATA:  COPD, possible aspiration, respiratory distress  EXAM: PORTABLE CHEST - 1 VIEW  COMPARISON:  04/20/2014  FINDINGS: There is hazy left lower lobe airspace disease which may reflect atelectasis versus aspiration pneumonia. There is no pleural effusion or pneumothorax. The heart  and mediastinum are normal. The osseous structures are normal.  IMPRESSION: Hazy left lower lobe airspace disease which may reflect atelectasis versus pneumonia including aspiration.   Electronically Signed   By: Kathreen Devoid   On: 04/21/2014 13:10   Dg Chest Port 1 View  04/20/2014   CLINICAL DATA:  64 year old with hyponatremia and confusion.  EXAM: PORTABLE CHEST - 1 VIEW  COMPARISON:  03/25/2014  FINDINGS: Decreased lung markings in the upper lungs probably represent hyperinflation and emphysematous changes. There is vascular crowding in the lower lungs which may represent atelectasis. Heart size is normal. The trachea is midline. Surgical plates in the cervical spine.  IMPRESSION: Increased densities in the lower lungs probably represent atelectasis.   Electronically Signed   By: Markus Daft M.D.   On: 04/20/2014 10:52    Microbiology: Recent Results (from the past 240 hour(s))  MRSA PCR Screening     Status: None   Collection Time: 04/20/14  6:32 PM  Result Value Ref Range Status   MRSA by PCR NEGATIVE NEGATIVE Final    Comment:        The GeneXpert MRSA Assay (FDA approved for NASAL specimens only), is one component of a comprehensive MRSA colonization surveillance program. It is not intended to diagnose MRSA infection nor to guide or monitor treatment for MRSA infections.      Labs: Basic Metabolic Panel:  Recent Labs Lab 04/19/14 0350 04/20/14 0414 04/21/14 0405 04/22/14 0427 04/23/14 0543  NA 119* 123* 126* 128* 132*  K 3.8 4.0 3.6 3.3* 4.3  CL 77* 84* 91* 90* 97  CO2 29 29 24 23 29   GLUCOSE 136* 139* 125* 270* 205*  BUN 11 10 5* 11 11  CREATININE 1.14 1.03 0.95 0.95 0.97  CALCIUM 8.6 8.6 8.6 8.8 9.0   Liver Function Tests:  Recent Labs Lab 04/21/14 0405  AST 60*  ALT 45  ALKPHOS 90  BILITOT 0.7  PROT 6.4  ALBUMIN 3.1*   No results for input(s): LIPASE, AMYLASE in the last 168 hours. No results for input(s): AMMONIA in the last 168  hours. CBC:  Recent Labs Lab 04/18/14 1745 04/19/14 0350 04/20/14 0414 04/21/14 0405 04/23/14 0543  WBC 10.8* 9.3 8.8 9.4 14.6*  NEUTROABS 8.7*  --   --   --   --  HGB 13.4 12.2* 11.8* 11.5* 11.1*  HCT 34.7* 32.3* 31.8* 31.2* 30.9*  MCV 89.4 89.5 90.6 90.2 91.4  PLT 193 194 171 164 202   Cardiac Enzymes: No results for input(s): CKTOTAL, CKMB, CKMBINDEX, TROPONINI in the last 168 hours. BNP: BNP (last 3 results) No results for input(s): PROBNP in the last 8760 hours. CBG:  Recent Labs Lab 04/23/14 0744 04/23/14 1139 04/23/14 1628 04/23/14 2059 04/24/14 0830  GLUCAP 176* 243* 159* 285* 267*       Signed:  Sayde Lish  [facility] 04/24/2014, 11:41 AM

## 2014-04-24 NOTE — Evaluation (Signed)
Physical Therapy Evaluation Patient Details Name: Terry Munoz MRN: [id number] DOB: 1951/01/14 Today's Date: 04/24/2014   History of Present Illness  Pt admit for painful right LE.  Went through alcohol withdrawal delirium.  Also with PNA and HTN.  Plan is to have right LE surgery due to PAD in a few weeks.    Clinical Impression  Pt admitted with above diagnosis. Pt currently with functional limitations due to the deficits listed below (see PT Problem List). Pt has 24 hour care by wife and equipment needs below.  Pt understands to only ambulate with RW with wife.  Otherwise, use wheelchair at home.  HHPT and HHOT f/u for home safety recommendations.   Pt will benefit from skilled PT to increase their independence and safety with mobility to allow discharge to the venue listed below.      Follow Up Recommendations Home health PT;Other (comment);Supervision/Assistance - 24 hour (HHOT)    Equipment Recommendations  Rolling walker with 5" wheels;Wheelchair cushion (measurements PT);Wheelchair (measurements PT);Other (comment) (16x18 lightweight wheelchair with anti tippers/desk arms;RAMP) Wife went to gift shop and bought gait belt.   Recommendations for Other Services       Precautions / Restrictions Precautions Precautions: Fall Restrictions Weight Bearing Restrictions: No      Mobility  Bed Mobility Overal bed mobility: Independent             General bed mobility comments: Takes incr time  Transfers Overall transfer level: Needs assistance Equipment used: Rolling walker (2 wheeled) Transfers: Sit to/from Stand Sit to Stand: Min assist         General transfer comment: Pt needed cues for hand placement and assist for power up.  Pt cannot put weight on his right LE due to pain.   Ambulation/Gait Ambulation/Gait assistance: Min assist Ambulation Distance (Feet): 20 Feet Assistive device: Rolling walker (2 wheeled) Gait Pattern/deviations: Step-to  pattern;Decreased weight shift to right;Decreased stride length;Antalgic   Gait velocity interpretation: Below normal speed for age/gender General Gait Details: Pt unable to place right LE on floor due to pain.  Pt uses RW with fairly good safety awareness.  Wife educated in how to guard pt and went to gift shop to buy gait belt for home use.  Pt fatigues quickly but was showing safety with RW with cues.    Stairs            Wheelchair Mobility    Modified Rankin (Stroke Patients Only)       Balance Overall balance assessment: Needs assistance         Standing balance support: Bilateral upper extremity supported;During functional activity Standing balance-Leahy Scale: Poor Standing balance comment: REquires UE support for steadying assist.                              Pertinent Vitals/Pain Pain Assessment: 0-10 Pain Score: 9  Pain Location: right LE Pain Descriptors / Indicators: Aching;Stabbing Pain Intervention(s): Limited activity within patient's tolerance;Monitored during session;Repositioned  VSS    Home Living Family/patient expects to be discharged to:: Private residence Living Arrangements: Spouse/significant other Available Help at Discharge: Family;Available 24 hours/day Type of Home: House Home Access: Stairs to enter Entrance Stairs-Rails: Right;Left;Can reach both Entrance Stairs-Number of Steps: 6 Home Layout: One level Home Equipment: Crutches;Bedside commode      Prior Function Level of Independence: Needs assistance   Gait / Transfers Assistance Needed: Has been using crutches lately due to right LE  pain with wife assisting pt.  ADL's / Homemaking Assistance Needed: bathed and dressed with help from wife for 1 month  Comments: Wife states pt was functioning independently a month ago and has gotten progressively worse.       Hand Dominance        Extremity/Trunk Assessment   Upper Extremity Assessment: Defer to OT  evaluation           Lower Extremity Assessment: RLE deficits/detail      Cervical / Trunk Assessment: Normal  Communication   Communication: No difficulties  Cognition Arousal/Alertness: Awake/alert Behavior During Therapy: WFL for tasks assessed/performed Overall Cognitive Status: Within Functional Limits for tasks assessed                      General Comments General comments (skin integrity, edema, etc.): Pt educated on safety awareness.  Two friends will help wife get pt in house as he has 6 steps.  CM to give pt and wife resources to build ramp.      Exercises        Assessment/Plan    PT Assessment Patient needs continued PT services  PT Diagnosis Generalized weakness;Acute pain   PT Problem List Decreased activity tolerance;Decreased balance;Decreased mobility;Decreased knowledge of use of DME;Decreased safety awareness;Decreased knowledge of precautions;Pain  PT Treatment Interventions DME instruction;Gait training;Functional mobility training;Therapeutic activities;Therapeutic exercise;Balance training;Patient/family education   PT Goals (Current goals can be found in the Care Plan section) Acute Rehab PT Goals Patient Stated Goal: to go home PT Goal Formulation: With patient Time For Goal Achievement: 05/02/15 Potential to Achieve Goals: Good    Frequency Min 3X/week   Barriers to discharge        Co-evaluation               End of Session Equipment Utilized During Treatment: Gait belt Activity Tolerance: Patient limited by fatigue;Patient limited by pain Patient left: with call bell/phone within reach;in bed;with family/visitor present Nurse Communication: Mobility status         Time: 5170-0174 PT Time Calculation (min) (ACUTE ONLY): 24 min   Charges:   PT Evaluation $Initial PT Evaluation Tier I: 1 Procedure PT Treatments $Gait Training: 8-22 mins   PT G CodesDenice Paradise 13-May-2014, 1:01 PM [place]  Shalin Linders,PT Acute Rehabilitation [phone] (pager)

## 2014-04-24 NOTE — Progress Notes (Signed)
Inpatient Diabetes Program Recommendations  AACE/ADA: New Consensus Statement on Inpatient Glycemic Control (2013)  Target Ranges:  Prepandial:   less than 140 mg/dL      Peak postprandial:   less than 180 mg/dL (1-2 hours)      Critically ill patients:  140 - 180 mg/dL   Results for Terry Munoz, Terry Munoz (MRN [id number]) as of 04/24/2014 09:25  Ref. Range 04/23/2014 07:44 04/23/2014 11:39 04/23/2014 16:28 04/23/2014 20:59 04/24/2014 08:30  Glucose-Capillary Latest Range: 70-99 mg/dL 176 (H) 243 (H) 159 (H) 285 (H) 267 (H)    Reason for assessment: elevated CBG  Diabetes history: Type 2 Outpatient Diabetes medications: Lantus 25 units daily Current orders for Inpatient glycemic control: Lantus 30 units daily, Novolog correction 0-5 units qhs, Novolog correction 0-20 units tid with meals- increased yesterday.   No recommendations at this time. Agree with current orders.   Gentry Fitz, RN, BA, MHA, CDE Diabetes Coordinator Inpatient Diabetes Program  [phone] (Team Pager) [phone] [facility]) 04/24/2014 9:30 AM

## 2014-04-29 DIAGNOSIS — R0602 Shortness of breath: Secondary | ICD-10-CM | POA: Diagnosis not present

## 2014-04-29 DIAGNOSIS — I739 Peripheral vascular disease, unspecified: Secondary | ICD-10-CM | POA: Diagnosis not present

## 2014-04-29 DIAGNOSIS — R0789 Other chest pain: Secondary | ICD-10-CM | POA: Diagnosis not present

## 2014-04-29 DIAGNOSIS — Z87891 Personal history of nicotine dependence: Secondary | ICD-10-CM | POA: Diagnosis not present

## 2014-04-29 DIAGNOSIS — M79604 Pain in right leg: Secondary | ICD-10-CM | POA: Diagnosis not present

## 2014-05-01 ENCOUNTER — Ambulatory Visit (HOSPITAL_COMMUNITY)
Admission: RE | Admit: 2014-05-01 | Discharge: 2014-05-01 | Disposition: A | Discharge status: Home / Self Care | Payer: Medicare Other | Source: Ambulatory Visit | Attending: Cardiology | Admitting: Cardiology

## 2014-05-01 ENCOUNTER — Encounter (HOSPITAL_COMMUNITY)
Admission: RE | Disposition: A | Discharge status: Home / Self Care | Payer: Self-pay | Source: Ambulatory Visit | Attending: Cardiology

## 2014-05-01 ENCOUNTER — Encounter (HOSPITAL_COMMUNITY): Payer: Self-pay | Admitting: General Practice

## 2014-05-01 DIAGNOSIS — E780 Pure hypercholesterolemia: Secondary | ICD-10-CM | POA: Diagnosis not present

## 2014-05-01 DIAGNOSIS — I739 Peripheral vascular disease, unspecified: Secondary | ICD-10-CM | POA: Diagnosis not present

## 2014-05-01 DIAGNOSIS — E871 Hypo-osmolality and hyponatremia: Secondary | ICD-10-CM | POA: Insufficient documentation

## 2014-05-01 DIAGNOSIS — I70201 Unspecified atherosclerosis of native arteries of extremities, right leg: Secondary | ICD-10-CM | POA: Insufficient documentation

## 2014-05-01 DIAGNOSIS — Z87891 Personal history of nicotine dependence: Secondary | ICD-10-CM | POA: Insufficient documentation

## 2014-05-01 DIAGNOSIS — I10 Essential (primary) hypertension: Secondary | ICD-10-CM | POA: Insufficient documentation

## 2014-05-01 DIAGNOSIS — E119 Type 2 diabetes mellitus without complications: Secondary | ICD-10-CM | POA: Diagnosis not present

## 2014-05-01 DIAGNOSIS — I7092 Chronic total occlusion of artery of the extremities: Secondary | ICD-10-CM | POA: Diagnosis not present

## 2014-05-01 DIAGNOSIS — E1165 Type 2 diabetes mellitus with hyperglycemia: Secondary | ICD-10-CM | POA: Insufficient documentation

## 2014-05-01 HISTORY — DX: Peripheral vascular disease, unspecified: I73.9

## 2014-05-01 HISTORY — PX: LOWER EXTREMITY ANGIOGRAM: SHX5508

## 2014-05-01 LAB — GLUCOSE, CAPILLARY
GLUCOSE-CAPILLARY: 169 mg/dL — AB (ref 70–99)
GLUCOSE-CAPILLARY: 201 mg/dL — AB (ref 70–99)
GLUCOSE-CAPILLARY: 223 mg/dL — AB (ref 70–99)
Glucose-Capillary: 228 mg/dL — ABNORMAL HIGH (ref 70–99)

## 2014-05-01 LAB — POCT ACTIVATED CLOTTING TIME
ACTIVATED CLOTTING TIME: 269 s
Activated Clotting Time: 546 seconds

## 2014-05-01 SURGERY — ANGIOGRAM, LOWER EXTREMITY

## 2014-05-01 MED ORDER — HYDROMORPHONE HCL 1 MG/ML IJ SOLN
0.5000 mg | Freq: Once | INTRAMUSCULAR | Status: AC
Start: 2014-05-01 — End: 2014-05-01
  Administered 2014-05-01: 0.5 mg via INTRAVENOUS

## 2014-05-01 MED ORDER — HYDROMORPHONE HCL 1 MG/ML IJ SOLN
INTRAMUSCULAR | Status: AC
  Filled 2014-05-01: qty 1

## 2014-05-01 MED ORDER — HYDROMORPHONE HCL 1 MG/ML IJ SOLN: 0.5000 mg | INTRAMUSCULAR | Status: DC | PRN

## 2014-05-01 MED ORDER — LIDOCAINE HCL (PF) 1 % IJ SOLN
INTRAMUSCULAR | Status: AC
  Filled 2014-05-01: qty 30

## 2014-05-01 MED ORDER — METOPROLOL TARTRATE 25 MG PO TABS: 25.0000 mg | ORAL_TABLET | Freq: Two times a day (BID) | ORAL | Status: DC

## 2014-05-01 MED ORDER — INSULIN ASPART 100 UNIT/ML ~~LOC~~ SOLN
0.0000 [IU] | Freq: Three times a day (TID) | SUBCUTANEOUS | Status: DC
  Administered 2014-05-01: 18:00:00 5 [IU] via SUBCUTANEOUS
  Administered 2014-05-01: 13:00:00 3 [IU] via SUBCUTANEOUS

## 2014-05-01 MED ORDER — LIDOCAINE-EPINEPHRINE 1 %-1:100000 IJ SOLN
INTRAMUSCULAR | Status: AC
Start: 2014-05-01 — End: 2014-05-01
  Filled 2014-05-01: qty 1

## 2014-05-01 MED ORDER — ALPRAZOLAM 0.5 MG PO TABS
0.5000 mg | ORAL_TABLET | Freq: Once | ORAL | Status: AC
  Administered 2014-05-01: 0.5 mg via ORAL
  Filled 2014-05-01: qty 1

## 2014-05-01 MED ORDER — ASPIRIN 81 MG PO CHEW
CHEWABLE_TABLET | ORAL | Status: AC
  Filled 2014-05-01: qty 1

## 2014-05-01 MED ORDER — BIVALIRUDIN 250 MG IV SOLR
INTRAVENOUS | Status: AC
Start: 2014-05-01 — End: 2014-05-01
  Filled 2014-05-01: qty 250

## 2014-05-01 MED ORDER — SODIUM CHLORIDE 0.9 % IV BOLUS (SEPSIS)
500.0000 mL | Freq: Once | INTRAVENOUS | Status: AC
  Administered 2014-05-01: 500 mL via INTRAVENOUS

## 2014-05-01 MED ORDER — HEPARIN SODIUM (PORCINE) 1000 UNIT/ML IJ SOLN
INTRAMUSCULAR | Status: AC
  Filled 2014-05-01: qty 1

## 2014-05-01 MED ORDER — HYDROMORPHONE HCL 1 MG/ML IJ SOLN
INTRAMUSCULAR | Status: AC
Start: 2014-05-01 — End: 2014-05-01
  Filled 2014-05-01: qty 1

## 2014-05-01 MED ORDER — SODIUM CHLORIDE 0.9 % IV SOLN: INTRAVENOUS | Status: DC

## 2014-05-01 MED ORDER — MIDAZOLAM HCL 2 MG/2ML IJ SOLN
INTRAMUSCULAR | Status: AC
  Filled 2014-05-01: qty 2

## 2014-05-01 MED ORDER — OXYCODONE-ACETAMINOPHEN 5-325 MG PO TABS
1.0000 | ORAL_TABLET | ORAL | Status: DC | PRN
  Administered 2014-05-01: 15:00:00 2 via ORAL
  Filled 2014-05-01: qty 2

## 2014-05-01 MED ORDER — NITROGLYCERIN 1 MG/10 ML FOR IR/CATH LAB
INTRA_ARTERIAL | Status: AC
  Filled 2014-05-01: qty 10

## 2014-05-01 MED ORDER — HEPARIN (PORCINE) IN NACL 2-0.9 UNIT/ML-% IJ SOLN
INTRAMUSCULAR | Status: AC
  Filled 2014-05-01: qty 1000

## 2014-05-01 MED ORDER — CLOPIDOGREL BISULFATE 75 MG PO TABS
ORAL_TABLET | ORAL | Status: AC
  Filled 2014-05-01: qty 1

## 2014-05-01 MED ORDER — SODIUM CHLORIDE 0.9 % IV SOLN: 1.0000 mL/kg/h | INTRAVENOUS | Status: DC

## 2014-05-01 MED ORDER — METOPROLOL TARTRATE 12.5 MG HALF TABLET
12.5000 mg | ORAL_TABLET | Freq: Two times a day (BID) | ORAL | Status: DC
  Administered 2014-05-01: 15:00:00 12.5 mg via ORAL
  Filled 2014-05-01 (×2): qty 1

## 2014-05-01 NOTE — Progress Notes (Signed)
Patient post PV procedure right groin level 1 soft bruising after bedrest. Patient OOB to chair limited ambulation is his baseline prior to admission and will continue to be per Dr.Ganji. No signs of bleeding to groin with movement. Family at bedside and will assisting at home with him as well. Post care instructions given and instructions if immediate follow up care is needed. Patient eager and ready to go home he was assisted to car by wheelchair.

## 2014-05-01 NOTE — H&P (View-Only) (Signed)
Subjective:  Patient feels well and states with oral ain medications, leg pain is not bad. He states he is thankful to [place] for taking care of him and is willing to completely give up alcohol and tobacco.   Objective:  Vital Signs in the last 24 hours: Temp:  [97.6 F (36.4 C)-98.6 F (37 C)] 97.7 F (36.5 C) (01/28 1747) Pulse Rate:  [76-106] 106 (01/28 1747) Resp:  [14-19] 16 (01/28 1747) BP: (124-148)/(54-82) 131/63 mmHg (01/28 1747) SpO2:  [93 %-100 %] 96 % (01/28 1747) Weight:  [79.606 kg (175 lb 8 oz)] 79.606 kg (175 lb 8 oz) (01/28 0400)  Intake/Output from previous day: 01/27 0701 - 01/28 0700 In: 240 [P.O.:240] Out: 1000 [Urine:1000]  Physical Exam:  GeneralMental Status- A, Ox3   Head and Neck: No JVD ThyroidGland Characteristics- no palpable nodules. no palpable enlargement.   Chest and Lung Exam: Palpation: Prolonged expiration- Both Lung Fields. Adventitious sounds:Expiratory wheeze- Both Lung Fields.  scattered rhonchi present much improved from 2 days ago.   Cardiovascular Inspection:Jugular vein- Right- No Distention. Auscultation:Heart Sounds- S1 WNL, S2 WNL and No gallop present. Note: Distant Murmurs & Other Heart Sounds: Murmur:- No murmur.   Abdomen: Palpation/Percussion:Tenderness- Right Upper Quadrant. Liver:Lower border- 4 cm below costal margin. Auscultation:Auscultation of the abdomen reveals - Bowel sounds normal.   Peripheral Vascular: Both the legs are warm. Sensation preserved. Lower Extremity:Inspectionboth legs look pink, No Varicose veins. Palpation:Both the legs are warm, Edema: Left- No edema. Right- 1 Plus pitting below knee edema. Femoral pulse:- Left- Normal. Right- Normal. Popliteal pulse:- Left- Normal. Right- Absent. Dorsalis pedis pulse- Bilateral- Absent. Posterior tibial pulse- Left- 2+. Right- Absent. Carotid arteries:- Left- No Carotid bruit. Right- No Carotid bruit. Abdomen- No prominent  abdominal aortic pulsation or epigastric bruit.  Lab Results: BMP  Recent Labs  04/21/14 0405 04/22/14 0427 04/23/14 0543  NA 126* 128* 132*  K 3.6 3.3* 4.3  CL 91* 90* 97  CO2 24 23 29   GLUCOSE 125* 270* 205*  BUN 5* 11 11  CREATININE 0.95 0.95 0.97  CALCIUM 8.6 8.8 9.0  GFRNONAA 85* 85* 85*  GFRAA >90 >90 >90    CBC  Recent Labs Lab 04/18/14 1745  04/23/14 0543  WBC 10.8*  < > 14.6*  RBC 3.88*  < > 3.38*  HGB 13.4  < > 11.1*  HCT 34.7*  < > 30.9*  PLT 193  < > 202  MCV 89.4  < > 91.4  MCH 34.5*  < > 32.8  MCHC 38.6*  < > 35.9  RDW 12.4  < > 12.3  LYMPHSABS 1.1  --   --   MONOABS 0.9  --   --   EOSABS 0.1  --   --   BASOSABS 0.0  --   --   < > = values in this interval not displayed.   CHOLESTEROL  Recent Labs  04/20/14 0414  CHOL 115    Hepatic Function Panel  Recent Labs  04/21/14 0405  PROT 6.4  ALBUMIN 3.1*  AST 60*  ALT 45  ALKPHOS 90  BILITOT 0.7    Imaging: LE venous duplex Negative for DVT right leg   Cardiac Studies:  EKG: 04/18/2014: NSR @ 83/min. Normal axis, poor R progression, CRO anterior infarct, OLD. No ischemia.  Outpatient procedures:  Echocardiogram 04/14/2014: Left ventricle cavity is normal in size. Normal global wall motion. Normal diastolic filling pattern. Visual EF is 55-60%. Calculated EF 51%. Trace mitral regurgitation. Trace tricuspid regurgitation.  Trace pulmonic regurgitation. Essentially normal echocardiogram.  Lexiscan myoview stress test 04/13/2014: 1. The resting electrocardiogram demonstrated normal sinus rhythm, normal resting conduction, poor R progression, cannot r/o anterior infarct, old. No resting arrhythmias and normal rest repolarization. Stress EKG is nondiagnostic for ischemia as it is a Pharmacologic stress test using Lexiscan infusion. Stress symptoms included dyspnea, dizziness. 2. Myocardial perfusion imaging is normal. Overall left ventricular systolic function was normal without regional  wall motion abnormalities. The left ventricular ejection fraction was 74%.  Lower extremity arterial duplex 04/09/2014: Right distal SFA occlusion with trickle flow below the knee. This exam reveals critically decreased perfusion of the right lower extremity with ABI 0.08, noted at the post tibial artery level. This exam reveals normal perfusion of the left lower extremity with ABI 1.00. Consider further work up including angiography/CT angiogram to further evaluate.   Assessment/Plan:  1. Acute delirium tremens, resolved. 2. Severe peripheral arterial disease involving the right leg, chronic non-limb threatening critical ischemia. 3. Hypertension 4. Diabetes mellitus type 2 controlled previously, now blood sugar elevated due to steroid use. Stress also leading to hyperglycemia. 5. Tobacco use disorder 6. Hyponatremia  Improving. Now off diuretics and suspect potomania   Labs 02/02/2014: BUN 9, serum creatinine 1.0. Serum sodium was 140.  Total cholesterol 236, triglycerides 43, HDL 88, LDL 139. TSH normal. CBC normal.  Recommendation:  I have met the family including his wife and also with the patient and discussed in detail that we should postpone peripheral arteriogram for now until his pneumonia is completely resolved and he is medically stable. We will continue with Percocet for pain control in the outpatient basis. I will see her back in the office in a week and try to schedule peripheral arteriogram the following week or so. With ongoing treatment of pneumonia and recent withdrawal from alcohol, I'll prefer to wait. Patient's family very appreciative of all the care that they are received. Patient appears very motivated for smoking cessation and also complete avoidance of alcohol.  Laverda Page, M.D. 04/23/2014, 6:20 PM [facility], [place] Pager: [phone] Office: [phone] If no answer: [phone]

## 2014-05-01 NOTE — Interval H&P Note (Signed)
History and Physical Interval Note:  05/01/2014 7:41 AM  Terry Munoz  has presented today for surgery, with the diagnosis of claudication  The various methods of treatment have been discussed with the patient and family. After consideration of risks, benefits and other options for treatment, the patient has consented to  Procedure(s): LOWER EXTREMITY ANGIOGRAM (N/A) and possible PTA as a surgical intervention .  The patient's history has been reviewed, patient examined, no change in status, stable for surgery.  I have reviewed the patient's chart and labs.  Questions were answered to the patient's satisfaction.     Laverda Page

## 2014-05-01 NOTE — CV Procedure (Addendum)
Procedure performed: 1. Abdominal aortogram, 2. abdominal aortogram with bifemoral runoff, cross over from the left femoral artery into the right femoral artery, 3. Chronic total occlusion, PTA and stenting of the right popliteal artery and 4. Right proximal to mid SFA with implantation of 2 overlapping 6.0 x 150 and a 6 x 100 mm Viabahn covered stents. Closure of the left femoral arterial access with vascade closure device.  Indication: Patient with chronic critical limb ischemia, non-limb threatening ongoing for the past several weeks, recently admitted to the hospital 2 weeks ago for possible referral arterial study, however had developed DTs, hence procedure had to be postponed. He is now brought back for elective peripheral arteriogram and possible antiplastic.  Angiographic data: Abdominal aortogram: The abdominal aorta shows mild atherosclerotic changes. There are 2 renal arteries one on either sides. They're widely patent. Aortoiliac bifurcation is widely patent with mild disease in the iliac vessels.  Left femoral artery and distal runoff: The left SFA shows mild disease. Below the knee there is three-vessel runoff, left posterior tibial artery has diffuse high-grade disease.  Right femoral artery and distal runoff: The right SFA is occluded in the midsegment. There is also a high-grade stenosis in the popliteal artery. The vessel reconstitutes at the level of the popliteal artery.  Below the right knee there is one-vessel runoff in the form of anterior tibial artery. The peroneal and posterior tibial artery are occluded, posterior tibial artery is occluded at the level of the ankle, this is old and there is reconstitution by collaterals.  Interventional data: I performed initial balloon angioplasty with a 5.0 x 1 50 mm Mustang at peak of 20 atmospheric pressure for 90 seconds each. Following this angiography revealed open vessel, however significant amount of thrombus burden was evident  throughout the distal SFA and the popliteal artery. I performed a thrombectomy with a 5 French endhole catheter, without significant impact on the thrombus burden.  Successful PTA and stenting of the right proximal to mid SFA with implantation of 2 overlapping 6.0 x 150 and a 6 x 100 mm Viabahn covered stents. Brisk flow without any residual thrombus or embolic complication was evident attender the procedure. Stent was post dilated with 6.0 x 2 50 mm Saber balloon performed at 3 atmospheric pressure for 90 seconds in the popliteal artery and 8 atmospheric pressure for 60 seconds in the proximal popliteal and SFA.  Patient will be discharged home today with outpatient follow-up. A total of 1 65 mL of contrast was utilized for diagnostic and invasive procedure.  Technique: Under sterile precautions using a 5 French left femoral arterial access under fluoroscopic guidance, a 5 sheath was introduced into the left common femoral artery. Using Omni Flush catheter over a Versacore wire, abdominal aortogram was performed. Same catheter was utilized to cross over from the left femoral artery into the right femoral artery, the catheter was pulled out of the body keeping the wire in place.  Technique of intervention: Using heparin for anticoagulation then switching over to Angiomax, due to thrombus,  Burden, a 7 [place] destination sheath was utilized to cross over from the left femoral artery into the right proximal superficial femoral artery. I then utilized a 5 [place] glide tip catheter for backup support, using a 290 cm x 0.035" Glidewire, I was able to cross the CTO. Angiography was performed. I then performed balloon angioplasty with a 5 mm balloon as dictated above. This was followed by significant thrombus burden that is evident, I tried to perform  thrombectomy after placing a 0.014 x 300 cm [place] core guidewire, the guidewire was withdrawn having taken a 5 [place] end hole catheter. Due to presence of  significant thrombus burden, at this point I decided to go with placing covered stents. I then readvanced the 0.014 inch wire back into the right posterior tibial artery, over this wire I performed stenting the Versacore wire was withdrawn out out of the body. The stents were placed under fluoroscopic guidance followed by deployment and repeat angiography. Patient also received 200 g of intra-arterial nitroglycerin. Following this angiography revealed excellent results. The wires were withdrawn, the sheath was withdrawn back into the left common femoral artery, left femoral arterial limb was performed followed by closure of the arterial access with Vascade. Excellent hemostasis obtained. Due to minimal capillary leak, epinephrine/lidocaine 20 mL was administered. Patient of the procedure. The was no immediate competitions.

## 2014-05-02 HISTORY — PX: POPLITEAL ARTERY ANGIOPLASTY: SHX2242

## 2014-05-02 HISTORY — PX: THROMBECTOMY: PRO61

## 2014-05-04 MED FILL — Sodium Chloride IV Soln 0.9%: INTRAVENOUS | Qty: 50 | Status: AC

## 2014-05-13 DIAGNOSIS — J029 Acute pharyngitis, unspecified: Secondary | ICD-10-CM | POA: Diagnosis not present

## 2014-05-13 DIAGNOSIS — I70209 Unspecified atherosclerosis of native arteries of extremities, unspecified extremity: Secondary | ICD-10-CM | POA: Diagnosis not present

## 2014-05-14 DIAGNOSIS — E780 Pure hypercholesterolemia: Secondary | ICD-10-CM | POA: Diagnosis not present

## 2014-05-14 DIAGNOSIS — I10 Essential (primary) hypertension: Secondary | ICD-10-CM | POA: Diagnosis not present

## 2014-05-14 DIAGNOSIS — I739 Peripheral vascular disease, unspecified: Secondary | ICD-10-CM | POA: Diagnosis not present

## 2014-06-02 DIAGNOSIS — I739 Peripheral vascular disease, unspecified: Secondary | ICD-10-CM | POA: Diagnosis not present

## 2014-06-03 DIAGNOSIS — Z6825 Body mass index (BMI) 25.0-25.9, adult: Secondary | ICD-10-CM | POA: Diagnosis not present

## 2014-06-03 DIAGNOSIS — I10 Essential (primary) hypertension: Secondary | ICD-10-CM | POA: Diagnosis not present

## 2014-06-03 DIAGNOSIS — F17200 Nicotine dependence, unspecified, uncomplicated: Secondary | ICD-10-CM | POA: Diagnosis not present

## 2014-06-03 DIAGNOSIS — E1165 Type 2 diabetes mellitus with hyperglycemia: Secondary | ICD-10-CM | POA: Diagnosis not present

## 2014-06-12 DIAGNOSIS — I739 Peripheral vascular disease, unspecified: Secondary | ICD-10-CM | POA: Diagnosis not present

## 2014-07-01 DIAGNOSIS — D692 Other nonthrombocytopenic purpura: Secondary | ICD-10-CM | POA: Diagnosis not present

## 2014-07-01 DIAGNOSIS — L509 Urticaria, unspecified: Secondary | ICD-10-CM | POA: Diagnosis not present

## 2014-07-01 DIAGNOSIS — L821 Other seborrheic keratosis: Secondary | ICD-10-CM | POA: Diagnosis not present

## 2014-07-01 DIAGNOSIS — S80862A Insect bite (nonvenomous), left lower leg, initial encounter: Secondary | ICD-10-CM | POA: Diagnosis not present

## 2014-07-01 DIAGNOSIS — L570 Actinic keratosis: Secondary | ICD-10-CM | POA: Diagnosis not present

## 2014-07-01 DIAGNOSIS — Z85828 Personal history of other malignant neoplasm of skin: Secondary | ICD-10-CM | POA: Diagnosis not present

## 2014-08-27 DIAGNOSIS — D485 Neoplasm of uncertain behavior of skin: Secondary | ICD-10-CM | POA: Diagnosis not present

## 2014-08-27 DIAGNOSIS — L821 Other seborrheic keratosis: Secondary | ICD-10-CM | POA: Diagnosis not present

## 2014-08-27 DIAGNOSIS — C44622 Squamous cell carcinoma of skin of right upper limb, including shoulder: Secondary | ICD-10-CM | POA: Diagnosis not present

## 2014-09-08 DIAGNOSIS — Z6826 Body mass index (BMI) 26.0-26.9, adult: Secondary | ICD-10-CM | POA: Diagnosis not present

## 2014-09-08 DIAGNOSIS — I10 Essential (primary) hypertension: Secondary | ICD-10-CM | POA: Diagnosis not present

## 2014-09-08 DIAGNOSIS — E1165 Type 2 diabetes mellitus with hyperglycemia: Secondary | ICD-10-CM | POA: Diagnosis not present

## 2014-09-11 DIAGNOSIS — C44622 Squamous cell carcinoma of skin of right upper limb, including shoulder: Secondary | ICD-10-CM | POA: Diagnosis not present

## 2014-12-01 DIAGNOSIS — I739 Peripheral vascular disease, unspecified: Secondary | ICD-10-CM | POA: Diagnosis not present

## 2014-12-04 DIAGNOSIS — H25043 Posterior subcapsular polar age-related cataract, bilateral: Secondary | ICD-10-CM | POA: Diagnosis not present

## 2014-12-04 DIAGNOSIS — H25013 Cortical age-related cataract, bilateral: Secondary | ICD-10-CM | POA: Diagnosis not present

## 2014-12-04 DIAGNOSIS — H18413 Arcus senilis, bilateral: Secondary | ICD-10-CM | POA: Diagnosis not present

## 2014-12-04 DIAGNOSIS — H2513 Age-related nuclear cataract, bilateral: Secondary | ICD-10-CM | POA: Diagnosis not present

## 2014-12-09 DIAGNOSIS — I10 Essential (primary) hypertension: Secondary | ICD-10-CM | POA: Diagnosis not present

## 2014-12-09 DIAGNOSIS — E1165 Type 2 diabetes mellitus with hyperglycemia: Secondary | ICD-10-CM | POA: Diagnosis not present

## 2014-12-21 DIAGNOSIS — I739 Peripheral vascular disease, unspecified: Secondary | ICD-10-CM | POA: Diagnosis not present

## 2014-12-21 DIAGNOSIS — Z87891 Personal history of nicotine dependence: Secondary | ICD-10-CM | POA: Diagnosis not present

## 2014-12-21 DIAGNOSIS — I10 Essential (primary) hypertension: Secondary | ICD-10-CM | POA: Diagnosis not present

## 2014-12-31 DIAGNOSIS — L728 Other follicular cysts of the skin and subcutaneous tissue: Secondary | ICD-10-CM | POA: Diagnosis not present

## 2014-12-31 DIAGNOSIS — L570 Actinic keratosis: Secondary | ICD-10-CM | POA: Diagnosis not present

## 2014-12-31 DIAGNOSIS — L812 Freckles: Secondary | ICD-10-CM | POA: Diagnosis not present

## 2014-12-31 DIAGNOSIS — D485 Neoplasm of uncertain behavior of skin: Secondary | ICD-10-CM | POA: Diagnosis not present

## 2014-12-31 DIAGNOSIS — D0462 Carcinoma in situ of skin of left upper limb, including shoulder: Secondary | ICD-10-CM | POA: Diagnosis not present

## 2014-12-31 DIAGNOSIS — L821 Other seborrheic keratosis: Secondary | ICD-10-CM | POA: Diagnosis not present

## 2015-01-15 DIAGNOSIS — R209 Unspecified disturbances of skin sensation: Secondary | ICD-10-CM | POA: Diagnosis not present

## 2015-01-15 DIAGNOSIS — F17200 Nicotine dependence, unspecified, uncomplicated: Secondary | ICD-10-CM | POA: Diagnosis not present

## 2015-01-15 DIAGNOSIS — E785 Hyperlipidemia, unspecified: Secondary | ICD-10-CM | POA: Diagnosis not present

## 2015-01-15 DIAGNOSIS — I10 Essential (primary) hypertension: Secondary | ICD-10-CM | POA: Diagnosis not present

## 2015-01-15 DIAGNOSIS — Z6825 Body mass index (BMI) 25.0-25.9, adult: Secondary | ICD-10-CM | POA: Diagnosis not present

## 2015-01-15 DIAGNOSIS — E1165 Type 2 diabetes mellitus with hyperglycemia: Secondary | ICD-10-CM | POA: Diagnosis not present

## 2015-01-20 DIAGNOSIS — L570 Actinic keratosis: Secondary | ICD-10-CM | POA: Diagnosis not present

## 2015-01-20 DIAGNOSIS — D045 Carcinoma in situ of skin of trunk: Secondary | ICD-10-CM | POA: Diagnosis not present

## 2015-03-04 DIAGNOSIS — J0100 Acute maxillary sinusitis, unspecified: Secondary | ICD-10-CM | POA: Diagnosis not present

## 2015-04-15 DIAGNOSIS — E1165 Type 2 diabetes mellitus with hyperglycemia: Secondary | ICD-10-CM | POA: Diagnosis not present

## 2015-04-15 DIAGNOSIS — Z6826 Body mass index (BMI) 26.0-26.9, adult: Secondary | ICD-10-CM | POA: Diagnosis not present

## 2015-04-15 DIAGNOSIS — I10 Essential (primary) hypertension: Secondary | ICD-10-CM | POA: Diagnosis not present

## 2015-05-18 DIAGNOSIS — E1159 Type 2 diabetes mellitus with other circulatory complications: Secondary | ICD-10-CM | POA: Diagnosis not present

## 2015-05-18 DIAGNOSIS — Z6826 Body mass index (BMI) 26.0-26.9, adult: Secondary | ICD-10-CM | POA: Diagnosis not present

## 2015-05-18 DIAGNOSIS — I10 Essential (primary) hypertension: Secondary | ICD-10-CM | POA: Diagnosis not present

## 2015-05-18 DIAGNOSIS — Z1389 Encounter for screening for other disorder: Secondary | ICD-10-CM | POA: Diagnosis not present

## 2015-05-18 DIAGNOSIS — I7389 Other specified peripheral vascular diseases: Secondary | ICD-10-CM | POA: Diagnosis not present

## 2015-05-18 DIAGNOSIS — K219 Gastro-esophageal reflux disease without esophagitis: Secondary | ICD-10-CM | POA: Diagnosis not present

## 2015-05-18 DIAGNOSIS — F17200 Nicotine dependence, unspecified, uncomplicated: Secondary | ICD-10-CM | POA: Diagnosis not present

## 2015-05-18 DIAGNOSIS — E784 Other hyperlipidemia: Secondary | ICD-10-CM | POA: Diagnosis not present

## 2015-06-15 DIAGNOSIS — I739 Peripheral vascular disease, unspecified: Secondary | ICD-10-CM | POA: Diagnosis not present

## 2015-06-23 DIAGNOSIS — E7800 Pure hypercholesterolemia, unspecified: Secondary | ICD-10-CM | POA: Diagnosis not present

## 2015-06-23 DIAGNOSIS — I739 Peripheral vascular disease, unspecified: Secondary | ICD-10-CM | POA: Diagnosis not present

## 2015-06-23 DIAGNOSIS — I10 Essential (primary) hypertension: Secondary | ICD-10-CM | POA: Diagnosis not present

## 2015-06-23 DIAGNOSIS — E118 Type 2 diabetes mellitus with unspecified complications: Secondary | ICD-10-CM | POA: Diagnosis not present

## 2015-07-07 DIAGNOSIS — L821 Other seborrheic keratosis: Secondary | ICD-10-CM | POA: Diagnosis not present

## 2015-07-07 DIAGNOSIS — D485 Neoplasm of uncertain behavior of skin: Secondary | ICD-10-CM | POA: Diagnosis not present

## 2015-07-07 DIAGNOSIS — Z85828 Personal history of other malignant neoplasm of skin: Secondary | ICD-10-CM | POA: Diagnosis not present

## 2015-07-07 DIAGNOSIS — L812 Freckles: Secondary | ICD-10-CM | POA: Diagnosis not present

## 2015-07-07 DIAGNOSIS — L570 Actinic keratosis: Secondary | ICD-10-CM | POA: Diagnosis not present

## 2015-07-07 DIAGNOSIS — C44329 Squamous cell carcinoma of skin of other parts of face: Secondary | ICD-10-CM | POA: Diagnosis not present

## 2015-07-15 DIAGNOSIS — Z6826 Body mass index (BMI) 26.0-26.9, adult: Secondary | ICD-10-CM | POA: Diagnosis not present

## 2015-07-15 DIAGNOSIS — I10 Essential (primary) hypertension: Secondary | ICD-10-CM | POA: Diagnosis not present

## 2015-07-15 DIAGNOSIS — E1159 Type 2 diabetes mellitus with other circulatory complications: Secondary | ICD-10-CM | POA: Diagnosis not present

## 2015-07-24 ENCOUNTER — Encounter (HOSPITAL_COMMUNITY)
Admission: EM | Disposition: A | Discharge status: Home Health | Payer: Self-pay | Source: Home / Self Care | Attending: Vascular Surgery

## 2015-07-24 ENCOUNTER — Emergency Department (HOSPITAL_COMMUNITY): Payer: Medicare Other

## 2015-07-24 ENCOUNTER — Emergency Department (HOSPITAL_COMMUNITY): Payer: Medicare Other | Admitting: Anesthesiology

## 2015-07-24 ENCOUNTER — Encounter (HOSPITAL_COMMUNITY): Payer: Self-pay | Admitting: Neurology

## 2015-07-24 ENCOUNTER — Inpatient Hospital Stay (HOSPITAL_COMMUNITY)
Admission: EM | Admit: 2015-07-24 | Discharge: 2015-07-28 | DRG: 272 | Disposition: A | Discharge status: Home Health | Payer: Medicare Other | Attending: Vascular Surgery | Admitting: Vascular Surgery

## 2015-07-24 DIAGNOSIS — R52 Pain, unspecified: Secondary | ICD-10-CM | POA: Diagnosis not present

## 2015-07-24 DIAGNOSIS — Z79899 Other long term (current) drug therapy: Secondary | ICD-10-CM | POA: Diagnosis not present

## 2015-07-24 DIAGNOSIS — E119 Type 2 diabetes mellitus without complications: Secondary | ICD-10-CM | POA: Diagnosis present

## 2015-07-24 DIAGNOSIS — T82868A Thrombosis of vascular prosthetic devices, implants and grafts, initial encounter: Principal | ICD-10-CM | POA: Diagnosis present

## 2015-07-24 DIAGNOSIS — G4700 Insomnia, unspecified: Secondary | ICD-10-CM | POA: Diagnosis present

## 2015-07-24 DIAGNOSIS — I743 Embolism and thrombosis of arteries of the lower extremities: Secondary | ICD-10-CM | POA: Diagnosis not present

## 2015-07-24 DIAGNOSIS — R0682 Tachypnea, not elsewhere classified: Secondary | ICD-10-CM | POA: Diagnosis not present

## 2015-07-24 DIAGNOSIS — Z87891 Personal history of nicotine dependence: Secondary | ICD-10-CM

## 2015-07-24 DIAGNOSIS — Z794 Long term (current) use of insulin: Secondary | ICD-10-CM

## 2015-07-24 DIAGNOSIS — M1990 Unspecified osteoarthritis, unspecified site: Secondary | ICD-10-CM | POA: Diagnosis present

## 2015-07-24 DIAGNOSIS — Z419 Encounter for procedure for purposes other than remedying health state, unspecified: Secondary | ICD-10-CM

## 2015-07-24 DIAGNOSIS — R200 Anesthesia of skin: Secondary | ICD-10-CM | POA: Diagnosis not present

## 2015-07-24 DIAGNOSIS — I10 Essential (primary) hypertension: Secondary | ICD-10-CM | POA: Diagnosis present

## 2015-07-24 DIAGNOSIS — I739 Peripheral vascular disease, unspecified: Secondary | ICD-10-CM | POA: Diagnosis not present

## 2015-07-24 DIAGNOSIS — I70201 Unspecified atherosclerosis of native arteries of extremities, right leg: Secondary | ICD-10-CM | POA: Diagnosis present

## 2015-07-24 DIAGNOSIS — J449 Chronic obstructive pulmonary disease, unspecified: Secondary | ICD-10-CM | POA: Diagnosis present

## 2015-07-24 DIAGNOSIS — Z7982 Long term (current) use of aspirin: Secondary | ICD-10-CM | POA: Diagnosis not present

## 2015-07-24 DIAGNOSIS — E118 Type 2 diabetes mellitus with unspecified complications: Secondary | ICD-10-CM | POA: Diagnosis not present

## 2015-07-24 DIAGNOSIS — M79604 Pain in right leg: Secondary | ICD-10-CM | POA: Diagnosis not present

## 2015-07-24 DIAGNOSIS — Y832 Surgical operation with anastomosis, bypass or graft as the cause of abnormal reaction of the patient, or of later complication, without mention of misadventure at the time of the procedure: Secondary | ICD-10-CM | POA: Diagnosis present

## 2015-07-24 DIAGNOSIS — I998 Other disorder of circulatory system: Secondary | ICD-10-CM

## 2015-07-24 DIAGNOSIS — R0902 Hypoxemia: Secondary | ICD-10-CM | POA: Diagnosis not present

## 2015-07-24 DIAGNOSIS — Z7984 Long term (current) use of oral hypoglycemic drugs: Secondary | ICD-10-CM | POA: Diagnosis not present

## 2015-07-24 DIAGNOSIS — E7800 Pure hypercholesterolemia, unspecified: Secondary | ICD-10-CM | POA: Diagnosis not present

## 2015-07-24 DIAGNOSIS — K8689 Other specified diseases of pancreas: Secondary | ICD-10-CM | POA: Diagnosis not present

## 2015-07-24 HISTORY — PX: FEMORAL-POPLITEAL BYPASS GRAFT: SHX937

## 2015-07-24 LAB — I-STAT CHEM 8, ED
BUN: 16 mg/dL (ref 6–20)
CALCIUM ION: 1.09 mmol/L — AB (ref 1.13–1.30)
CHLORIDE: 103 mmol/L (ref 101–111)
Creatinine, Ser: 1.1 mg/dL (ref 0.61–1.24)
GLUCOSE: 136 mg/dL — AB (ref 65–99)
HCT: 50 % (ref 39.0–52.0)
Hemoglobin: 17 g/dL (ref 13.0–17.0)
Potassium: 3.6 mmol/L (ref 3.5–5.1)
SODIUM: 140 mmol/L (ref 135–145)
TCO2: 22 mmol/L (ref 0–100)

## 2015-07-24 LAB — CREATININE, SERUM
CREATININE: 1.24 mg/dL (ref 0.61–1.24)
GFR, EST NON AFRICAN AMERICAN: 59 mL/min — AB (ref >60)

## 2015-07-24 LAB — CBC
HCT: 44.4 % (ref 39.0–52.0)
Hemoglobin: 14.7 g/dL (ref 13.0–17.0)
MCH: 32.7 pg (ref 26.0–34.0)
MCHC: 33.1 g/dL (ref 30.0–36.0)
MCV: 98.9 fL (ref 78.0–100.0)
PLATELETS: 176 10*3/uL (ref 150–400)
RBC: 4.49 MIL/uL (ref 4.22–5.81)
RDW: 13.1 % (ref 11.5–15.5)
WBC: 13.5 10*3/uL — AB (ref 4.0–10.5)

## 2015-07-24 LAB — ABO/RH: ABO/RH(D): A POS

## 2015-07-24 LAB — GLUCOSE, CAPILLARY
Glucose-Capillary: 128 mg/dL — ABNORMAL HIGH (ref 65–99)
Glucose-Capillary: 92 mg/dL (ref 65–99)
Glucose-Capillary: 70 mg/dL (ref 65–99)

## 2015-07-24 LAB — CBC WITH DIFFERENTIAL/PLATELET
BASOS PCT: 0 %
Basophils Absolute: 0 10*3/uL (ref 0.0–0.1)
EOS ABS: 0.1 10*3/uL (ref 0.0–0.7)
EOS PCT: 1 %
HCT: 46.6 % (ref 39.0–52.0)
HEMOGLOBIN: 15.5 g/dL (ref 13.0–17.0)
LYMPHS ABS: 1.8 10*3/uL (ref 0.7–4.0)
Lymphocytes Relative: 19 %
MCH: 32.4 pg (ref 26.0–34.0)
MCHC: 33.3 g/dL (ref 30.0–36.0)
MCV: 97.5 fL (ref 78.0–100.0)
Monocytes Absolute: 0.7 10*3/uL (ref 0.1–1.0)
Monocytes Relative: 8 %
NEUTROS PCT: 72 %
Neutro Abs: 6.7 10*3/uL (ref 1.7–7.7)
PLATELETS: 186 10*3/uL (ref 150–400)
RBC: 4.78 MIL/uL (ref 4.22–5.81)
RDW: 13 % (ref 11.5–15.5)
WBC: 9.3 10*3/uL (ref 4.0–10.5)

## 2015-07-24 LAB — COMPREHENSIVE METABOLIC PANEL
ALT: 31 U/L (ref 17–63)
ANION GAP: 15 (ref 5–15)
AST: 28 U/L (ref 15–41)
Albumin: 4.3 g/dL (ref 3.5–5.0)
Alkaline Phosphatase: 69 U/L (ref 38–126)
BUN: 12 mg/dL (ref 6–20)
CHLORIDE: 103 mmol/L (ref 101–111)
CO2: 22 mmol/L (ref 22–32)
Calcium: 9.8 mg/dL (ref 8.9–10.3)
Creatinine, Ser: 1.23 mg/dL (ref 0.61–1.24)
GFR calc non Af Amer: 59 mL/min — ABNORMAL LOW (ref >60)
Glucose, Bld: 131 mg/dL — ABNORMAL HIGH (ref 65–99)
Potassium: 3.7 mmol/L (ref 3.5–5.1)
SODIUM: 140 mmol/L (ref 135–145)
Total Bilirubin: 0.8 mg/dL (ref 0.3–1.2)
Total Protein: 7.5 g/dL (ref 6.5–8.1)

## 2015-07-24 LAB — POCT I-STAT 4, (NA,K, GLUC, HGB,HCT)
Glucose, Bld: 137 mg/dL — ABNORMAL HIGH (ref 65–99)
HEMATOCRIT: 42 % (ref 39.0–52.0)
HEMOGLOBIN: 14.3 g/dL (ref 13.0–17.0)
POTASSIUM: 4.1 mmol/L (ref 3.5–5.1)
SODIUM: 139 mmol/L (ref 135–145)

## 2015-07-24 LAB — PROTIME-INR
INR: 1 (ref 0.00–1.49)
PROTHROMBIN TIME: 13.4 s (ref 11.6–15.2)

## 2015-07-24 LAB — TYPE AND SCREEN
ABO/RH(D): A POS
Antibody Screen: NEGATIVE

## 2015-07-24 LAB — MRSA PCR SCREENING: MRSA BY PCR: NEGATIVE

## 2015-07-24 SURGERY — BYPASS GRAFT FEMORAL-POPLITEAL ARTERY
Anesthesia: General | Laterality: Right

## 2015-07-24 MED ORDER — MAGNESIUM SULFATE 2 GM/50ML IV SOLN
2.0000 g | Freq: Every day | INTRAVENOUS | Status: DC | PRN
  Filled 2015-07-24: qty 50

## 2015-07-24 MED ORDER — IOPAMIDOL (ISOVUE-300) INJECTION 61%
INTRAVENOUS | Status: DC | PRN
  Administered 2015-07-24: 50 mL via INTRA_ARTERIAL

## 2015-07-24 MED ORDER — AMITRIPTYLINE HCL 50 MG PO TABS
50.0000 mg | ORAL_TABLET | Freq: Every day | ORAL | Status: DC
  Administered 2015-07-24 – 2015-07-27 (×4): 50 mg via ORAL
  Filled 2015-07-24 (×5): qty 1

## 2015-07-24 MED ORDER — IOPAMIDOL (ISOVUE-300) INJECTION 61%
INTRAVENOUS | Status: AC
  Filled 2015-07-24: qty 50

## 2015-07-24 MED ORDER — DEXTROSE 5 % IV SOLN
INTRAVENOUS | Status: AC
  Administered 2015-07-24: 1.5 g via INTRAVENOUS
  Filled 2015-07-24: qty 1.5

## 2015-07-24 MED ORDER — ALBUTEROL SULFATE (2.5 MG/3ML) 0.083% IN NEBU: 2.5000 mg | INHALATION_SOLUTION | Freq: Four times a day (QID) | RESPIRATORY_TRACT | Status: DC | PRN

## 2015-07-24 MED ORDER — MORPHINE SULFATE (PF) 2 MG/ML IV SOLN: 2.0000 mg | INTRAVENOUS | Status: DC | PRN

## 2015-07-24 MED ORDER — LACTATED RINGERS IV SOLN
INTRAVENOUS | Status: DC | PRN
  Administered 2015-07-24 (×2): via INTRAVENOUS

## 2015-07-24 MED ORDER — GLIMEPIRIDE 4 MG PO TABS
4.0000 mg | ORAL_TABLET | Freq: Two times a day (BID) | ORAL | Status: DC
  Administered 2015-07-25 – 2015-07-28 (×6): 4 mg via ORAL
  Filled 2015-07-24 (×9): qty 1

## 2015-07-24 MED ORDER — POTASSIUM CHLORIDE CRYS ER 10 MEQ PO TBCR
10.0000 meq | EXTENDED_RELEASE_TABLET | Freq: Every day | ORAL | Status: DC
  Administered 2015-07-25 – 2015-07-28 (×4): 10 meq via ORAL
  Filled 2015-07-24 (×4): qty 1

## 2015-07-24 MED ORDER — FOLIC ACID 1 MG PO TABS
1.0000 mg | ORAL_TABLET | Freq: Every day | ORAL | Status: DC
  Administered 2015-07-25 – 2015-07-28 (×4): 1 mg via ORAL
  Filled 2015-07-24 (×4): qty 1

## 2015-07-24 MED ORDER — PANTOPRAZOLE SODIUM 40 MG PO TBEC: 40.0000 mg | DELAYED_RELEASE_TABLET | Freq: Every day | ORAL | Status: DC

## 2015-07-24 MED ORDER — VITAMIN B-1 100 MG PO TABS
100.0000 mg | ORAL_TABLET | Freq: Every day | ORAL | Status: DC
  Administered 2015-07-25 – 2015-07-28 (×4): 100 mg via ORAL
  Filled 2015-07-24 (×4): qty 1

## 2015-07-24 MED ORDER — HYDROMORPHONE HCL 1 MG/ML IJ SOLN
0.2500 mg | INTRAMUSCULAR | Status: DC | PRN
  Administered 2015-07-24 (×2): 0.5 mg via INTRAVENOUS

## 2015-07-24 MED ORDER — GUAIFENESIN-DM 100-10 MG/5ML PO SYRP: 15.0000 mL | ORAL_SOLUTION | ORAL | Status: DC | PRN

## 2015-07-24 MED ORDER — DOCUSATE SODIUM 100 MG PO CAPS
100.0000 mg | ORAL_CAPSULE | Freq: Every day | ORAL | Status: DC
  Administered 2015-07-25 – 2015-07-27 (×3): 100 mg via ORAL
  Filled 2015-07-24 (×3): qty 1

## 2015-07-24 MED ORDER — ACETAMINOPHEN 325 MG PO TABS: 325.0000 mg | ORAL_TABLET | ORAL | Status: DC | PRN

## 2015-07-24 MED ORDER — ROCURONIUM BROMIDE 100 MG/10ML IV SOLN
INTRAVENOUS | Status: DC | PRN
  Administered 2015-07-24: 50 mg via INTRAVENOUS

## 2015-07-24 MED ORDER — LABETALOL HCL 5 MG/ML IV SOLN
10.0000 mg | INTRAVENOUS | Status: DC | PRN
  Administered 2015-07-24: 10 mg via INTRAVENOUS
  Filled 2015-07-24: qty 4

## 2015-07-24 MED ORDER — ALBUTEROL SULFATE HFA 108 (90 BASE) MCG/ACT IN AERS: 1.0000 | INHALATION_SPRAY | Freq: Four times a day (QID) | RESPIRATORY_TRACT | Status: DC | PRN

## 2015-07-24 MED ORDER — ONDANSETRON HCL 4 MG/2ML IJ SOLN: 4.0000 mg | Freq: Once | INTRAMUSCULAR | Status: DC | PRN

## 2015-07-24 MED ORDER — HYDROMORPHONE HCL 1 MG/ML IJ SOLN
1.0000 mg | Freq: Once | INTRAMUSCULAR | Status: AC
  Administered 2015-07-24: 1 mg via INTRAVENOUS
  Filled 2015-07-24: qty 1

## 2015-07-24 MED ORDER — PHENYLEPHRINE HCL 10 MG/ML IJ SOLN
10.0000 mg | INTRAVENOUS | Status: DC | PRN
  Administered 2015-07-24: 40 ug/min via INTRAVENOUS

## 2015-07-24 MED ORDER — HEPARIN (PORCINE) IN NACL 100-0.45 UNIT/ML-% IJ SOLN
1200.0000 [IU]/h | INTRAMUSCULAR | Status: DC
  Administered 2015-07-24 (×2): 1200 [IU]/h via INTRAVENOUS
  Filled 2015-07-24: qty 250

## 2015-07-24 MED ORDER — SODIUM CHLORIDE 0.9 % IV SOLN
INTRAVENOUS | Status: DC
  Administered 2015-07-24 – 2015-07-26 (×3): via INTRAVENOUS

## 2015-07-24 MED ORDER — HEPARIN SODIUM (PORCINE) 1000 UNIT/ML IJ SOLN
INTRAMUSCULAR | Status: AC
  Filled 2015-07-24: qty 1

## 2015-07-24 MED ORDER — PROTAMINE SULFATE 10 MG/ML IV SOLN
INTRAVENOUS | Status: DC | PRN
  Administered 2015-07-24: 30 mg via INTRAVENOUS
  Administered 2015-07-24: 20 mg via INTRAVENOUS

## 2015-07-24 MED ORDER — PHENYLEPHRINE HCL 10 MG/ML IJ SOLN
INTRAMUSCULAR | Status: DC | PRN
  Administered 2015-07-24 (×6): 80 ug via INTRAVENOUS

## 2015-07-24 MED ORDER — HEPARIN SODIUM (PORCINE) 1000 UNIT/ML IJ SOLN
INTRAMUSCULAR | Status: DC | PRN
  Administered 2015-07-24: 8000 [IU] via INTRAVENOUS
  Administered 2015-07-24: 2000 [IU] via INTRAVENOUS

## 2015-07-24 MED ORDER — PROTAMINE SULFATE 10 MG/ML IV SOLN
INTRAVENOUS | Status: AC
  Filled 2015-07-24: qty 5

## 2015-07-24 MED ORDER — CLOPIDOGREL BISULFATE 75 MG PO TABS
75.0000 mg | ORAL_TABLET | Freq: Every day | ORAL | Status: DC
  Administered 2015-07-24 – 2015-07-28 (×5): 75 mg via ORAL
  Filled 2015-07-24 (×6): qty 1

## 2015-07-24 MED ORDER — ALUM & MAG HYDROXIDE-SIMETH 200-200-20 MG/5ML PO SUSP
15.0000 mL | ORAL | Status: DC | PRN
Start: 2015-07-24 — End: 2015-07-28
  Administered 2015-07-24: 30 mL via ORAL
  Filled 2015-07-24 (×3): qty 30

## 2015-07-24 MED ORDER — PROTAMINE SULFATE 10 MG/ML IV SOLN
INTRAVENOUS | Status: AC
  Filled 2015-07-24: qty 25

## 2015-07-24 MED ORDER — SUGAMMADEX SODIUM 200 MG/2ML IV SOLN
INTRAVENOUS | Status: DC | PRN
  Administered 2015-07-24: 200 mg via INTRAVENOUS

## 2015-07-24 MED ORDER — FENTANYL CITRATE (PF) 250 MCG/5ML IJ SOLN
INTRAMUSCULAR | Status: DC | PRN
  Administered 2015-07-24: 50 ug via INTRAVENOUS
  Administered 2015-07-24: 150 ug via INTRAVENOUS
  Administered 2015-07-24: 50 ug via INTRAVENOUS

## 2015-07-24 MED ORDER — IOPAMIDOL (ISOVUE-300) INJECTION 61%
INTRAVENOUS | Status: DC | PRN
  Administered 2015-07-24: 30 mL via INTRA_ARTERIAL

## 2015-07-24 MED ORDER — IRBESARTAN 150 MG PO TABS
75.0000 mg | ORAL_TABLET | Freq: Every day | ORAL | Status: DC
  Administered 2015-07-25 – 2015-07-28 (×4): 75 mg via ORAL
  Filled 2015-07-24 (×4): qty 1

## 2015-07-24 MED ORDER — PANTOPRAZOLE SODIUM 40 MG PO TBEC
40.0000 mg | DELAYED_RELEASE_TABLET | Freq: Every day | ORAL | Status: DC
  Administered 2015-07-24 – 2015-07-28 (×5): 40 mg via ORAL
  Filled 2015-07-24 (×5): qty 1

## 2015-07-24 MED ORDER — PHENYLEPHRINE 40 MCG/ML (10ML) SYRINGE FOR IV PUSH (FOR BLOOD PRESSURE SUPPORT)
PREFILLED_SYRINGE | INTRAVENOUS | Status: AC
  Filled 2015-07-24: qty 10

## 2015-07-24 MED ORDER — LIDOCAINE HCL (CARDIAC) 20 MG/ML IV SOLN
INTRAVENOUS | Status: DC | PRN
  Administered 2015-07-24: 100 mg via INTRAVENOUS

## 2015-07-24 MED ORDER — POLYETHYLENE GLYCOL 3350 17 G PO PACK: 17.0000 g | PACK | Freq: Every day | ORAL | Status: DC | PRN

## 2015-07-24 MED ORDER — INSULIN ASPART 100 UNIT/ML ~~LOC~~ SOLN
0.0000 [IU] | Freq: Three times a day (TID) | SUBCUTANEOUS | Status: DC
  Administered 2015-07-25: 2 [IU] via SUBCUTANEOUS
  Administered 2015-07-26 (×2): 3 [IU] via SUBCUTANEOUS
  Administered 2015-07-27 (×2): 2 [IU] via SUBCUTANEOUS

## 2015-07-24 MED ORDER — SODIUM CHLORIDE 0.9 % IV SOLN: 500.0000 mL | Freq: Once | INTRAVENOUS | Status: DC | PRN

## 2015-07-24 MED ORDER — PHENYLEPHRINE 40 MCG/ML (10ML) SYRINGE FOR IV PUSH (FOR BLOOD PRESSURE SUPPORT)
PREFILLED_SYRINGE | INTRAVENOUS | Status: AC
  Filled 2015-07-24: qty 30

## 2015-07-24 MED ORDER — POTASSIUM CHLORIDE CRYS ER 20 MEQ PO TBCR: 20.0000 meq | EXTENDED_RELEASE_TABLET | Freq: Every day | ORAL | Status: DC | PRN

## 2015-07-24 MED ORDER — HEPARIN (PORCINE) IN NACL 100-0.45 UNIT/ML-% IJ SOLN
1100.0000 [IU]/h | INTRAMUSCULAR | Status: DC
  Administered 2015-07-25 – 2015-07-26 (×2): 1100 [IU]/h via INTRAVENOUS
  Filled 2015-07-24 (×3): qty 250

## 2015-07-24 MED ORDER — DEXTROSE 5 % IV SOLN
1.5000 g | Freq: Two times a day (BID) | INTRAVENOUS | Status: AC
  Administered 2015-07-24 – 2015-07-25 (×2): 1.5 g via INTRAVENOUS
  Filled 2015-07-24 (×2): qty 1.5

## 2015-07-24 MED ORDER — MOMETASONE FURO-FORMOTEROL FUM 200-5 MCG/ACT IN AERO
2.0000 | INHALATION_SPRAY | Freq: Two times a day (BID) | RESPIRATORY_TRACT | Status: DC
  Administered 2015-07-24 – 2015-07-28 (×8): 2 via RESPIRATORY_TRACT
  Filled 2015-07-24 (×2): qty 8.8

## 2015-07-24 MED ORDER — ONDANSETRON HCL 4 MG/2ML IJ SOLN: 4.0000 mg | Freq: Four times a day (QID) | INTRAMUSCULAR | Status: DC | PRN

## 2015-07-24 MED ORDER — ATORVASTATIN CALCIUM 40 MG PO TABS
40.0000 mg | ORAL_TABLET | Freq: Every day | ORAL | Status: DC
  Administered 2015-07-25 – 2015-07-28 (×4): 40 mg via ORAL
  Filled 2015-07-24 (×4): qty 1

## 2015-07-24 MED ORDER — ENOXAPARIN SODIUM 40 MG/0.4ML ~~LOC~~ SOLN
40.0000 mg | SUBCUTANEOUS | Status: DC
  Filled 2015-07-24: qty 0.4

## 2015-07-24 MED ORDER — PROPOFOL 10 MG/ML IV BOLUS
INTRAVENOUS | Status: AC
  Filled 2015-07-24: qty 20

## 2015-07-24 MED ORDER — MIDAZOLAM HCL 5 MG/5ML IJ SOLN
INTRAMUSCULAR | Status: DC | PRN
  Administered 2015-07-24: 2 mg via INTRAVENOUS

## 2015-07-24 MED ORDER — ONDANSETRON HCL 4 MG/2ML IJ SOLN
INTRAMUSCULAR | Status: AC
  Filled 2015-07-24: qty 2

## 2015-07-24 MED ORDER — INSULIN GLARGINE 100 UNIT/ML ~~LOC~~ SOLN
30.0000 [IU] | Freq: Every day | SUBCUTANEOUS | Status: DC
  Administered 2015-07-26 – 2015-07-28 (×3): 30 [IU] via SUBCUTANEOUS
  Filled 2015-07-24 (×4): qty 0.3

## 2015-07-24 MED ORDER — MEPERIDINE HCL 25 MG/ML IJ SOLN: 6.2500 mg | INTRAMUSCULAR | Status: DC | PRN

## 2015-07-24 MED ORDER — OXYCODONE HCL 5 MG PO TABS
5.0000 mg | ORAL_TABLET | ORAL | Status: DC | PRN
  Administered 2015-07-25 – 2015-07-27 (×4): 10 mg via ORAL
  Filled 2015-07-24 (×4): qty 2

## 2015-07-24 MED ORDER — HEPARIN BOLUS VIA INFUSION
4000.0000 [IU] | Freq: Once | INTRAVENOUS | Status: AC
  Administered 2015-07-24: 4000 [IU] via INTRAVENOUS
  Filled 2015-07-24: qty 4000

## 2015-07-24 MED ORDER — MIDAZOLAM HCL 2 MG/2ML IJ SOLN
INTRAMUSCULAR | Status: AC
  Filled 2015-07-24: qty 2

## 2015-07-24 MED ORDER — PROPOFOL 10 MG/ML IV BOLUS
INTRAVENOUS | Status: DC | PRN
  Administered 2015-07-24: 70 mg via INTRAVENOUS
  Administered 2015-07-24: 130 mg via INTRAVENOUS

## 2015-07-24 MED ORDER — IOPAMIDOL (ISOVUE-300) INJECTION 61%
INTRAVENOUS | Status: AC
Start: 2015-07-24 — End: 2015-07-24
  Filled 2015-07-24: qty 50

## 2015-07-24 MED ORDER — HYDROMORPHONE HCL 1 MG/ML IJ SOLN
INTRAMUSCULAR | Status: AC
  Administered 2015-07-24: 1 mg
  Filled 2015-07-24: qty 1

## 2015-07-24 MED ORDER — CANAGLIFLOZIN 100 MG PO TABS
100.0000 mg | ORAL_TABLET | Freq: Every day | ORAL | Status: DC
  Administered 2015-07-26 – 2015-07-28 (×3): 100 mg via ORAL
  Filled 2015-07-24 (×4): qty 1

## 2015-07-24 MED ORDER — FLEET ENEMA 7-19 GM/118ML RE ENEM: 1.0000 | ENEMA | Freq: Once | RECTAL | Status: DC | PRN

## 2015-07-24 MED ORDER — METOPROLOL TARTRATE 5 MG/5ML IV SOLN: 2.0000 mg | INTRAVENOUS | Status: DC | PRN

## 2015-07-24 MED ORDER — VASOPRESSIN 20 UNIT/ML IV SOLN
INTRAVENOUS | Status: AC
  Filled 2015-07-24: qty 1

## 2015-07-24 MED ORDER — HEPARIN SODIUM (PORCINE) 1000 UNIT/ML IJ SOLN
INTRAMUSCULAR | Status: AC
  Filled 2015-07-24: qty 2

## 2015-07-24 MED ORDER — ROCURONIUM BROMIDE 50 MG/5ML IV SOLN
INTRAVENOUS | Status: AC
  Filled 2015-07-24: qty 1

## 2015-07-24 MED ORDER — SUCCINYLCHOLINE CHLORIDE 20 MG/ML IJ SOLN
INTRAMUSCULAR | Status: DC | PRN
  Administered 2015-07-24: 160 mg via INTRAVENOUS

## 2015-07-24 MED ORDER — PHENOL 1.4 % MT LIQD: 1.0000 | OROMUCOSAL | Status: DC | PRN

## 2015-07-24 MED ORDER — HEMOSTATIC AGENTS (NO CHARGE) OPTIME
TOPICAL | Status: DC | PRN
  Administered 2015-07-24: 1 via TOPICAL

## 2015-07-24 MED ORDER — ACETAMINOPHEN 650 MG RE SUPP: 325.0000 mg | RECTAL | Status: DC | PRN

## 2015-07-24 MED ORDER — FENTANYL CITRATE (PF) 250 MCG/5ML IJ SOLN
INTRAMUSCULAR | Status: AC
  Filled 2015-07-24: qty 5

## 2015-07-24 MED ORDER — AMLODIPINE BESYLATE 5 MG PO TABS
5.0000 mg | ORAL_TABLET | Freq: Every day | ORAL | Status: DC
  Administered 2015-07-25 – 2015-07-28 (×4): 5 mg via ORAL
  Filled 2015-07-24 (×4): qty 1

## 2015-07-24 MED ORDER — HYDRALAZINE HCL 20 MG/ML IJ SOLN
5.0000 mg | INTRAMUSCULAR | Status: DC | PRN
Start: 2015-07-24 — End: 2015-07-28

## 2015-07-24 MED ORDER — BISACODYL 10 MG RE SUPP: 10.0000 mg | Freq: Every day | RECTAL | Status: DC | PRN

## 2015-07-24 MED ORDER — IOPAMIDOL (ISOVUE-370) INJECTION 76%
INTRAVENOUS | Status: AC
  Administered 2015-07-24: 100 mL
  Filled 2015-07-24: qty 100

## 2015-07-24 MED ORDER — SODIUM CHLORIDE 0.9 % IV SOLN
INTRAVENOUS | Status: DC | PRN
  Administered 2015-07-24: 500 mL

## 2015-07-24 MED ORDER — 0.9 % SODIUM CHLORIDE (POUR BTL) OPTIME
TOPICAL | Status: DC | PRN
  Administered 2015-07-24: 2000 mL

## 2015-07-24 SURGICAL SUPPLY — 70 items
AGENT HMST SPONGE THK3/8 (HEMOSTASIS) ×1
BANDAGE ELASTIC 4 VELCRO ST LF (GAUZE/BANDAGES/DRESSINGS) IMPLANT
BANDAGE ESMARK 6X9 LF (GAUZE/BANDAGES/DRESSINGS) IMPLANT
BNDG CMPR 9X6 STRL LF SNTH (GAUZE/BANDAGES/DRESSINGS) ×1
BNDG ESMARK 6X9 LF (GAUZE/BANDAGES/DRESSINGS) ×3
CANISTER SUCTION 2500CC (MISCELLANEOUS) ×3 IMPLANT
CATH EMB 3FR 80CM (CATHETERS) ×3 IMPLANT
CATH EMB 4FR 80CM (CATHETERS) ×3 IMPLANT
CLIP TI MEDIUM 24 (CLIP) ×3 IMPLANT
CLIP TI WIDE RED SMALL 24 (CLIP) ×3 IMPLANT
COVER PROBE W GEL 5X96 (DRAPES) ×3 IMPLANT
CUFF TOURNIQUET SINGLE 24IN (TOURNIQUET CUFF) IMPLANT
CUFF TOURNIQUET SINGLE 34IN LL (TOURNIQUET CUFF) IMPLANT
CUFF TOURNIQUET SINGLE 44IN (TOURNIQUET CUFF) IMPLANT
DRAIN CHANNEL 15F RND FF W/TCR (WOUND CARE) IMPLANT
DRAPE C-ARM 42X72 X-RAY (DRAPES) ×3 IMPLANT
DRAPE PROXIMA HALF (DRAPES) IMPLANT
DRSG COVADERM 4X10 (GAUZE/BANDAGES/DRESSINGS) IMPLANT
DRSG COVADERM 4X6 (GAUZE/BANDAGES/DRESSINGS) ×3 IMPLANT
DRSG COVADERM 4X8 (GAUZE/BANDAGES/DRESSINGS) ×2 IMPLANT
DRSG TEGADERM 2-3/8X2-3/4 SM (GAUZE/BANDAGES/DRESSINGS) ×2 IMPLANT
ELECT REM PT RETURN 9FT ADLT (ELECTROSURGICAL) ×3
ELECTRODE REM PT RTRN 9FT ADLT (ELECTROSURGICAL) ×1 IMPLANT
EVACUATOR SILICONE 100CC (DRAIN) IMPLANT
GAUZE SPONGE 2X2 8PLY STRL LF (GAUZE/BANDAGES/DRESSINGS) ×1 IMPLANT
GLOVE BIO SURGEON STRL SZ7 (GLOVE) ×12 IMPLANT
GLOVE BIOGEL PI IND STRL 6.5 (GLOVE) IMPLANT
GLOVE BIOGEL PI IND STRL 7.5 (GLOVE) ×1 IMPLANT
GLOVE BIOGEL PI INDICATOR 6.5 (GLOVE) ×6
GLOVE BIOGEL PI INDICATOR 7.5 (GLOVE) ×12
GLOVE SURG SS PI 6.0 STRL IVOR (GLOVE) ×4 IMPLANT
GLOVE SURG SS PI 7.5 STRL IVOR (GLOVE) ×10 IMPLANT
GOWN STRL NON-REIN LRG LVL3 (GOWN DISPOSABLE) ×4 IMPLANT
GOWN STRL REUS W/ TWL LRG LVL3 (GOWN DISPOSABLE) ×3 IMPLANT
GOWN STRL REUS W/TWL LRG LVL3 (GOWN DISPOSABLE) ×9
HEMOSTAT SPONGE AVITENE ULTRA (HEMOSTASIS) ×3 IMPLANT
INSERT FOGARTY SM (MISCELLANEOUS) IMPLANT
KIT BASIN OR (CUSTOM PROCEDURE TRAY) ×3 IMPLANT
KIT ROOM TURNOVER OR (KITS) ×3 IMPLANT
MARKER GRAFT CORONARY BYPASS (MISCELLANEOUS) IMPLANT
NS IRRIG 1000ML POUR BTL (IV SOLUTION) ×6 IMPLANT
PACK PERIPHERAL VASCULAR (CUSTOM PROCEDURE TRAY) ×3 IMPLANT
PAD ARMBOARD 7.5X6 YLW CONV (MISCELLANEOUS) ×6 IMPLANT
PADDING CAST COTTON 6X4 STRL (CAST SUPPLIES) ×2 IMPLANT
PATCH VASC XENOSURE 1CMX6CM (Vascular Products) ×3 IMPLANT
PATCH VASC XENOSURE 1X6 (Vascular Products) IMPLANT
SET MICROPUNCTURE 5F STIFF (MISCELLANEOUS) ×2 IMPLANT
SPONGE GAUZE 2X2 STER 10/PKG (GAUZE/BANDAGES/DRESSINGS) ×2
STAPLER VISISTAT 35W (STAPLE) IMPLANT
STOPCOCK 4 WAY LG BORE MALE ST (IV SETS) ×2 IMPLANT
SUT ETHILON 3 0 PS 1 (SUTURE) IMPLANT
SUT GORETEX 5 0 TT13 24 (SUTURE) IMPLANT
SUT GORETEX 6.0 TT13 (SUTURE) IMPLANT
SUT MNCRL AB 4-0 PS2 18 (SUTURE) ×5 IMPLANT
SUT PROLENE 5 0 C 1 24 (SUTURE) ×3 IMPLANT
SUT PROLENE 6 0 BV (SUTURE) ×6 IMPLANT
SUT PROLENE 7 0 BV 1 (SUTURE) IMPLANT
SUT SILK 2 0 FS (SUTURE) IMPLANT
SUT SILK 3 0 (SUTURE)
SUT SILK 3-0 18XBRD TIE 12 (SUTURE) IMPLANT
SUT VIC AB 2-0 CT1 27 (SUTURE) ×3
SUT VIC AB 2-0 CT1 TAPERPNT 27 (SUTURE) ×2 IMPLANT
SUT VIC AB 3-0 SH 27 (SUTURE) ×3
SUT VIC AB 3-0 SH 27X BRD (SUTURE) ×3 IMPLANT
SYR 3ML LL SCALE MARK (SYRINGE) ×2 IMPLANT
SYRINGE 1CC SLIP TB (MISCELLANEOUS) ×2 IMPLANT
TRAY FOLEY W/METER SILVER 16FR (SET/KITS/TRAYS/PACK) ×3 IMPLANT
TUBING EXTENTION W/L.L. (IV SETS) ×2 IMPLANT
UNDERPAD 30X30 INCONTINENT (UNDERPADS AND DIAPERS) ×3 IMPLANT
WATER STERILE IRR 1000ML POUR (IV SOLUTION) ×3 IMPLANT

## 2015-07-24 NOTE — Consult Note (Addendum)
Referred by:  Dr. Wyvonnia Dusky [facility] ED)  Reason for referral: Right leg acute ischemia   History of Present Illness  Terry Munoz is a 65 y.o. (1950/06/06) male who presents with chief complaint: sudden onset of pain in right leg.  This patient previously underwent a R distal SFA/popliteal artery stent with Dr. Einar Gip in 05/01/14.  He presents today with acute onset of pain in right leg.  He notes numbness in the right foot with decreased movement.  On his previous angiogram, he had only AT runoff on the right side.  This patient's atherosclerotic risk factors include: HTN, DM, and prior smoking.  Past Medical History  Diagnosis Date  . Hypertension   . COPD (chronic obstructive pulmonary disease) ([place])   . Otitis media   . Sinusitis   . Insomnia   . Osteoarthritis   . Mouth ulcers   . Kidney stones   . Hyponatremia 03/2014  . Peripheral vascular disease ([place])   . 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 Dental surgery    . Hernia repair    . Lithotomy    . Neck surgery    . Thrombectomy  05/02/2014  . Popliteal artery angioplasty Right 05/02/2014    to mid sfa  . Lower extremity angiogram N/A 05/01/2014    Procedure: LOWER EXTREMITY ANGIOGRAM;  Surgeon: Laverda Page, MD;  Location: [facility] CATH LAB;  Service: Cardiovascular;  Laterality: N/A;    Social History   Social History  . Marital Status: Married    Spouse Name: N/A  . Number of Children: N/A  . Years of Education: N/A   Occupational History  . Not on file.   Social History Main Topics  . Smoking status: Former Smoker -- 2.00 packs/day for 45 years    Types: Cigarettes    Quit date: 04/27/2014  . Smokeless tobacco: Never Used  . Alcohol Use: 14.4 oz/week    24 Cans of beer per week  . Drug Use: No  . Sexual Activity: Not on file   Other Topics Concern  . Not on file   Social History Narrative    Family History:  patient is unable to detail the medical history of his parents  Current Facility-Administered Medications  Medication Dose Route Frequency Provider Last Rate Last Dose  . heparin ADULT infusion 100 units/mL (25000 units/250 mL)  1,200 Units/hr Intravenous Continuous Durwin Nora, RPH 12 mL/hr at 07/24/15 1431 1,200 Units/hr at 07/24/15 1431   Current Outpatient Prescriptions  Medication Sig Dispense Refill  . albuterol (PROVENTIL HFA;VENTOLIN HFA) 108 (90 Base) MCG/ACT inhaler Inhale 1-2 puffs into the lungs every 6 (six) hours as needed for wheezing or shortness of breath.    Marland Kitchen albuterol (PROVENTIL) (2.5 MG/3ML) 0.083% nebulizer solution Take 2.5 mg by nebulization every 6 (six) hours as needed for wheezing or shortness of breath.    Marland Kitchen amitriptyline (ELAVIL) 50 MG tablet Take 50 mg by mouth at bedtime.    Marland Kitchen amLODipine (NORVASC) 5 MG tablet Take 5 mg by mouth daily.    Marland Kitchen aspirin 325 MG tablet Take 325 mg by mouth daily.    Marland Kitchen atorvastatin (LIPITOR) 40 MG tablet Take 40 mg by mouth daily.    . budesonide-formoterol (SYMBICORT) 160-4.5 MCG/ACT inhaler Inhale 2 puffs into the lungs 2 (two) times daily.    . folic acid (FOLVITE) 1 MG tablet Take 1 tablet (  1 mg total) by mouth daily.    Marland Kitchen glimepiride (AMARYL) 4 MG tablet Take 4 mg by mouth 2 (two) times daily.    . insulin glargine (LANTUS) 100 UNIT/ML injection Inject 0.3 mLs (30 Units total) into the skin daily. (Patient taking differently: Inject 34 Units into the skin daily. ) 10 mL 11  . irbesartan (AVAPRO) 75 MG tablet Take 1 tablet (75 mg total) by mouth daily. 30 tablet 0  . JARDIANCE 10 MG TABS tablet Take 10 mg by mouth daily.    Marland Kitchen omeprazole (PRILOSEC) 20 MG capsule Take 20 mg by mouth daily as needed (heartburn).    Marland Kitchen oxyCODONE 10 MG TABS Take 1 tablet (10 mg total) by mouth every 4 (four) hours as needed for severe pain. 30 tablet 0  . potassium chloride (MICRO-K) 10 MEQ CR capsule Take 10 mEq by mouth daily.    Marland Kitchen thiamine 100 MG  tablet Take 1 tablet (100 mg total) by mouth daily.    . metoprolol tartrate (LOPRESSOR) 25 MG tablet Take 0.5 tablets (12.5 mg total) by mouth 2 (two) times daily. (Patient not taking: Reported on 07/24/2015) 60 tablet 0  . sucralfate (CARAFATE) 1 G tablet Take 1 tablet (1 g total) by mouth 4 (four) times daily -  with meals and at bedtime. 30 tablet 0     Allergies  Allergen Reactions  . Sulfa Antibiotics Hives     REVIEW OF SYSTEMS:  (Positives checked otherwise negative)  CARDIOVASCULAR:   [ ]  chest pain,  [ ]  chest pressure,  [ ]  palpitations,  [ ]  shortness of breath when laying flat,  [ ]  shortness of breath with exertion,   [x]  pain in feet when walking,  [x]  pain in feet when laying flat, [ ]  history of blood clot in veins (DVT),  [ ]  history of phlebitis,  [ ]  swelling in legs,  [ ]  varicose veins  PULMONARY:   [ ]  productive cough,  [ ]  asthma,  [ ]  wheezing  NEUROLOGIC:   [x]  weakness in arms or legs,  [x]  numbness in arms or legs,  [ ]  difficulty speaking or slurred speech,  [ ]  temporary loss of vision in one eye,  [ ]  dizziness  HEMATOLOGIC:   [ ]  bleeding problems,  [ ]  problems with blood clotting too easily  MUSCULOSKEL:   [ ]  joint pain, [ ]  joint swelling  GASTROINTEST:   [ ]  vomiting blood,  [ ]  blood in stool     GENITOURINARY:   [ ]  burning with urination,  [ ]  blood in urine  PSYCHIATRIC:   [ ]  history of major depression  INTEGUMENTARY:   [ ]  rashes,  [ ]  ulcers  CONSTITUTIONAL:   [ ]  fever,  [ ]  chills   For VQI Use Only  PRE-ADM LIVING: Home  AMB STATUS: Ambulatory  CAD Sx: None  PRIOR CHF: None  STRESS TEST: [x ] No, [ ]  Normal, [ ]  + ischemia, [ ]  + MI, [ ]  Both   Physical Examination  Filed Vitals:   07/24/15 1304 07/24/15 1330 07/24/15 1330 07/24/15 1415  BP: 183/91 193/88  183/95  Pulse: 95 103  101  Resp: 20 16  15   Height:   5\' 8"  (1.727 m)   Weight:   178 lb (80.74 kg)   SpO2: 100% 94%  94%    Body mass index is 27.07 kg/(m^2).  General: A&O x 3, WDWN  Head: [place]/AT  Ear/Nose/Throat: Hearing  grossly intact, nares w/o erythema or drainage, oropharynx w/o Erythema/Exudate, Mallampati score: 3  Eyes: PERRLA, EOMI  Neck: Supple, no nuchal rigidity, no palpable LAD  Pulmonary: Sym exp, good air movt, CTAB, no rales, rhonchi, & wheezing  Cardiac: RRR, Nl S1, S2, no Murmurs, rubs or gallops  Vascular: Vessel Right Left  Radial Palpable Palpable  Brachial Palpable Palpable  Carotid Palpable, without bruit Palpable, without bruit  Aorta Not palpable N/A  Femoral Palpable Palpable  Popliteal Not palpable Not palpable  PT Not Palpable Palpable  DP Not Palpable Not Palpable   Gastrointestinal: soft, NTND, no G/R, no HSM, no masses, no CVAT B  Musculoskeletal: M/S 5/5 throughout except R foot DF/PF 1/5, Extremities without ischemic changes except right foot which is pallorous  Neurologic: CN grossly intact ,Pain and light touch intact in extremities except right foot, Motor exam as listed above  Psychiatric: Judgment intact, Mood & affect appropriate for pt's clinical situation  Dermatologic: See M/S exam for extremity exam, no rashes otherwise noted  Lymph : No Cervical, Axillary, or Inguinal lymphadenopathy    Laboratory: CBC:    Component Value Date/Time   WBC 14.6* 04/23/2014 0543   RBC 3.38* 04/23/2014 0543   HGB 17.0 07/24/2015 1301   HCT 50.0 07/24/2015 1301   PLT 202 04/23/2014 0543   MCV 91.4 04/23/2014 0543   MCH 32.8 04/23/2014 0543   MCHC 35.9 04/23/2014 0543   RDW 12.3 04/23/2014 0543   LYMPHSABS 1.1 04/18/2014 1745   MONOABS 0.9 04/18/2014 1745   EOSABS 0.1 04/18/2014 1745   BASOSABS 0.0 04/18/2014 1745    BMP:    Component Value Date/Time   NA 140 07/24/2015 1301   K 3.6 07/24/2015 1301   CL 103 07/24/2015 1301   CO2 29 04/23/2014 0543   GLUCOSE 136* 07/24/2015 1301   BUN 16 07/24/2015 1301   CREATININE 1.10 07/24/2015 1301   CALCIUM  9.0 04/23/2014 0543   GFRNONAA 85* 04/23/2014 0543   GFRAA >90 04/23/2014 0543    Coagulation: Lab Results  Component Value Date   INR 0.87 04/18/2014   No results found for: PTT  Lipids:    Component Value Date/Time   CHOL 115 04/20/2014 0414   TRIG 57 04/20/2014 0414   HDL 53 04/20/2014 0414   CHOLHDL 2.2 04/20/2014 0414   VLDL 11 04/20/2014 0414   LDLCALC 51 04/20/2014 0414     Radiology: Ct Angio Ao+bifem W/cm &/or Wo/cm  07/24/2015  CLINICAL DATA:  65 year old male with a history of acute right leg pain. EXAM: CT ABDOMEN AND PELVIS WITH CONTRAST TECHNIQUE: Multidetector CT imaging of the abdomen and pelvis was performed using the standard protocol following bolus administration of intravenous contrast. CONTRAST:  100 cc Isovue 370 COMPARISON:  None. FINDINGS: Lower chest: Unremarkable appearance of the soft tissues of the chest wall. Heart size within normal limits.  No pericardial fluid/thickening. Unremarkable appearance of the distal esophagus. No hiatal hernia. Respiratory motion somewhat limits evaluation of the lungs. Calcified granuloma of the right lung base. No pleural effusion. Abdomen/pelvis: Nonvascular: Calcifications of the spleen, compatible with prior granulomatous disease. Unremarkable liver. Unremarkable bilateral adrenal glands. Punctate calcifications of pancreatic parenchyma, potentially representing prior pancreatitis. No associated inflammatory changes. Low-density lesion of the pancreatic tail measures 10 mm. No abnormally distended small bowel or colon. Normal appendix. 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 Unremarkable bilateral kidneys. Urinary bladder partially distended. Transverse diameter of the prostate measures 4 cm. Vascular: Distal thoracic aorta unremarkable. Mixed calcified  and soft plaque of the abdominal aorta without aneurysm or dissection flap. No periaortic fluid. Mesenteric vessels: Calcifications at the origin of the  celiac artery and superior mesenteric artery. There may be 50% narrowing at the origin of the SMA secondary to calcified plaque. Inferior mesenteric artery is patent. Tiny accessory left renal artery. Mild plaque at the origin of the main left renal artery, without significant stenosis or occlusion. Mixed plaque at the origin of the right renal artery, without significant stenosis identified or occlusion. Right lower extremity: Course caliber and contour of the right iliac system unremarkable with no aneurysm or dissection flap. Calcified plaque present in the iliofemoral system. Hypogastric artery is patent including anterior and posterior division. Mixed plaque of the common femoral artery. Profunda femoris patent. Origin of the superficial femoral artery is patent. Vascular stent of the mid segment of the right superficial femoral artery which extends to the popliteal artery. The SFA is occluded beginning at the proximal stent margin extending through the popliteal artery. Patency of the tibial vessels with reconstitution is limited, as the acquisition of the images may be before the arrival of the contrast bolus. Left lower extremity: Calcifications at the origin of the left iliac artery. Unremarkable course caliber and contour of the left iliac artery. Hypogastric artery appears occluded beyond the origin. No aneurysm or dissection of the left iliac artery. Mild disease of the common femoral artery which is patent. Profunda femoris is patent. Left superficial femoral artery demonstrates mild plaque in the canal though remains patent. Popliteal artery remains patent. Origin of the anterior tibial artery is patent as well as the tibioperoneal trunk. Calcifications are present along the length of the tibial vessels. At the ankle there appears to be maintain patency of the anterior tibial artery and posterior tibial artery. Difficult to assess patency of the peroneal artery. Musculoskeletal: Mild degenerative disc  disease, worst at the L5-S1 level. No displaced fracture. 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 The occlusion occurred is at the proximal margin of a stent system which extends to the above knee popliteal artery. Below this, it is difficult to assess patency of the popliteal artery and proximal tibial vessels, as the image acquisition may have occurred before arrival of the contrast bolus. Acuity cannot be assessed by CT, and correlation with presentation and noninvasive arterial testing is recommended. Low-density 10 mm lesion of the pancreas. While this is most likely benign, ACR recommendation is for a 12 month pancreas MRI pancreatic follow-up. These results were called by telephone at the time of interpretation on 07/24/2015 at 2:23 pm to Dr. Ezequiel Essex , who verbally acknowledged these results. Signed, Dulcy Fanny. Earleen Newport, DO [facility] Electronically Signed   By: Corrie Mckusick D.O.   On: 07/24/2015 14:25   Dg Chest Portable 1 View  07/24/2015  CLINICAL DATA:  65 year old male with history of hypoxia. EXAM: PORTABLE CHEST 1 VIEW COMPARISON:  Chest x-ray 04/22/2014. FINDINGS: Lung volumes are normal. No consolidative airspace disease. No pleural effusions. No pneumothorax. No pulmonary nodule or mass noted. Pulmonary vasculature and the cardiomediastinal silhouette are within normal limits. Atherosclerotic calcifications in the thoracic aorta. Orthopedic fixation hardware in the lower cervical spine. IMPRESSION: 1. No radiographic evidence of acute cardiopulmonary disease. 2. Atherosclerosis. Electronically Signed   By: Vinnie Langton M.D.   On: 07/24/2015 14:21    Medical Decision Making  Juanda Bond  Boateng is a 65 y.o. male who presents with: acute limb ischemia with occlusion of R femoropopliteal stents, known BLE PAD, COPD, multiple  atherosclerotic co-morbidities   Pt is already in the 6 hour ischemic window to salvage his right foot.  The patient is aware he is at high risk of losing his right leg.  Would emergently go to the OR for: right leg exploration, possible femoropopliteal or femorotibial bypass, and other indicated procedures. The risk, benefits, and alternative for bypass operations were discussed with the patient.   The patient is aware the risks include but are not limited to: bleeding, infection, myocardial infarction, stroke, limb loss, nerve damage, need for additional procedures in the future, wound complications, and inability to complete the revascularization.  We have briefly discussed the possibility of need for R AKA if the anterior tibial artery is chronically occluded as there would be no runoff to the right foot. At this point, I will NOT proceed with immediate R AKA if the revascularization cannot be completed. The patient is aware of these risks and agreed to proceed.  Thank you for allowing [place] to participate in this patient's care.   Adele Barthel, MD [facility] [place] Office: [phone] Pager: [phone]  07/24/2015, 2:35 PM  Addendum  After family discussion, the patient decided to proceed with R AKA if revascularization could not be completed.  Adele Barthel, MD [facility] [place] Office: [phone] Pager: [phone]  07/24/2015, 2:54 PM

## 2015-07-24 NOTE — ED Notes (Signed)
Dr. Bridgett Larsson at bedside, [facility] [id number] is ready for patient.

## 2015-07-24 NOTE — ED Provider Notes (Signed)
CSN: [id number]     Arrival date & time 07/24/15  1247 History   First MD Initiated Contact with Patient 07/24/15 1250     Chief Complaint  Patient presents with  . Leg Pain     (Consider location/radiation/quality/duration/timing/severity/associated sxs/prior Treatment) HPI Comments: Level 5 caveat for acuity of condition.  Patient present by EMS with acute onset of RLE pain that onset at 1030 am.  Pain is constant, sharp, aching.  No improvement in 12 mg morphine from EMS. Denies trauma.  Leg is pale and cool, unable to palpate pulse.  Hx SFA stent on same leg. No CP or SOB. Not on coumadin.  No fever.  The history is provided by the patient and the EMS personnel.    Past Medical History  Diagnosis Date  . Hypertension   . COPD (chronic obstructive pulmonary disease) ([place])   . Otitis media   . Sinusitis   . Insomnia   . Osteoarthritis   . Mouth ulcers   . Kidney stones   . Hyponatremia 03/2014  . Peripheral vascular disease ([place])   . Diabetes mellitus without complication ([place])     INSULIN DEPENDENT   Past Surgical History  Procedure Laterality Date  . Dental surgery    . Hernia repair    . Lithotomy    . Neck surgery    . Thrombectomy  05/02/2014  . Popliteal artery angioplasty Right 05/02/2014    to mid sfa  . Lower extremity angiogram N/A 05/01/2014    Procedure: LOWER EXTREMITY ANGIOGRAM;  Surgeon: Laverda Page, MD;  Location: [facility] CATH LAB;  Service: Cardiovascular;  Laterality: N/A;   History reviewed. No pertinent family history. Social History  Substance Use Topics  . Smoking status: Former Smoker -- 2.00 packs/day for 45 years    Types: Cigarettes    Quit date: 04/27/2014  . Smokeless tobacco: Never Used  . Alcohol Use: 14.4 oz/week    24 Cans of beer per week    Review of Systems  Unable to perform ROS: Acuity of condition  Genitourinary: Negative for dysuria and hematuria.  Musculoskeletal: Positive for myalgias and arthralgias.  A complete 10  system review of systems was obtained and all systems are negative except as noted in the HPI and PMH.      Allergies  Sulfa antibiotics  Home Medications   Prior to Admission medications   Medication Sig Start Date End Date Taking? Authorizing Provider  albuterol (PROVENTIL HFA;VENTOLIN HFA) 108 (90 Base) MCG/ACT inhaler Inhale 1-2 puffs into the lungs every 6 (six) hours as needed for wheezing or shortness of breath.   Yes Historical Provider, MD  albuterol (PROVENTIL) (2.5 MG/3ML) 0.083% nebulizer solution Take 2.5 mg by nebulization every 6 (six) hours as needed for wheezing or shortness of breath.   Yes Historical Provider, MD  amitriptyline (ELAVIL) 50 MG tablet Take 50 mg by mouth at bedtime.   Yes Historical Provider, MD  amLODipine (NORVASC) 5 MG tablet Take 5 mg by mouth daily.   Yes Historical Provider, MD  aspirin 325 MG tablet Take 325 mg by mouth daily.   Yes Historical Provider, MD  atorvastatin (LIPITOR) 40 MG tablet Take 40 mg by mouth daily.   Yes Historical Provider, MD  budesonide-formoterol (SYMBICORT) 160-4.5 MCG/ACT inhaler Inhale 2 puffs into the lungs 2 (two) times daily.   Yes Historical Provider, MD  folic acid (FOLVITE) 1 MG tablet Take 1 tablet (1 mg total) by mouth daily. 04/24/14  Yes Tomi Bamberger  Vann, DO  glimepiride (AMARYL) 4 MG tablet Take 4 mg by mouth 2 (two) times daily.   Yes Historical Provider, MD  insulin glargine (LANTUS) 100 UNIT/ML injection Inject 0.3 mLs (30 Units total) into the skin daily. Patient taking differently: Inject 34 Units into the skin daily.  04/24/14  Yes Jessica U Vann, DO  irbesartan (AVAPRO) 75 MG tablet Take 1 tablet (75 mg total) by mouth daily. 04/24/14  Yes Jessica U Vann, DO  JARDIANCE 10 MG TABS tablet Take 10 mg by mouth daily. 06/19/15  Yes Historical Provider, MD  omeprazole (PRILOSEC) 20 MG capsule Take 20 mg by mouth daily as needed (heartburn).   Yes Historical Provider, MD  oxyCODONE 10 MG TABS Take 1 tablet (10 mg  total) by mouth every 4 (four) hours as needed for severe pain. 04/24/14  Yes Geradine Girt, DO  potassium chloride (MICRO-K) 10 MEQ CR capsule Take 10 mEq by mouth daily.   Yes Historical Provider, MD  thiamine 100 MG tablet Take 1 tablet (100 mg total) by mouth daily. 04/24/14  Yes Geradine Girt, DO  metoprolol tartrate (LOPRESSOR) 25 MG tablet Take 0.5 tablets (12.5 mg total) by mouth 2 (two) times daily. Patient not taking: Reported on 07/24/2015 04/24/14   Geradine Girt, DO  sucralfate (CARAFATE) 1 G tablet Take 1 tablet (1 g total) by mouth 4 (four) times daily -  with meals and at bedtime. 02/22/13   Virgel Manifold, MD   BP 183/95 mmHg  Pulse 101  Resp 15  Ht 5\' 8"  (1.727 m)  Wt 178 lb (80.74 kg)  BMI 27.07 kg/m2  SpO2 94% Physical Exam  Constitutional: He is oriented to person, place, and time. He appears well-developed and well-nourished. No distress.  HENT:  Head: Normocephalic and atraumatic.  Mouth/Throat: Oropharynx is clear and moist. No oropharyngeal exudate.  Eyes: Conjunctivae and EOM are normal. Pupils are equal, round, and reactive to light.  Neck: Normal range of motion. Neck supple.  No meningismus.  Cardiovascular: Normal rate, regular rhythm, normal heart sounds and intact distal pulses.   No murmur heard. Pulmonary/Chest: Effort normal and breath sounds normal. No respiratory distress.  Abdominal: Soft. There is no tenderness. There is no rebound and no guarding.  Musculoskeletal: He exhibits edema and tenderness.  RLE is cold and pale.  Unable to palpate DP or PT pulse.  Femoral pulse intact.  ROM knee and ankle intact.   Compartment soft.  No pulses heard with doppler.  Neurological: He is alert and oriented to person, place, and time. No cranial nerve deficit. He exhibits normal muscle tone. Coordination normal.  No ataxia on finger to nose bilaterally. No pronator drift. 5/5 strength throughout. CN 2-12 intact.Equal grip strength. Sensation intact.   Skin:  Skin is warm.  Psychiatric: He has a normal mood and affect. His behavior is normal.  Nursing note and vitals reviewed.   ED Course  Procedures (including critical care time) Labs Review Labs Reviewed  COMPREHENSIVE METABOLIC PANEL - Abnormal; Notable for the following:    Glucose, Bld 131 (*)    GFR calc non Af Amer 59 (*)    All other components within normal limits  I-STAT CHEM 8, ED - Abnormal; Notable for the following:    Glucose, Bld 136 (*)    Calcium, Ion 1.09 (*)    All other components within normal limits  CBC WITH DIFFERENTIAL/PLATELET  PROTIME-INR  HEPARIN LEVEL (UNFRACTIONATED)  TYPE AND SCREEN  ABO/RH  Imaging Review Ct Angio Ao+bifem W/cm &/or Wo/cm  07/24/2015  CLINICAL DATA:  65 year old male with a history of acute right leg pain. EXAM: CT ABDOMEN AND PELVIS WITH CONTRAST TECHNIQUE: Multidetector CT imaging of the abdomen and pelvis was performed using the standard protocol following bolus administration of intravenous contrast. CONTRAST:  100 cc Isovue 370 COMPARISON:  None. FINDINGS: Lower chest: Unremarkable appearance of the soft tissues of the chest wall. Heart size within normal limits.  No pericardial fluid/thickening. Unremarkable appearance of the distal esophagus. No hiatal hernia. Respiratory motion somewhat limits evaluation of the lungs. Calcified granuloma of the right lung base. No pleural effusion. Abdomen/pelvis: Nonvascular: Calcifications of the spleen, compatible with prior granulomatous disease. Unremarkable liver. Unremarkable bilateral adrenal glands. Punctate calcifications of pancreatic parenchyma, potentially representing prior pancreatitis. No associated inflammatory changes. Low-density lesion of the pancreatic tail measures 10 mm. No abnormally distended small bowel or colon. Normal appendix. 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 Unremarkable bilateral kidneys. Urinary bladder partially distended. Transverse  diameter of the prostate measures 4 cm. Vascular: Distal thoracic aorta unremarkable. Mixed calcified and soft plaque of the abdominal aorta without aneurysm or dissection flap. No periaortic fluid. Mesenteric vessels: Calcifications at the origin of the celiac artery and superior mesenteric artery. There may be 50% narrowing at the origin of the SMA secondary to calcified plaque. Inferior mesenteric artery is patent. Tiny accessory left renal artery. Mild plaque at the origin of the main left renal artery, without significant stenosis or occlusion. Mixed plaque at the origin of the right renal artery, without significant stenosis identified or occlusion. Right lower extremity: Course caliber and contour of the right iliac system unremarkable with no aneurysm or dissection flap. Calcified plaque present in the iliofemoral system. Hypogastric artery is patent including anterior and posterior division. Mixed plaque of the common femoral artery. Profunda femoris patent. Origin of the superficial femoral artery is patent. Vascular stent of the mid segment of the right superficial femoral artery which extends to the popliteal artery. The SFA is occluded beginning at the proximal stent margin extending through the popliteal artery. Patency of the tibial vessels with reconstitution is limited, as the acquisition of the images may be before the arrival of the contrast bolus. Left lower extremity: Calcifications at the origin of the left iliac artery. Unremarkable course caliber and contour of the left iliac artery. Hypogastric artery appears occluded beyond the origin. No aneurysm or dissection of the left iliac artery. Mild disease of the common femoral artery which is patent. Profunda femoris is patent. Left superficial femoral artery demonstrates mild plaque in the canal though remains patent. Popliteal artery remains patent. Origin of the anterior tibial artery is patent as well as the tibioperoneal trunk.  Calcifications are present along the length of the tibial vessels. At the ankle there appears to be maintain patency of the anterior tibial artery and posterior tibial artery. Difficult to assess patency of the peroneal artery. Musculoskeletal: Mild degenerative disc disease, worst at the L5-S1 level. No displaced fracture. 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 The occlusion occurred is at the proximal margin of a stent system which extends to the above knee popliteal artery. Below this, it is difficult to assess patency of the popliteal artery and proximal tibial vessels, as the image acquisition may have occurred before arrival of the contrast bolus. Acuity cannot be assessed by CT, and correlation with presentation and noninvasive  arterial testing is recommended. Low-density 10 mm lesion of the pancreas. While this is most likely benign, ACR recommendation is for a 12 month pancreas MRI pancreatic follow-up. These results were called by telephone at the time of interpretation on 07/24/2015 at 2:23 pm to Dr. Ezequiel Essex , who verbally acknowledged these results. Signed, Dulcy Fanny. Earleen Newport, DO [facility] Radiology Electronically Signed   By: Corrie Mckusick D.O.   On: 07/24/2015 14:25   Dg Chest Portable 1 View  07/24/2015  CLINICAL DATA:  66 year old male with history of hypoxia. EXAM: PORTABLE CHEST 1 VIEW COMPARISON:  Chest x-ray 04/22/2014. FINDINGS: Lung volumes are normal. No consolidative airspace disease. No pleural effusions. No pneumothorax. No pulmonary nodule or mass noted. Pulmonary vasculature and the cardiomediastinal silhouette are within normal limits. Atherosclerotic calcifications in the thoracic aorta. Orthopedic fixation hardware in the lower cervical spine. IMPRESSION: 1. No radiographic evidence of acute cardiopulmonary disease. 2.  Atherosclerosis. Electronically Signed   By: Vinnie Langton M.D.   On: 07/24/2015 14:21   I have personally reviewed and evaluated these images and lab results as part of my medical decision-making.   EKG Interpretation   Date/Time:  Saturday July 24 2015 12:52:25 EDT Ventricular Rate:  97 PR Interval:  160 QRS Duration: 84 QT Interval:  371 QTC Calculation: 471 R Axis:   13 Text Interpretation:  Sinus rhythm Low voltage, extremity and precordial  leads Anteroseptal infarct, age indeterminate No significant change was  found Confirmed by Wyvonnia Dusky  MD, Trayce Caravello [phone]) on 07/24/2015 1:00:03 PM  Also confirmed by Wyvonnia Dusky  MD, [place] [phone]), editor Gilford Rile, CCT, [place]  ([id number])  on 07/24/2015 3:32:09 PM      MDM   Final diagnoses:  Limb ischemia  Patient with sudden and severe RLE pain.  No pulses on exam.  D/w Dr. Bridgett Larsson on arrival. Advises to start heparin gtt and obtain CTA with runoff.  Record review shows SFA stents placed in Feb 2016 by Dr. Einar Gip.   Pain difficult to control CTA shows SFA occlusion to popliteal.  Dr Bridgett Larsson has seen patient and plans to take to the OR for emergent bypass.  CRITICAL CARE Performed by: Ezequiel Essex Total critical care time: 40 minutes Critical care time was exclusive of separately billable procedures and treating other patients. Critical care was necessary to treat or prevent imminent or life-threatening deterioration. Critical care was time spent personally by me on the following activities: development of treatment plan with patient and/or surrogate as well as nursing, discussions with consultants, evaluation of patient's response to treatment, examination of patient, obtaining history from patient or surrogate, ordering and performing treatments and interventions, ordering and review of laboratory studies, ordering and review of radiographic studies, pulse oximetry and re-evaluation of patient's condition.     Ezequiel Essex,  MD 07/24/15 [phone]

## 2015-07-24 NOTE — Anesthesia Postprocedure Evaluation (Signed)
Anesthesia Post Note  Patient: Terry Munoz  Procedure(s) Performed: Procedure(s) (LRB): Thrombectomy Right Femoral-Popliteal Artery; Thrombectomy Right Anterior-Tibial Artery, Thrombectomy Right Tibial-Peroneal Trunk; Bovine Patch Angioplasty Right Popliteal Artery; Endarterectomy Right Anterior Tibial Artery and Right Tibial-Peroneal Trunk; Right Leg Angiogram, Four Compartment Fasciotomy Right Lower Leg (Right)  Patient location during evaluation: PACU Anesthesia Type: General Level of consciousness: awake and alert Pain management: pain level controlled Vital Signs Assessment: post-procedure vital signs reviewed and stable Respiratory status: spontaneous breathing, nonlabored ventilation, respiratory function stable and patient connected to nasal cannula oxygen Cardiovascular status: blood pressure returned to baseline and stable Postop Assessment: no signs of nausea or vomiting Anesthetic complications: no    Last Vitals:  Filed Vitals:   07/24/15 1924 07/24/15 1930  BP: 137/82 129/85  Pulse: 101 92  Temp:  35.9 C  Resp: 12 15    Last Pain:  Filed Vitals:   07/24/15 1931  PainSc: 5                  Otie Headlee DAVID

## 2015-07-24 NOTE — ED Notes (Signed)
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 RLE is pale, unable to palpate pulse. C/o of severe pain to RLE. Pt is a x 4, given 12 mg morphine. BP 180/91, CBG 132. Taking aspirin and plavix. 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

## 2015-07-24 NOTE — H&P (View-Only) (Signed)
Referred by:  Dr. Wyvonnia Dusky [facility] ED)  Reason for referral: Right leg acute ischemia   History of Present Illness  Terry Munoz is a 65 y.o. ([birth year]) male who presents with chief complaint: sudden onset of pain in right leg.  This patient previously underwent a R distal SFA/popliteal artery stent with Dr. Einar Gip in 05/01/14.  He presents today with acute onset of pain in right leg.  He notes numbness in the right foot with decreased movement.  On his previous angiogram, he had only AT runoff on the right side.  This patient's atherosclerotic risk factors include: HTN, DM, and prior smoking.  Past Medical History  Diagnosis Date  . Hypertension   . COPD (chronic obstructive pulmonary disease) ([place])   . Otitis media   . Sinusitis   . Insomnia   . Osteoarthritis   . Mouth ulcers   . Kidney stones   . Hyponatremia 03/2014  . Peripheral vascular disease ([place])   . Diabetes mellitus without complication ([place])     INSULIN DEPENDENT    Past Surgical History  Procedure Laterality Date  . Dental surgery    . Hernia repair    . Lithotomy    . Neck surgery    . Thrombectomy  05/02/2014  . Popliteal artery angioplasty Right 05/02/2014    to mid sfa  . Lower extremity angiogram N/A 05/01/2014    Procedure: LOWER EXTREMITY ANGIOGRAM;  Surgeon: Laverda Page, MD;  Location: [facility] CATH LAB;  Service: Cardiovascular;  Laterality: N/A;    Social History   Social History  . Marital Status: Married    Spouse Name: N/A  . Number of Children: N/A  . Years of Education: N/A   Occupational History  . Not on file.   Social History Main Topics  . Smoking status: Former Smoker -- 2.00 packs/day for 45 years    Types: Cigarettes    Quit date: 04/27/2014  . Smokeless tobacco: Never Used  . Alcohol Use: 14.4 oz/week    24 Cans of beer per week  . Drug Use: No  . Sexual Activity: Not on file   Other Topics Concern  . Not on file   Social History Narrative    Family History:  patient is unable to detail the medical history of his parents  Current Facility-Administered Medications  Medication Dose Route Frequency Provider Last Rate Last Dose  . heparin ADULT infusion 100 units/mL (25000 units/250 mL)  1,200 Units/hr Intravenous Continuous Durwin Nora, RPH 12 mL/hr at 07/24/15 1431 1,200 Units/hr at 07/24/15 1431   Current Outpatient Prescriptions  Medication Sig Dispense Refill  . albuterol (PROVENTIL HFA;VENTOLIN HFA) 108 (90 Base) MCG/ACT inhaler Inhale 1-2 puffs into the lungs every 6 (six) hours as needed for wheezing or shortness of breath.    Marland Kitchen albuterol (PROVENTIL) (2.5 MG/3ML) 0.083% nebulizer solution Take 2.5 mg by nebulization every 6 (six) hours as needed for wheezing or shortness of breath.    Marland Kitchen amitriptyline (ELAVIL) 50 MG tablet Take 50 mg by mouth at bedtime.    Marland Kitchen amLODipine (NORVASC) 5 MG tablet Take 5 mg by mouth daily.    Marland Kitchen aspirin 325 MG tablet Take 325 mg by mouth daily.    Marland Kitchen atorvastatin (LIPITOR) 40 MG tablet Take 40 mg by mouth daily.    . budesonide-formoterol (SYMBICORT) 160-4.5 MCG/ACT inhaler Inhale 2 puffs into the lungs 2 (two) times daily.    . folic acid (FOLVITE) 1 MG tablet Take 1 tablet (  1 mg total) by mouth daily.    Marland Kitchen glimepiride (AMARYL) 4 MG tablet Take 4 mg by mouth 2 (two) times daily.    . insulin glargine (LANTUS) 100 UNIT/ML injection Inject 0.3 mLs (30 Units total) into the skin daily. (Patient taking differently: Inject 34 Units into the skin daily. ) 10 mL 11  . irbesartan (AVAPRO) 75 MG tablet Take 1 tablet (75 mg total) by mouth daily. 30 tablet 0  . JARDIANCE 10 MG TABS tablet Take 10 mg by mouth daily.    Marland Kitchen omeprazole (PRILOSEC) 20 MG capsule Take 20 mg by mouth daily as needed (heartburn).    Marland Kitchen oxyCODONE 10 MG TABS Take 1 tablet (10 mg total) by mouth every 4 (four) hours as needed for severe pain. 30 tablet 0  . potassium chloride (MICRO-K) 10 MEQ CR capsule Take 10 mEq by mouth daily.    Marland Kitchen thiamine 100 MG  tablet Take 1 tablet (100 mg total) by mouth daily.    . metoprolol tartrate (LOPRESSOR) 25 MG tablet Take 0.5 tablets (12.5 mg total) by mouth 2 (two) times daily. (Patient not taking: Reported on 07/24/2015) 60 tablet 0  . sucralfate (CARAFATE) 1 G tablet Take 1 tablet (1 g total) by mouth 4 (four) times daily -  with meals and at bedtime. 30 tablet 0     Allergies  Allergen Reactions  . Sulfa Antibiotics Hives     REVIEW OF SYSTEMS:  (Positives checked otherwise negative)  CARDIOVASCULAR:   [ ]  chest pain,  [ ]  chest pressure,  [ ]  palpitations,  [ ]  shortness of breath when laying flat,  [ ]  shortness of breath with exertion,   [x]  pain in feet when walking,  [x]  pain in feet when laying flat, [ ]  history of blood clot in veins (DVT),  [ ]  history of phlebitis,  [ ]  swelling in legs,  [ ]  varicose veins  PULMONARY:   [ ]  productive cough,  [ ]  asthma,  [ ]  wheezing  NEUROLOGIC:   [x]  weakness in arms or legs,  [x]  numbness in arms or legs,  [ ]  difficulty speaking or slurred speech,  [ ]  temporary loss of vision in one eye,  [ ]  dizziness  HEMATOLOGIC:   [ ]  bleeding problems,  [ ]  problems with blood clotting too easily  MUSCULOSKEL:   [ ]  joint pain, [ ]  joint swelling  GASTROINTEST:   [ ]  vomiting blood,  [ ]  blood in stool     GENITOURINARY:   [ ]  burning with urination,  [ ]  blood in urine  PSYCHIATRIC:   [ ]  history of major depression  INTEGUMENTARY:   [ ]  rashes,  [ ]  ulcers  CONSTITUTIONAL:   [ ]  fever,  [ ]  chills   For VQI Use Only  PRE-ADM LIVING: Home  AMB STATUS: Ambulatory  CAD Sx: None  PRIOR CHF: None  STRESS TEST: [x ] No, [ ]  Normal, [ ]  + ischemia, [ ]  + MI, [ ]  Both   Physical Examination  Filed Vitals:   07/24/15 1304 07/24/15 1330 07/24/15 1330 07/24/15 1415  BP: 183/91 193/88  183/95  Pulse: 95 103  101  Resp: 20 16  15   Height:   5\' 8"  (1.727 m)   Weight:   178 lb (80.74 kg)   SpO2: 100% 94%  94%    Body mass index is 27.07 kg/(m^2).  General: A&O x 3, WDWN  Head: [place]/AT  Ear/Nose/Throat: Hearing  grossly intact, nares w/o erythema or drainage, oropharynx w/o Erythema/Exudate, Mallampati score: 3  Eyes: PERRLA, EOMI  Neck: Supple, no nuchal rigidity, no palpable LAD  Pulmonary: Sym exp, good air movt, CTAB, no rales, rhonchi, & wheezing  Cardiac: RRR, Nl S1, S2, no Murmurs, rubs or gallops  Vascular: Vessel Right Left  Radial Palpable Palpable  Brachial Palpable Palpable  Carotid Palpable, without bruit Palpable, without bruit  Aorta Not palpable N/A  Femoral Palpable Palpable  Popliteal Not palpable Not palpable  PT Not Palpable Palpable  DP Not Palpable Not Palpable   Gastrointestinal: soft, NTND, no G/R, no HSM, no masses, no CVAT B  Musculoskeletal: M/S 5/5 throughout except R foot DF/PF 1/5, Extremities without ischemic changes except right foot which is pallorous  Neurologic: CN grossly intact ,Pain and light touch intact in extremities except right foot, Motor exam as listed above  Psychiatric: Judgment intact, Mood & affect appropriate for pt's clinical situation  Dermatologic: See M/S exam for extremity exam, no rashes otherwise noted  Lymph : No Cervical, Axillary, or Inguinal lymphadenopathy    Laboratory: CBC:    Component Value Date/Time   WBC 14.6* 04/23/2014 0543   RBC 3.38* 04/23/2014 0543   HGB 17.0 07/24/2015 1301   HCT 50.0 07/24/2015 1301   PLT 202 04/23/2014 0543   MCV 91.4 04/23/2014 0543   MCH 32.8 04/23/2014 0543   MCHC 35.9 04/23/2014 0543   RDW 12.3 04/23/2014 0543   LYMPHSABS 1.1 04/18/2014 1745   MONOABS 0.9 04/18/2014 1745   EOSABS 0.1 04/18/2014 1745   BASOSABS 0.0 04/18/2014 1745    BMP:    Component Value Date/Time   NA 140 07/24/2015 1301   K 3.6 07/24/2015 1301   CL 103 07/24/2015 1301   CO2 29 04/23/2014 0543   GLUCOSE 136* 07/24/2015 1301   BUN 16 07/24/2015 1301   CREATININE 1.10 07/24/2015 1301   CALCIUM  9.0 04/23/2014 0543   GFRNONAA 85* 04/23/2014 0543   GFRAA >90 04/23/2014 0543    Coagulation: Lab Results  Component Value Date   INR 0.87 04/18/2014   No results found for: PTT  Lipids:    Component Value Date/Time   CHOL 115 04/20/2014 0414   TRIG 57 04/20/2014 0414   HDL 53 04/20/2014 0414   CHOLHDL 2.2 04/20/2014 0414   VLDL 11 04/20/2014 0414   LDLCALC 51 04/20/2014 0414     Radiology: Ct Angio Ao+bifem W/cm &/or Wo/cm  07/24/2015  CLINICAL DATA:  65 year old male with a history of acute right leg pain. EXAM: CT ABDOMEN AND PELVIS WITH CONTRAST TECHNIQUE: Multidetector CT imaging of the abdomen and pelvis was performed using the standard protocol following bolus administration of intravenous contrast. CONTRAST:  100 cc Isovue 370 COMPARISON:  None. FINDINGS: Lower chest: Unremarkable appearance of the soft tissues of the chest wall. Heart size within normal limits.  No pericardial fluid/thickening. Unremarkable appearance of the distal esophagus. No hiatal hernia. Respiratory motion somewhat limits evaluation of the lungs. Calcified granuloma of the right lung base. No pleural effusion. Abdomen/pelvis: Nonvascular: Calcifications of the spleen, compatible with prior granulomatous disease. Unremarkable liver. Unremarkable bilateral adrenal glands. Punctate calcifications of pancreatic parenchyma, potentially representing prior pancreatitis. No associated inflammatory changes. Low-density lesion of the pancreatic tail measures 10 mm. No abnormally distended small bowel or colon. Normal appendix. 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 Unremarkable bilateral kidneys. Urinary bladder partially distended. Transverse diameter of the prostate measures 4 cm. Vascular: Distal thoracic aorta unremarkable. Mixed calcified  and soft plaque of the abdominal aorta without aneurysm or dissection flap. No periaortic fluid. Mesenteric vessels: Calcifications at the origin of the  celiac artery and superior mesenteric artery. There may be 50% narrowing at the origin of the SMA secondary to calcified plaque. Inferior mesenteric artery is patent. Tiny accessory left renal artery. Mild plaque at the origin of the main left renal artery, without significant stenosis or occlusion. Mixed plaque at the origin of the right renal artery, without significant stenosis identified or occlusion. Right lower extremity: Course caliber and contour of the right iliac system unremarkable with no aneurysm or dissection flap. Calcified plaque present in the iliofemoral system. Hypogastric artery is patent including anterior and posterior division. Mixed plaque of the common femoral artery. Profunda femoris patent. Origin of the superficial femoral artery is patent. Vascular stent of the mid segment of the right superficial femoral artery which extends to the popliteal artery. The SFA is occluded beginning at the proximal stent margin extending through the popliteal artery. Patency of the tibial vessels with reconstitution is limited, as the acquisition of the images may be before the arrival of the contrast bolus. Left lower extremity: Calcifications at the origin of the left iliac artery. Unremarkable course caliber and contour of the left iliac artery. Hypogastric artery appears occluded beyond the origin. No aneurysm or dissection of the left iliac artery. Mild disease of the common femoral artery which is patent. Profunda femoris is patent. Left superficial femoral artery demonstrates mild plaque in the canal though remains patent. Popliteal artery remains patent. Origin of the anterior tibial artery is patent as well as the tibioperoneal trunk. Calcifications are present along the length of the tibial vessels. At the ankle there appears to be maintain patency of the anterior tibial artery and posterior tibial artery. Difficult to assess patency of the peroneal artery. Musculoskeletal: Mild degenerative disc  disease, worst at the L5-S1 level. No displaced fracture. 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 The occlusion occurred is at the proximal margin of a stent system which extends to the above knee popliteal artery. Below this, it is difficult to assess patency of the popliteal artery and proximal tibial vessels, as the image acquisition may have occurred before arrival of the contrast bolus. Acuity cannot be assessed by CT, and correlation with presentation and noninvasive arterial testing is recommended. Low-density 10 mm lesion of the pancreas. While this is most likely benign, ACR recommendation is for a 12 month pancreas MRI pancreatic follow-up. These results were called by telephone at the time of interpretation on 07/24/2015 at 2:23 pm to Dr. Ezequiel Essex , who verbally acknowledged these results. Signed, Dulcy Fanny. Earleen Newport, DO [facility] Radiology Electronically Signed   By: Corrie Mckusick D.O.   On: 07/24/2015 14:25   Dg Chest Portable 1 View  07/24/2015  CLINICAL DATA:  65 year old male with history of hypoxia. EXAM: PORTABLE CHEST 1 VIEW COMPARISON:  Chest x-ray 04/22/2014. FINDINGS: Lung volumes are normal. No consolidative airspace disease. No pleural effusions. No pneumothorax. No pulmonary nodule or mass noted. Pulmonary vasculature and the cardiomediastinal silhouette are within normal limits. Atherosclerotic calcifications in the thoracic aorta. Orthopedic fixation hardware in the lower cervical spine. IMPRESSION: 1. No radiographic evidence of acute cardiopulmonary disease. 2. Atherosclerosis. Electronically Signed   By: Vinnie Langton M.D.   On: 07/24/2015 14:21    Medical Decision Making  Juanda Bond  Schack is a 65 y.o. male who presents with: acute limb ischemia with occlusion of R femoropopliteal stents, known BLE PAD, COPD, multiple  atherosclerotic co-morbidities   Pt is already in the 6 hour ischemic window to salvage his right foot.  The patient is aware he is at high risk of losing his right leg.  Would emergently go to the OR for: right leg exploration, possible femoropopliteal or femorotibial bypass, and other indicated procedures. The risk, benefits, and alternative for bypass operations were discussed with the patient.   The patient is aware the risks include but are not limited to: bleeding, infection, myocardial infarction, stroke, limb loss, nerve damage, need for additional procedures in the future, wound complications, and inability to complete the revascularization.  We have briefly discussed the possibility of need for R AKA if the anterior tibial artery is chronically occluded as there would be no runoff to the right foot. At this point, I will NOT proceed with immediate R AKA if the revascularization cannot be completed. The patient is aware of these risks and agreed to proceed.  Thank you for allowing [place] to participate in this patient's care.   Adele Barthel, MD [facility] [place] Office: [phone] Pager: [phone]  07/24/2015, 2:35 PM  Addendum  After family discussion, the patient decided to proceed with R AKA if revascularization could not be completed.  Adele Barthel, MD [facility] [place] Office: [phone] Pager: [phone]  07/24/2015, 2:54 PM

## 2015-07-24 NOTE — Transfer of Care (Signed)
Immediate Anesthesia Transfer of Care Note  Patient: Terry Munoz  Procedure(s) Performed: Procedure(s): Thrombectomy Right Femoral-Popliteal Artery; Thrombectomy Right Anterior-Tibial Artery, Thrombectomy Right Tibial-Peroneal Trunk; Bovine Patch Angioplasty Right Popliteal Artery; Endarterectomy Right Anterior Tibial Artery and Right Tibial-Peroneal Trunk; Right Leg Angiogram, Four Compartment Fasciotomy Right Lower Leg (Right)  Patient Location: PACU  Anesthesia Type:General  Level of Consciousness: awake and alert   Airway & Oxygen Therapy: Patient Spontanous Breathing and Patient connected to face mask oxygen  Post-op Assessment: Report given to RN and Post -op Vital signs reviewed and stable  Post vital signs: Reviewed and stable  Last Vitals:  Filed Vitals:   07/24/15 1330 07/24/15 1415  BP: 193/88 183/95  Pulse: 103 101  Resp: 16 15    Last Pain:  Filed Vitals:   07/24/15 1426  PainSc: 9          Complications: No apparent anesthesia complications

## 2015-07-24 NOTE — Interval H&P Note (Signed)
[facility] [place]  History and Physical Update  The patient was interviewed and re-examined.  The patient's previous History and Physical has been reviewed and is unchanged from my consult.  There is no change in the plan of care: right leg exploration, possible femoropopliteal or femorotibial bypass, and other indicated procedures including right AKA.  Adele Barthel, MD [facility] [place] Office: [phone] Pager: [phone]  07/24/2015, 2:55 PM

## 2015-07-24 NOTE — ED Notes (Signed)
Pt asking for more pain medication, MD made aware. Will come talk with patient. 95% 2L O2.

## 2015-07-24 NOTE — Progress Notes (Signed)
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 Sulfa Antibiotics Hives    Patient Measurements: Height: 5\' 8"  (172.7 cm) (per patient) Weight: 178 lb (80.74 kg) (per patient) IBW/kg (Calculated) : 68.4  Vital Signs: Temp: 97.8 F (36.6 C) (04/29 1958) Temp Source: Oral (04/29 1958) BP: 129/85 mmHg (04/29 1930) Pulse Rate: 92 (04/29 1930)  Labs:  Recent Labs  07/24/15 1301 07/24/15 1425 07/24/15 1701 07/24/15 2017  HGB 17.0 15.5 14.3 14.7  HCT 50.0 46.6 42.0 44.4  PLT  --  186  --  176  LABPROT  --  13.4  --   --   INR  --  1.00  --   --   CREATININE 1.10 1.23  --  1.24    Estimated Creatinine Clearance: 56.7 mL/min (by C-G formula based on Cr of 1.24).   Assessment: 65 y/o M s/p multiple vascular procedures this evening to re-start heparin drip  Goal of Therapy:  Heparin level 0.3-0.7 units/ml Monitor platelets by anticoagulation protocol: Yes   Plan:  -No bolus post-op -Re-start heparin at 1100 units/hr  -0800 HL -Daily CBC/HL -Monitor for bleeding post-operatively   Narda Bonds 07/24/2015,11:50 PM

## 2015-07-24 NOTE — Anesthesia Procedure Notes (Signed)
Procedure Name: Intubation Date/Time: 07/24/2015 3:42 PM Performed by: Ollen Bowl Pre-anesthesia Checklist: Patient identified, Emergency Drugs available, Suction available, Patient being monitored and Timeout performed Patient Re-evaluated:Patient Re-evaluated prior to inductionOxygen Delivery Method: Circle system utilized and Simple face mask Preoxygenation: Pre-oxygenation with 100% oxygen Intubation Type: IV induction, Rapid sequence and Cricoid Pressure applied Ventilation: Mask ventilation without difficulty Laryngoscope Size: Miller and 3 Grade View: Grade I Tube type: Oral Tube size: 7.5 mm Number of attempts: 1 Airway Equipment and Method: Stylet and Patient positioned with wedge pillow Placement Confirmation: ETT inserted through vocal cords under direct vision,  positive ETCO2 and breath sounds checked- equal and bilateral Secured at: 22 cm Tube secured with: Tape Dental Injury: Teeth and Oropharynx as per pre-operative assessment

## 2015-07-24 NOTE — Anesthesia Preprocedure Evaluation (Signed)
Anesthesia Evaluation  Patient identified by MRN, date of birth, ID band Patient awake    Reviewed: Allergy & Precautions, NPO status , Patient's Chart, lab work & pertinent test results  Airway Mallampati: I  TM Distance: >3 FB Neck ROM: Full    Dental   Pulmonary COPD, former smoker,    Pulmonary exam normal        Cardiovascular hypertension, Pt. on medications + Peripheral Vascular Disease  Normal cardiovascular exam     Neuro/Psych    GI/Hepatic   Endo/Other  diabetes, Type 2, Insulin Dependent  Renal/GU      Musculoskeletal   Abdominal   Peds  Hematology   Anesthesia Other Findings   Reproductive/Obstetrics                             Anesthesia Physical Anesthesia Plan  ASA: III  Anesthesia Plan: General   Post-op Pain Management:    Induction: Intravenous  Airway Management Planned: Oral ETT  Additional Equipment:   Intra-op Plan:   Post-operative Plan: Extubation in OR  Informed Consent: I have reviewed the patients History and Physical, chart, labs and discussed the procedure including the risks, benefits and alternatives for the proposed anesthesia with the patient or authorized representative who has indicated his/her understanding and acceptance.     Plan Discussed with: CRNA and Surgeon  Anesthesia Plan Comments:         Anesthesia Quick Evaluation

## 2015-07-24 NOTE — Progress Notes (Signed)
ANTICOAGULATION CONSULT NOTE - Initial Consult  Pharmacy Consult for Heparin Indication: limb ischemia  Allergies  Allergen Reactions  . Sulfa Antibiotics Hives   Patient Measurements: Height: 5\' 8"  (172.7 cm) (per patient) Weight: 178 lb (80.74 kg) (per patient) IBW/kg (Calculated) : 68.4 Heparin Dosing Weight: 80.9  Vital Signs: BP: 183/91 mmHg (04/29 1304) Pulse Rate: 95 (04/29 1304)  Labs:  Recent Labs  07/24/15 1301  HGB 17.0  HCT 50.0  CREATININE 1.10   Estimated Creatinine Clearance: 63.9 mL/min (by C-G formula based on Cr of 1.1).  Medical History: Past Medical History  Diagnosis Date  . Hypertension   . COPD (chronic obstructive pulmonary disease) ([place])   . Otitis media   . Sinusitis   . Insomnia   . Osteoarthritis   . Mouth ulcers   . Kidney stones   . Hyponatremia 03/2014  . Peripheral vascular disease ([place])   . Diabetes mellitus without complication ([place])     INSULIN DEPENDENT   Assessment: 65yo male admitted with complaints of extreme pain in his lower leg.  He is on aspirin and plavix (stopped 2 weeks ago) at home.  He has a history of severe peripheral artery disease and chronic non-limb threatening ischemia.   The concern today is that this has now become limb threatening and his provider has asked [place] to start IV heparin for him.  I discussed this with him and his wife and they both confirm that his Plavix was stopped two weeks prior.  His only other "blood-thinner" medication is aspirin and he takes 325mg  daily.  His H/H are stable and no noted bleeding complications.  He has a normal renal function with a creatinine of 1.1.  His only stated allergy is to sulfa antibiotics.  Goal of Therapy:  Heparin level 0.3-0.7 units/ml Monitor platelets by anticoagulation protocol: Yes   Plan:  Begin IV heparin with 4000 unit bolus Start IV infusion at 1200 units/hr Obtain a heparin level 6 hours after starting to ensure therapeutic goals Daily CBC,  HL  Rober Minion, PharmD., MS Clinical Pharmacist Pager:  [phone] Thank you for allowing pharmacy to be part of this patients care team. 07/24/2015,1:44 PM

## 2015-07-24 NOTE — OR Nursing (Signed)
Sheath from Right Femoral Angiogram removed by Dr. Bridgett Larsson at [facility].  Pressure held for 15 minutes.  Site soft without hematoma.

## 2015-07-24 NOTE — Op Note (Signed)
OPERATIVE NOTE   PROCEDURE: 1. Thrombectomy right femoropopliteal artery 2. Thrombectomy right anterior tibial artery  3. Thrombectomy right tibioperoneal trunk  4. Endarterectomy right anterior tibial artery  5. Endarterectomy right tibioperoneal trunk  6. Bovine patch angioplasty right popliteal artery 7. Right calf four-compartment fasciotomies 8. Right common femoral artery cannulation under ultrasound guidance 9. Right leg runoff  PRE-OPERATIVE DIAGNOSIS: Occluded right SFA stents with possible occlusion of distal runoff  POST-OPERATIVE DIAGNOSIS: same as above   SURGEON: Adele Barthel, MD  ASSISTANT: RNFA  ANESTHESIA: general  ESTIMATED BLOOD LOSS: 200 cc  FINDING(S): 1.  Acute thrombus in SFA stents, popliteal artery, anterior tibial artery and tibioperoneal trunk  2.  Calcified and stenotic anterior tibial artery orifice 3.  Occluded distal tibioperoneal trunk  4.  Intact right anterior tibial artery runoff to foot 5.  Faint filling of right foot on runoff  SPECIMEN(S):  none  INDICATIONS:   Terry Munoz is a 65 y.o. male who presents with acute limb occlusion.  Patient has a history of right SFA stents extending into the at-the-knee popliteal artery.  He presents with acute occlusion of those stents and remaining run off, anterior tibial artery.  I recommended: right leg exploration, possible femoropopliteal or femorotibial bypass, and other indicated procedures.  The risk, benefits, and alternative for bypass operations were discussed with the patient.  The patient is aware the risks include but are not limited to: bleeding, infection, myocardial infarction, stroke, limb loss, nerve damage, need for additional procedures in the future, wound complications, and inability to complete the bypass. The patient is aware of these risks and agreed to proceed.  DESCRIPTION: After obtaining full informed written consent, the patient was brought back to the operating room  and placed supine upon the operating table.  The patient received IV antibiotics prior to induction.  After obtaining adequate anesthesia, the patient was prepped and draped in the standard fashion for: right leg bypass.  I turned my attention to the right calf.  I made an incision one finger-width posterior to the tibia.  I dissected through the subcutaneous tissue down to the fascia.  I opened the fascia with electrocautery and dissected the medial belly of the gastrocnemius posteriorly.  I dissected out the medial popliteal vein and retracted it posteriorly.  I dissected out the popliteal artery in its mid-segment and then carried the dissection distally.  I had to take down the proximal segment of the soleus muscle with electrocautery to find the bifurcation of the popliteal artery into the tibioperoneal trunk and anterior tibial artery.  I placed vessel loops around the two tibial arteries and the popliteal artery.  The patient was given 8000 units of Heparin intravenously, which was a therapeutic bolus. An additional 2000 units of Heparin was administered every hour after initial bolus to maintain anticoagulation.  In total, 10000 units of Heparin was administrated to achieve and maintain a therapeutic level of anticoagulation.    After waiting 3 minutes, I made an arteriotomy in the popliteal artery.  There was obvious acute thrombus in the lumen.  I extended the arteriotomy distally stop just proximal to the bifurcation in the tibioperoneal trunk and anterior tibial artery.  I passed a 3 Fogarty 50 cm distally down the anterior tibial artery which corresponded to being on the dorsum of the foot.  I extracted acute thrombus on 3 passes.  On the remaining 2 passes, there was no further thrombus.  I placed the anterior tibial artery under  tension.  I repeated this process with the tibioperoneal trunk.  The 3 Fogarty extracted acute thrombus on 2 passes, but only passed 10 cm down the tibioperoneal trunk.  I  placed this artery under tension.  I then passed the 3 Fogarty proximally extracting acute thrombus after passing 70 cm proximally.  I repeated this process 4 times proximally, extracting acute thrombus.  I had return of pulsatile bleeding.  I placed the popliteal artery under tension.   I obtained a bovine pericardial patch.  This was soaked for 3 minutes in normal saline.  I then fashioned a patch for the geometry of the arteriotomy.  I sewed this patch in place with a running stitch of 6-0.  Unfortunately, when I came to the orifice of the anterior tibial artery, I could not put stitches into this calcified orifice.  I removed the stitches in the bovine patch for 5 mm on each side to get better exposure of the anterior tibial artery orifice.  Using a Penfield, I did an endarterectomy, removing a severely calcified plaque which extended into the tibioperoneal trunk orifice also.  I then removed loose intimal flaps from the proximal anterior tibial artery for ~2 cm into the artery.  I extracted some intima flaps from the proximal tibioperoneal trunk.  This endarterectomy allowed me to finish the patch angioplasty of the distal popliteal down to the orifice of the tibioperoneal trunk and anterior tibial artery.  Prior to completing this patch angioplasty, I backbled all vessels, there no further obvious clot or disease.  I released all vessel loops and immediately there was a pulse in the proximal anterior tibial artery.  Due to the high technical failure rate of thrombectomies on stent superficial femoral arteries, I felt a completion angiogram was needed.  Under Sonosite guidance, I cannulated the right common femoral artery with a micropuncture needle.  I loaded a microwire into the common femoral artery and then exchanged the needle for the microsheath.  I connected the sheath to IV extension tubing.  Due to the limitations of the table, I could only perform a right leg runoff from the distal thigh down to  the ankle.  This runoff demonstrated patent distal stent with open below-the-knee popliteal artery with runoff primarily via the anterior tibial artery.  The anterior tibial artery terminates in the foot with faint filling of tarsal branches and collaterals.  At this point, the previously pallorous right foot had already become pink.  I felt no further intervention on the artery was necessary.  I gave the 50 mg of Protamine to start reversing anticoagulation.  I decide to perform four compartment fasciotomies on this right calf as >6 hours had elapsed with ischemia.  Both the deep and superficial posterior compartments had viable muscle with electrocautery.  The deep compartment had already been opened.  I opened the rest of the superficial compartment by sharply opening it with a Metzenbaum scissor along the fascia boundary down to the distal ankle.  No active bleeding occurred.  I then turned my attention to the lateral calf.  I made an incision 2/3 of the way between the tibia and fibula in the mid-portion of the lateral calf.  I dissected through the electrocautery until I had access to both the anterior and lateral compartments.  I open both with electrocautery and verified I was in the right plane.  Using the Metzenbaum scissor, I sharply open the lateral compartment proximal and then distally.  I repeated this with anterior compartment.  There was minimal bleeding distally.  I had my assistant hold pressure to the distal calf.   I turned my attention to the right femoral sheath.  I removed the sheath and held pressure proximally and distally for 15 minutes.  There was no active bleeding from the access site.  A pressure dressing was applied.  At this point, there was no further bleeding from the lateral fasciotomies.  I reapproximated the skin with staples.  I then turned my attention to the popliteal exposure.  I placed some Avitene into the exposure.  After a few minutes, there was no further  bleeding.  I removed the Avitene and washout the calf.  The subcutaneous tissue was reapproximated with a running stitch of 3-0 Vicryl.  The skin was then reapproximated with staples.  The calf was washed and dried both medially and laterally and sterile Coverall dressings were applied.    Distally, there was a strong anterior tibial artery signal at the end of the case   COMPLICATIONS: none  CONDITION: stable  Adele Barthel, MD [facility] [place] Office: [phone] Pager: [phone]  07/24/2015, 6:54 PM

## 2015-07-25 ENCOUNTER — Inpatient Hospital Stay (HOSPITAL_COMMUNITY): Payer: Medicare Other

## 2015-07-25 DIAGNOSIS — I743 Embolism and thrombosis of arteries of the lower extremities: Secondary | ICD-10-CM

## 2015-07-25 LAB — BASIC METABOLIC PANEL
ANION GAP: 9 (ref 5–15)
BUN: 11 mg/dL (ref 6–20)
CALCIUM: 8.3 mg/dL — AB (ref 8.9–10.3)
CO2: 26 mmol/L (ref 22–32)
CREATININE: 1.11 mg/dL (ref 0.61–1.24)
Chloride: 101 mmol/L (ref 101–111)
Glucose, Bld: 108 mg/dL — ABNORMAL HIGH (ref 65–99)
Potassium: 3.8 mmol/L (ref 3.5–5.1)
Sodium: 136 mmol/L (ref 135–145)

## 2015-07-25 LAB — CBC
HCT: 40.3 % (ref 39.0–52.0)
Hemoglobin: 13.7 g/dL (ref 13.0–17.0)
MCH: 33.4 pg (ref 26.0–34.0)
MCHC: 34 g/dL (ref 30.0–36.0)
MCV: 98.3 fL (ref 78.0–100.0)
PLATELETS: 173 10*3/uL (ref 150–400)
RBC: 4.1 MIL/uL — ABNORMAL LOW (ref 4.22–5.81)
RDW: 13.1 % (ref 11.5–15.5)
WBC: 14.8 10*3/uL — AB (ref 4.0–10.5)

## 2015-07-25 LAB — GLUCOSE, CAPILLARY
GLUCOSE-CAPILLARY: 82 mg/dL (ref 65–99)
GLUCOSE-CAPILLARY: 100 mg/dL — AB (ref 65–99)
GLUCOSE-CAPILLARY: 131 mg/dL — AB (ref 65–99)
GLUCOSE-CAPILLARY: 117 mg/dL — AB (ref 65–99)

## 2015-07-25 LAB — HEPARIN LEVEL (UNFRACTIONATED)
HEPARIN UNFRACTIONATED: 0.38 [IU]/mL (ref 0.30–0.70)
Heparin Unfractionated: 0.52 IU/mL (ref 0.30–0.70)

## 2015-07-25 NOTE — Evaluation (Signed)
Physical Therapy Evaluation Patient Details Name: Terry Munoz MRN: [id number] DOB: [birth year] Today's Date: 07/25/2015   History of Present Illness  Patient is a 65 yo male admitted 07/24/15 with RLE acute ischemia.  Patient s/p mult thrombectomies, angioplasty Rt pop artery, and 4 compartment fasciotomy on 07/24/15.    PMH:  Rt popliteal stent 04/2014, HTN, DM, tobacco use, COPD, OA, PVD    Clinical Impression  Patient presents with problems listed below.  Will benefit from acute PT to maximize functional mobility prior to discharge home with wife.  Recommend HHPT at d/c for continued therapy.    Follow Up Recommendations Home health PT;Supervision for mobility/OOB    Equipment Recommendations  None recommended by PT    Recommendations for Other Services       Precautions / Restrictions Precautions Precautions: Fall Restrictions Weight Bearing Restrictions: No      Mobility  Bed Mobility Overal bed mobility: Needs Assistance Bed Mobility: Supine to Sit;Sit to Supine     Supine to sit: Supervision Sit to supine: Supervision   General bed mobility comments: Patient able to move to sitting with assist for safety and lines only.  Transfers Overall transfer level: Needs assistance Equipment used: Rolling walker (2 wheeled) Transfers: Sit to/from Stand Sit to Stand: Min assist         General transfer comment: Verbal cues for hand placement.  Assist to steady during transfers.  Patient maintaining NWB - reports increase in pain with weightbearing.  Ambulation/Gait Ambulation/Gait assistance: Min assist Ambulation Distance (Feet): 10 Feet (5' forward and backward) Assistive device: Rolling walker (2 wheeled) Gait Pattern/deviations: Step-to pattern (Hop-to on LLE) Gait velocity: decreased Gait velocity interpretation: Below normal speed for age/gender General Gait Details: Verbal cues for safe use of RW.  Patient maintains NWB on RLE due to increased pain with  weight bearing.  Assist to steady during gait.  Stairs            Wheelchair Mobility    Modified Rankin (Stroke Patients Only)       Balance Overall balance assessment: Needs assistance         Standing balance support: Bilateral upper extremity supported Standing balance-Leahy Scale: Poor                               Pertinent Vitals/Pain Pain Assessment: 0-10 Pain Score: 2  Pain Location: RLE Pain Descriptors / Indicators: Sore;Tender Pain Intervention(s): Monitored during session;Repositioned    Home Living Family/patient expects to be discharged to:: Private residence Living Arrangements: Spouse/significant other Available Help at Discharge: Family;Available 24 hours/day Type of Home: House Home Access: Ramped entrance     Home Layout: One level Home Equipment: Walker - 2 wheels;Wheelchair - [employer];Shower seat      Prior Function Level of Independence: Independent         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, newspaper        Extremity/Trunk Assessment   Upper Extremity Assessment: Overall WFL for tasks assessed           Lower Extremity Assessment: RLE deficits/detail RLE Deficits / Details: Decreased strength and ROM due to pain/surgery    Cervical / Trunk Assessment: Normal  Communication   Communication: No difficulties  Cognition Arousal/Alertness: Awake/alert Behavior During Therapy: WFL for tasks assessed/performed Overall Cognitive Status: Within Functional Limits for tasks assessed  General Comments      Exercises        Assessment/Plan    PT Assessment Patient needs continued PT services  PT Diagnosis Difficulty walking;Acute pain   PT Problem List Decreased strength;Decreased range of motion;Decreased activity tolerance;Decreased balance;Decreased mobility;Decreased knowledge of use of DME;Pain  PT Treatment Interventions DME instruction;Gait  training;Functional mobility training;Therapeutic activities;Patient/family education   PT Goals (Current goals can be found in the Care Plan section) Acute Rehab PT Goals Patient Stated Goal: To be able to go home PT Goal Formulation: With patient Time For Goal Achievement: 08/01/15 Potential to Achieve Goals: Good    Frequency Min 3X/week   Barriers to discharge        Co-evaluation               End of Session Equipment Utilized During Treatment: Gait belt Activity Tolerance: Patient limited by pain;Patient limited by fatigue Patient left: in bed;with call bell/phone within reach Nurse Communication: Mobility status         Time: [id number] PT Time Calculation (min) (ACUTE ONLY): 10 min   Charges:   PT Evaluation $PT Eval Moderate Complexity: 1 Procedure     PT G CodesDespina Pole 18-Aug-2015, 2:49 PM Carita Pian. Sanjuana Kava, [place] Pager [phone]

## 2015-07-25 NOTE — Progress Notes (Signed)
   Daily Progress Note  Assessment/Planning: POD #1 s/p TE L leg, EA AT and TPT, BPA pop artery   Some decrease in signal reportedly overnight  Pt's sx are improved and foot appears perfused  Arterial duplex to evaluate right leg pending  Heparin drip started overnight ~6 hr postop  No evidence of compartment syndrome  Subjective  - 1 Day Post-Op  Sore leg, pain is better  Objective Filed Vitals:   07/25/15 0600 07/25/15 0700 07/25/15 0800 07/25/15 0807  BP:  109/67 105/69   Pulse:  95 92   Temp:    99.9 F (37.7 C)  TempSrc:    Oral  Resp: 18 18 13    Height:      Weight:      SpO2:  94% 97%     Intake/Output Summary (Last 24 hours) at 07/25/15 0855 Last data filed at 07/25/15 0700  Gross per 24 hour  Intake 3130.6 ml  Output   2850 ml  Net  280.6 ml    PULM  BLL rales CV  RRR GI  soft, NTND VASC  R foot appears viable, dopplerable AT (monophasic)   Laboratory CBC    Component Value Date/Time   WBC 14.8* 07/25/2015 0319   HGB 13.7 07/25/2015 0319   HCT 40.3 07/25/2015 0319   PLT 173 07/25/2015 0319    BMET    Component Value Date/Time   NA 136 07/25/2015 0319   K 3.8 07/25/2015 0319   CL 101 07/25/2015 0319   CO2 26 07/25/2015 0319   GLUCOSE 108* 07/25/2015 0319   BUN 11 07/25/2015 0319   CREATININE 1.11 07/25/2015 0319   CALCIUM 8.3* 07/25/2015 0319   GFRNONAA >60 07/25/2015 0319   GFRAA >60 07/25/2015 0319    Adele Barthel, MD [facility] [place]: [phone] Pager: [phone]  07/25/2015, 8:55 AM

## 2015-07-25 NOTE — Progress Notes (Addendum)
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 Sulfa Antibiotics Hives    Patient Measurements: Height: 5\' 8"  (172.7 cm) Weight: 163 lb 2.3 oz (74 kg) IBW/kg (Calculated) : 68.4  Vital Signs: Temp: 99.9 F (37.7 C) (04/30 0807) Temp Source: Oral (04/30 0807) BP: 115/71 mmHg (04/30 0900) Pulse Rate: 92 (04/30 0900)  Labs:  Recent Labs  07/24/15 1425 07/24/15 1701 07/24/15 2017 07/25/15 0319 07/25/15 0936  HGB 15.5 14.3 14.7 13.7  --   HCT 46.6 42.0 44.4 40.3  --   PLT 186  --  176 173  --   LABPROT 13.4  --   --   --   --   INR 1.00  --   --   --   --   HEPARINUNFRC  --   --   --   --  0.38  CREATININE 1.23  --  1.24 1.11  --     Estimated Creatinine Clearance: 63.3 mL/min (by C-G formula based on Cr of 1.11).   Assessment: 65 y/o M s/p multiple vascular procedures this evening to re-start heparin drip. Initial HL is therapeutic at 0.38 on heparin 1100 units/hr. No issues with infusion or bleeding noted.   Goal of Therapy:  Heparin level 0.3-0.7 units/ml Monitor platelets by anticoagulation protocol: Yes   Plan:  Continue heparin at 1100 units/hr  6h HL Daily CBC/HL Monitor for bleeding post-operatively   Andrey Cota. Diona Foley, PharmD, BCPS Clinical Pharmacist Pager [phone] 07/25/2015,10:57 AM  ADDN: Confirmatory HL remains therapeutic at 0.52 on heparin 1100 units/hr. No issues with infusion or bleeding noted. Continue current rate.  Andrey Cota. Diona Foley, PharmD, [place] Clinical Pharmacist Pager [phone]

## 2015-07-25 NOTE — Progress Notes (Signed)
Pt has arrived to 2w16. Telemetry box 2w07 has been applied to pt. CCMD has been notified. Pt has been oriented to room and is resting in bed with call light within reach. Will continue to monitor.   Grant Fontana RN, BSN

## 2015-07-25 NOTE — Progress Notes (Signed)
VASCULAR LAB PRELIMINARY  PRELIMINARY  PRELIMINARY  PRELIMINARY  Right lower extremity duplex completed.    Preliminary report:  Duplex scan of the right lower extremity post multiple levels of thrombectomy and occlusion of stents reveals no evidence of lower extremity arterial occlusion with patent stents.  Terry Munoz, [place], RVS 07/25/2015, 12:33 PM

## 2015-07-26 ENCOUNTER — Encounter (HOSPITAL_COMMUNITY): Payer: Self-pay | Admitting: Vascular Surgery

## 2015-07-26 LAB — GLUCOSE, CAPILLARY
Glucose-Capillary: 121 mg/dL — ABNORMAL HIGH (ref 65–99)
Glucose-Capillary: 105 mg/dL — ABNORMAL HIGH (ref 65–99)
Glucose-Capillary: 119 mg/dL — ABNORMAL HIGH (ref 65–99)
Glucose-Capillary: 178 mg/dL — ABNORMAL HIGH (ref 65–99)
Glucose-Capillary: 154 mg/dL — ABNORMAL HIGH (ref 65–99)

## 2015-07-26 LAB — CK
Total CK: 1896 U/L — ABNORMAL HIGH (ref 49–397)
Total CK: 1525 U/L — ABNORMAL HIGH (ref 49–397)

## 2015-07-26 LAB — HEMOGLOBIN A1C
Hgb A1c MFr Bld: 7.5 % — ABNORMAL HIGH (ref 4.8–5.6)
Mean Plasma Glucose: 169 mg/dL

## 2015-07-26 MED ORDER — ASPIRIN 81 MG PO CHEW
81.0000 mg | CHEWABLE_TABLET | Freq: Every day | ORAL | Status: DC
  Administered 2015-07-26 – 2015-07-28 (×3): 81 mg via ORAL
  Filled 2015-07-26 (×3): qty 1

## 2015-07-26 NOTE — Progress Notes (Signed)
Physical Therapy Treatment Patient Details Name: Terry Munoz MRN: [id number] DOB: April 06, 1950 Today's Date: 07/26/2015    History of Present Illness Patient is a 65 yo male admitted 07/24/15 with RLE acute ischemia.  Patient s/p mult thrombectomies, angioplasty Rt pop artery, and 4 compartment fasciotomy on 07/24/15.    PMH:  Rt popliteal stent 04/2014, HTN, DM, tobacco use, COPD, OA, PVD    PT Comments    Pt making good progress with mobility. As pain and swelling in RLE decr expect pt's mobility will continue to improve. Educated pt and son on importance of working to increase ROM and weight bearing in RLE. They verbalized understanding.  Follow Up Recommendations  Home health PT;Supervision for mobility/OOB     Equipment Recommendations  None recommended by PT    Recommendations for Other Services       Precautions / Restrictions Precautions Precautions: Fall Restrictions Weight Bearing Restrictions: No Other Position/Activity Restrictions: decreased tolerance of WB on R LE    Mobility  Bed Mobility Overal bed mobility: Modified Independent             General bed mobility comments: used rail, HOB up 20 degrees  Transfers Overall transfer level: Needs assistance Equipment used: Rolling walker (2 wheeled) Transfers: Sit to/from Stand Sit to Stand: Supervision         General transfer comment: supervision for safety. Verbal cues to slow descent to chair  Ambulation/Gait Ambulation/Gait assistance: Supervision;Min guard Ambulation Distance (Feet): 90 Feet Assistive device: Rolling walker (2 wheeled) Gait Pattern/deviations: Step-to pattern Gait velocity: decr Gait velocity interpretation: Below normal speed for age/gender General Gait Details: Pt basically NWB on RLE due to pain. Occasional TDWB. SpO2 after amb 91-92% on RA with HR 135 and dyspnea 3/4. As pt fatigues required min guard for safety.   Stairs            Wheelchair Mobility     Modified Rankin (Stroke Patients Only)       Balance Overall balance assessment: Needs assistance Sitting-balance support: No upper extremity supported;Feet supported Sitting balance-Leahy Scale: Normal     Standing balance support: Bilateral upper extremity supported Standing balance-Leahy Scale: Poor Standing balance comment: Support of walker due to standing with NWB on RLE due to pain                    Cognition Arousal/Alertness: Awake/alert Behavior During Therapy: WFL for tasks assessed/performed Overall Cognitive Status: Within Functional Limits for tasks assessed                      Exercises General Exercises - Lower Extremity Ankle Circles/Pumps: Right;AAROM;5 reps;Seated Other Exercises Other Exercises: Performed seated knee flexion on rt x 10 with encouragement to keep heel flat.    General Comments        Pertinent Vitals/Pain Pain Assessment: Faces Faces Pain Scale: Hurts little more Pain Location: RLE with weight bearing Pain Descriptors / Indicators: Sore Pain Intervention(s): Limited activity within patient's tolerance;Monitored during session    Home Living Family/patient expects to be discharged to:: Private residence Living Arrangements: Spouse/significant other Available Help at Discharge: Family;Available 24 hours/day Type of Home: House Home Access: Ramped entrance   Home Layout: One level Home Equipment: Walker - 2 wheels;Wheelchair - [employer];Shower seat      Prior Function Level of Independence: Independent      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   PT Goals (current goals can now be found in the  care plan section) Acute Rehab PT Goals Patient Stated Goal: To be able to go home Progress towards PT goals: Progressing toward goals    Frequency  Min 3X/week    PT Plan Current plan remains appropriate    Co-evaluation             End of Session   Activity Tolerance: Patient limited by  pain Patient left: in chair;with call bell/phone within reach;with family/visitor present     Time: [id number] PT Time Calculation (min) (ACUTE ONLY): 15 min  Charges:  $Gait Training: 8-22 mins                    G Codes:      Germain Koopmann 08/24/15, 12:48 PM [employer] PT [phone]

## 2015-07-26 NOTE — Progress Notes (Signed)
  [facility] Progress Note  Subjective  - POD #2  Right leg sore with some numbness of foot. Significantly improved from pre-op.   Objective Filed Vitals:   07/25/15 2015 07/26/15 0526  BP: 115/61 118/6  Pulse: 96 103  Temp: 98.7 F (37.1 C) 98.1 F (36.7 C)  Resp: 18 18  Tmax 99.9   Intake/Output Summary (Last 24 hours) at 07/26/15 0742 Last data filed at 07/25/15 1800  Gross per 24 hour  Intake    580 ml  Output   1100 ml  Net   -520 ml    Right calf incisions with slow oozing.  Right calf is swollen and mildly tender to palpation. Monophasic doppler flow right AT, peroneal and PT Motor and sensory intact right foot.   Assessment/Planning: 65 y.o. male is s/p: TE L leg, EA AT and TPT, BPA pop artery 2 Days Post-Op   Right leg and foot symptoms continue to improve. RLE duplex reveals patent stents with no evidence of lower extremity arterial occlusion.  Calf is swollen but do not believe he has a compartment syndrome.  On heparin gtt, will discuss possible anticoagulation with Dr. Bridgett Larsson. Continue mobilization.  Down to 2L of 02. Continue to wean as tolerated.   Terry Munoz 07/26/2015 7:42 AM --  Laboratory CBC    Component Value Date/Time   WBC 14.8* 07/25/2015 0319   HGB 13.7 07/25/2015 0319   HCT 40.3 07/25/2015 0319   PLT 173 07/25/2015 0319    BMET    Component Value Date/Time   NA 136 07/25/2015 0319   K 3.8 07/25/2015 0319   CL 101 07/25/2015 0319   CO2 26 07/25/2015 0319   GLUCOSE 108* 07/25/2015 0319   BUN 11 07/25/2015 0319   CREATININE 1.11 07/25/2015 0319   CALCIUM 8.3* 07/25/2015 0319   GFRNONAA >60 07/25/2015 0319   GFRAA >60 07/25/2015 0319    COAG Lab Results  Component Value Date   INR 1.00 07/24/2015   INR 0.87 04/18/2014   No results found for: PTT  Antibiotics Anti-infectives    Start     Dose/Rate Route Frequency Ordered Stop   07/24/15 2300  cefUROXime (ZINACEF) 1.5 g in dextrose 5 % 50 mL  IVPB     1.5 g 100 mL/hr over 30 Minutes Intravenous Every 12 hours 07/24/15 1954 07/25/15 1151   07/24/15 1532  dextrose 5 % with cefUROXime (ZINACEF) ADS Med    Comments:  Terry Munoz   : cabinet override      07/24/15 1532 07/24/15 [place], PA-C [facility] Office: [phone] Pager: [phone] 07/26/2015 7:42 AM   Addendum  I have independently interviewed and examined the patient, and I agree with the physician assistant's findings.  On exam, sensation intact in right foot with intact DF/PF.  I have already completed fasciotomies on this patient, so I suspect the swelling is bleeding.  There was no significant swelling yesterday, so I doubt compartment syndrome as the etiology.  - stop heparin drip - check CK - Home on Plavix tomorrow if no active bleeding overnight  Terry Barthel, MD [facility] [place] Office: [phone] Pager: [phone]  07/26/2015, 7:52 AM

## 2015-07-26 NOTE — Consult Note (Signed)
CARDIOLOGY CONSULT NOTE  Patient ID: Terry Munoz MRN: [id number] DOB/AGE: 66-Dec-1952 66 y.o.  Admit date: 07/24/2015 Referring Physician: Vascular Surgery Primary Physician:  Tula Nakayama Reason for Consultation: PAD  HPI: Terry Munoz  is a 65 y.o. male with  a history of COPD with a ~100 pack year history of smoking, hyperlipidemia, hypertension, and diabetes who presented for evaluation of dyspnea on exertion and right leg pain. He was scheduled for echocardiogram, stress test, and lower extremity duplex. LE duplex revealed right distal SFA occlusion with trickle flow below the knee and right ABI of 0.08, noted at the post tibial artery level. On 05/01/2014, he underwent stenting of the right proximal to mid SFA with implantation of 2 overlapping 6.0 x 150 and a 6 x 100 mm Viabahn covered stents.  He was last seen on an outpatient basis on 06/23/2015 with stable peripheral vascular exam and remained asymptomatic. He had lower extremity duplex on 06/17/2015 that revealed patent stents.  As it had been greater than 1 year since PV angioplasty and he had reported frequent bruising on DAPT, clopidogrel was discontinued and he was continued on 325mg  ASA.  He presented to the ED on 07/24/2015 with sudden onset right leg pain and discoloration. He was found to have acute thrombus in SFA stents, popliteal artery, anterior tibial artery and tibioperoneal trunk and underwent emergent thrombectomy of right femoropopliteal artery, anterior tibial artery, & tibioperoneal trunk, endarterectomy of right anterior tibial artery & right tibioperoneal trunk, bovine patch angioplasty right popliteal artery, and right calf fasciotomy.   Past Medical History  Diagnosis Date  . Hypertension   . COPD (chronic obstructive pulmonary disease) ([place])   . Otitis media   . Sinusitis   . Insomnia   . Osteoarthritis   . Mouth ulcers   . Kidney stones   . Hyponatremia 03/2014  . Peripheral vascular disease  ([place])   . Diabetes mellitus without complication ([place])     INSULIN DEPENDENT     Past Surgical History  Procedure Laterality Date  . Dental surgery    . Hernia repair    . Lithotomy    . Neck surgery    . Thrombectomy  05/02/2014  . Popliteal artery angioplasty Right 05/02/2014    to mid sfa  . Lower extremity angiogram N/A 05/01/2014    Procedure: LOWER EXTREMITY ANGIOGRAM;  Surgeon: Laverda Page, MD;  Location: [facility] CATH LAB;  Service: Cardiovascular;  Laterality: N/A;     History reviewed. No pertinent family history.   Social History: Social History   Social History  . Marital Status: Married    Spouse Name: N/A  . Number of Children: N/A  . Years of Education: N/A   Occupational History  . Not on file.   Social History Main Topics  . Smoking status: Former Smoker -- 2.00 packs/day for 45 years    Types: Cigarettes    Quit date: 04/27/2014  . Smokeless tobacco: Never Used  . Alcohol Use: 14.4 oz/week    24 Cans of beer per week  . Drug Use: No  . Sexual Activity: Not on file   Other Topics Concern  . Not on file   Social History Narrative     Prescriptions prior to admission  Medication Sig Dispense Refill Last Dose  . albuterol (PROVENTIL HFA;VENTOLIN HFA) 108 (90 Base) MCG/ACT inhaler Inhale 1-2 puffs into the lungs every 6 (six) hours as needed for wheezing or shortness of breath.   07/24/2015 at Unknown  time  . albuterol (PROVENTIL) (2.5 MG/3ML) 0.083% nebulizer solution Take 2.5 mg by nebulization every 6 (six) hours as needed for wheezing or shortness of breath.   Past Month at Unknown time  . amitriptyline (ELAVIL) 50 MG tablet Take 50 mg by mouth at bedtime.   07/23/2015 at Unknown time  . amLODipine (NORVASC) 5 MG tablet Take 5 mg by mouth daily.   07/23/2015 at Unknown time  . aspirin 325 MG tablet Take 325 mg by mouth daily.   07/24/2015 at 0830  . atorvastatin (LIPITOR) 40 MG tablet Take 40 mg by mouth daily.   07/23/2015 at Unknown time  .  budesonide-formoterol (SYMBICORT) 160-4.5 MCG/ACT inhaler Inhale 2 puffs into the lungs 2 (two) times daily.   07/24/2015 at Unknown time  . folic acid (FOLVITE) 1 MG tablet Take 1 tablet (1 mg total) by mouth daily.   07/24/2015 at Unknown time  . glimepiride (AMARYL) 4 MG tablet Take 4 mg by mouth 2 (two) times daily.   07/24/2015 at Unknown time  . insulin glargine (LANTUS) 100 UNIT/ML injection Inject 0.3 mLs (30 Units total) into the skin daily. (Patient taking differently: Inject 34 Units into the skin daily. ) 10 mL 11 07/24/2015 at Unknown time  . irbesartan (AVAPRO) 75 MG tablet Take 1 tablet (75 mg total) by mouth daily. 30 tablet 0 07/23/2015 at Unknown time  . JARDIANCE 10 MG TABS tablet Take 10 mg by mouth daily.   07/24/2015 at Unknown time  . omeprazole (PRILOSEC) 20 MG capsule Take 20 mg by mouth daily as needed (heartburn).   Past Week at Unknown time  . oxyCODONE 10 MG TABS Take 1 tablet (10 mg total) by mouth every 4 (four) hours as needed for severe pain. 30 tablet 0 Past Month at Unknown time  . potassium chloride (MICRO-K) 10 MEQ CR capsule Take 10 mEq by mouth daily.   07/24/2015 at Unknown time  . thiamine 100 MG tablet Take 1 tablet (100 mg total) by mouth daily.   07/24/2015 at Unknown time  . metoprolol tartrate (LOPRESSOR) 25 MG tablet Take 0.5 tablets (12.5 mg total) by mouth 2 (two) times daily. (Patient not taking: Reported on 07/24/2015) 60 tablet 0 Not Taking at Unknown time  . sucralfate (CARAFATE) 1 G tablet Take 1 tablet (1 g total) by mouth 4 (four) times daily -  with meals and at bedtime. 30 tablet 0 04/30/2014 at Unknown time     ROS: General: no fevers/chills/night sweats Eyes: no blurry vision, diplopia, or amaurosis ENT: no sore throat or hearing loss Resp: no cough, wheezing, or hemoptysis CV: no chest pain or palpitations GI: no abdominal pain, nausea, vomiting, diarrhea, or constipation GU: no dysuria, frequency, or hematuria Skin: no rash Neuro: no  headache, numbness, tingling, or weakness of extremities Musculoskeletal: no joint pain or swelling Heme: no bleeding, DVT, or easy bruising Endo: no polydipsia or polyuria    Physical Exam: Blood pressure 118/6, pulse 92, temperature 98.1 F (36.7 C), temperature source Oral, resp. rate 16, height 5\' 8"  (1.727 m), weight 74 kg (163 lb 2.3 oz), SpO2 91 %.   General appearance: alert, cooperative and no distress Neck: no adenopathy, no carotid bruit, no JVD, supple, symmetrical, trachea midline and thyroid not enlarged, symmetric, no tenderness/mass/nodules Lungs: clear to auscultation bilaterally Heart: regular rate and rhythm, S1, S2 normal, no murmur, click, rub or gallop Extremities: skin pink and warm, SCD on left leg, bandages on incision sites clean and intact on  right leg Pulses: Right LE pulses present by doppler, left DP unpalpable, otherwise normal Neurologic: Grossly normal  Labs:   Lab Results  Component Value Date   WBC 14.8* 07/25/2015   HGB 13.7 07/25/2015   HCT 40.3 07/25/2015   MCV 98.3 07/25/2015   PLT 173 07/25/2015    Recent Labs Lab 07/24/15 1425  07/25/15 0319  NA 140  < > 136  K 3.7  < > 3.8  CL 103  --  101  CO2 22  --  26  BUN 12  --  11  CREATININE 1.23  < > 1.11  CALCIUM 9.8  --  8.3*  PROT 7.5  --   --   BILITOT 0.8  --   --   ALKPHOS 69  --   --   ALT 31  --   --   AST 28  --   --   GLUCOSE 131*  < > 108*  < > = values in this interval not displayed.  Lipid Panel     Component Value Date/Time   CHOL 115 04/20/2014 0414   TRIG 57 04/20/2014 0414   HDL 53 04/20/2014 0414   CHOLHDL 2.2 04/20/2014 0414   VLDL 11 04/20/2014 0414   LDLCALC 51 04/20/2014 0414    BNP (last 3 results) No results for input(s): BNP in the last 8760 hours.  HEMOGLOBIN A1C Lab Results  Component Value Date   HGBA1C 9.4* 04/20/2014   MPG 223* 04/20/2014    Cardiac Panel (last 3 results) No results for input(s): CKTOTAL, CKMB, TROPONINI, RELINDX in  the last 8760 hours.  Lab Results  Component Value Date   TROPONINI <0.30 02/22/2013     TSH No results for input(s): TSH in the last 8760 hours.  EKG 07/24/2015: sinus rhythm at a rate of 97 bpm, leftward axis, IRBBB, PRWP, no ischemia  LE arterial duplex 07/25/2015: No evidence of arterial reocclusion of the right lower extremity. Stent appears to be patent thoughout.  Outpatient Lower extremity arterial duplex 06/15/2015: No hemodynamically significant stenosis noted in bilateral lower extremities. Right SFA stent is patent. This exam reveals mildly decreased perfusion of the right lower extremity with ABI of 0.94 and normal perfusion of the left lower extremity with ABI of 0.97.  Lipid Panel 05/24/2014: Total cholesterol 131, triglycerides 70, HDL 65, LDL 52, LDL particle # 502   Radiology: Ct Angio Ao+bifem W/cm &/or Wo/cm  07/24/2015  CLINICAL DATA:  64 year old male with a history of acute right leg pain. EXAM: CT ABDOMEN AND PELVIS WITH CONTRAST TECHNIQUE: Multidetector CT imaging of the abdomen and pelvis was performed using the standard protocol following bolus administration of intravenous contrast. CONTRAST:  100 cc Isovue 370 COMPARISON:  None. FINDINGS: Lower chest: Unremarkable appearance of the soft tissues of the chest wall. Heart size within normal limits.  No pericardial fluid/thickening. Unremarkable appearance of the distal esophagus. No hiatal hernia. Respiratory motion somewhat limits evaluation of the lungs. Calcified granuloma of the right lung base. No pleural effusion. Abdomen/pelvis: Nonvascular: Calcifications of the spleen, compatible with prior granulomatous disease. Unremarkable liver. Unremarkable bilateral adrenal glands. Punctate calcifications of pancreatic parenchyma, potentially representing prior pancreatitis. No associated inflammatory changes. Low-density lesion of the pancreatic tail measures 10 mm. No abnormally distended small bowel or colon. Normal  appendix. 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 Unremarkable bilateral kidneys. Urinary bladder partially distended. Transverse diameter of the prostate measures 4 cm. Vascular: Distal thoracic aorta unremarkable. Mixed calcified and soft plaque  of the abdominal aorta without aneurysm or dissection flap. No periaortic fluid. Mesenteric vessels: Calcifications at the origin of the celiac artery and superior mesenteric artery. There may be 50% narrowing at the origin of the SMA secondary to calcified plaque. Inferior mesenteric artery is patent. Tiny accessory left renal artery. Mild plaque at the origin of the main left renal artery, without significant stenosis or occlusion. Mixed plaque at the origin of the right renal artery, without significant stenosis identified or occlusion. Right lower extremity: Course caliber and contour of the right iliac system unremarkable with no aneurysm or dissection flap. Calcified plaque present in the iliofemoral system. Hypogastric artery is patent including anterior and posterior division. Mixed plaque of the common femoral artery. Profunda femoris patent. Origin of the superficial femoral artery is patent. Vascular stent of the mid segment of the right superficial femoral artery which extends to the popliteal artery. The SFA is occluded beginning at the proximal stent margin extending through the popliteal artery. Patency of the tibial vessels with reconstitution is limited, as the acquisition of the images may be before the arrival of the contrast bolus. Left lower extremity: Calcifications at the origin of the left iliac artery. Unremarkable course caliber and contour of the left iliac artery. Hypogastric artery appears occluded beyond the origin. No aneurysm or dissection of the left iliac artery. Mild disease of the common femoral artery which is patent. Profunda femoris is patent. Left superficial femoral artery demonstrates mild plaque in the canal  though remains patent. Popliteal artery remains patent. Origin of the anterior tibial artery is patent as well as the tibioperoneal trunk. Calcifications are present along the length of the tibial vessels. At the ankle there appears to be maintain patency of the anterior tibial artery and posterior tibial artery. Difficult to assess patency of the peroneal artery. Musculoskeletal: Mild degenerative disc disease, worst at the L5-S1 level. No displaced fracture. 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 The occlusion occurred is at the proximal margin of a stent system which extends to the above knee popliteal artery. Below this, it is difficult to assess patency of the popliteal artery and proximal tibial vessels, as the image acquisition may have occurred before arrival of the contrast bolus. Acuity cannot be assessed by CT, and correlation with presentation and noninvasive arterial testing is recommended. Low-density 10 mm lesion of the pancreas. While this is most likely benign, ACR recommendation is for a 12 month pancreas MRI pancreatic follow-up. These results were called by telephone at the time of interpretation on 07/24/2015 at 2:23 pm to Dr. Ezequiel Essex , who verbally acknowledged these results. Signed, Dulcy Fanny. Earleen Newport, DO [facility] Rad[facility] Electronically Signed   By: Corrie Mckusick D.O.   On: 07/24/2015 14:25   Dg Ang/ext/uni/or Right  07/24/2015  CLINICAL DATA:  65 year old male with acute right lower extremity arterial occlusion EXAM: DG C-ARM 61-120 MIN; RIGHT ANG/EXT/UNI/ OR CONTRAST:  Op note FLUOROSCOPY TIME:  Fluoroscopy Time (in minutes and seconds): 1 minutes 25 seconds COMPARISON:  CT angiogram runoff 07/24/2015 FINDINGS: Intraoperative angiogram of the right lower extremity demonstrating patency of distal superficial femoral artery  stent construct and the above knee popliteal artery stent construct. Patency of the below knee popliteal artery, origin of the anterior tibial artery, and origin of the tibioperoneal trunk. There appears to be in-line flow through the anterior tibial artery to the ankle. Distal  posterior tibial artery and peroneal artery occluded. IMPRESSION: Intraoperative angiogram demonstrates patent distal SFA and popliteal artery, with in-line flow through the anterior tibial artery to the ankle. Please refer to the dictated operative report for full details of intraoperative findings and procedure. Signed, Dulcy Fanny. Earleen Newport, DO [facility] Rad[facility] Electronically Signed   By: Corrie Mckusick D.O.   On: 07/24/2015 20:38   Dg Chest Portable 1 View  07/24/2015  CLINICAL DATA:  65 year old male with history of hypoxia. EXAM: PORTABLE CHEST 1 VIEW COMPARISON:  Chest x-ray 04/22/2014. FINDINGS: Lung volumes are normal. No consolidative airspace disease. No pleural effusions. No pneumothorax. No pulmonary nodule or mass noted. Pulmonary vasculature and the cardiomediastinal silhouette are within normal limits. Atherosclerotic calcifications in the thoracic aorta. Orthopedic fixation hardware in the lower cervical spine. IMPRESSION: 1. No radiographic evidence of acute cardiopulmonary disease. 2. Atherosclerosis. Electronically Signed   By: Vinnie Langton M.D.   On: 07/24/2015 14:21   Dg C-arm 1-60 Min  07/24/2015  CLINICAL DATA:  65 year old male with acute right lower extremity arterial occlusion EXAM: DG C-ARM 61-120 MIN; RIGHT ANG/EXT/UNI/ OR CONTRAST:  Op note FLUOROSCOPY TIME:  Fluoroscopy Time (in minutes and seconds): 1 minutes 25 seconds COMPARISON:  CT angiogram runoff 07/24/2015 FINDINGS: Intraoperative angiogram of the right lower extremity demonstrating patency of distal superficial femoral artery stent construct and the above knee popliteal artery stent construct.  Patency of the below knee popliteal artery, origin of the anterior tibial artery, and origin of the tibioperoneal trunk. There appears to be in-line flow through the anterior tibial artery to the ankle. Distal posterior tibial artery and peroneal artery occluded. IMPRESSION: Intraoperative angiogram demonstrates patent distal SFA and popliteal artery, with in-line flow through the anterior tibial artery to the ankle. Please refer to the dictated operative report for full details of intraoperative findings and procedure. Signed, Dulcy Fanny. Earleen Newport, DO [facility] Radiology Electronically Signed   By: Corrie Mckusick D.O.   On: 07/24/2015 20:38    Scheduled Meds: . amitriptyline  50 mg Oral QHS  . amLODipine  5 mg Oral Daily  . atorvastatin  40 mg Oral Daily  . canagliflozin  100 mg Oral QAC breakfast  . clopidogrel  75 mg Oral Daily  . docusate sodium  100 mg Oral Daily  . folic acid  1 mg Oral Daily  . glimepiride  4 mg Oral BID  . insulin aspart  0-15 Units Subcutaneous TID WC  . insulin glargine  30 Units Subcutaneous Daily  . irbesartan  75 mg Oral Daily  . mometasone-formoterol  2 puff Inhalation BID  . pantoprazole  40 mg Oral Daily  . potassium chloride  10 mEq Oral Daily  . thiamine  100 mg Oral Daily   Continuous Infusions: . sodium chloride 75 mL/hr at 07/25/15 2141   PRN Meds:.sodium chloride, acetaminophen **OR** acetaminophen, albuterol, alum & mag hydroxide-simeth, bisacodyl, guaiFENesin-dextromethorphan, hydrALAZINE, labetalol, magnesium sulfate 1 - 4 g bolus IVPB, metoprolol, morphine injection, ondansetron, oxyCODONE, phenol, polyethylene glycol, potassium chloride, sodium phosphate  ASSESSMENT AND PLAN:  1. PAD with acute restenosis of SFA stents, popliteal artery, anterior tibial artery and tibioperoneal trunk 2. S/P thrombectomy, endarterectomy, and fasciotomy 3. Hyperlipidemia 4. Essential hypertension 5. Controlled Type II  DM  Recommendation: Recovering well s/p thrombectomy and endarterectomy. Minimal bleeding for incision sites. Reports significant improvement in symptoms. Skin pink and warm. Will need Plavix indefinitely.   Rachel Bo, NP-C 07/26/2015, 8:28 AM  [facility] Pager: [phone] Office: [phone]

## 2015-07-26 NOTE — Evaluation (Signed)
Occupational Therapy Evaluation Patient Details Name: Terry Munoz MRN: [id number] DOB: [birth year] Today's Date: 07/26/2015    History of Present Illness Patient is a 65 yo male admitted 07/24/15 with RLE acute ischemia.  Patient s/p mult thrombectomies, angioplasty Rt pop artery, and 4 compartment fasciotomy on 07/24/15.    PMH:  Rt popliteal stent 04/2014, HTN, DM, tobacco use, COPD, OA, PVD   Clinical Impression   Pt was independent prior to admission. Currently experiencing R LE with weight bearing and requiring use of a walker. He needs minimum assistance for LB bathing and dressing. He is not interested in adaptive equipment and has all necessary DME at home. All education completed. No further OT needs.   Follow Up Recommendations  No OT follow up    Equipment Recommendations  None recommended by OT    Recommendations for Other Services       Precautions / Restrictions Precautions Precautions: Fall Restrictions Weight Bearing Restrictions: No Other Position/Activity Restrictions: decreased tolerance of WB on R LE      Mobility Bed Mobility Overal bed mobility: Modified Independent             General bed mobility comments: used rail, HOB up 20 degrees  Transfers Overall transfer level: Needs assistance Equipment used: Rolling walker (2 wheeled) Transfers: Sit to/from Stand Sit to Stand: Supervision         General transfer comment: stood with one hand on walker and one on bed, no physical assist needed, supervision for safety, pt with very minimal weight on R foot    Balance                                            ADL Overall ADL's : Needs assistance/impaired Eating/Feeding: Independent;Sitting   Grooming: Wash/dry hands;Min guard;Standing   Upper Body Bathing: Set up;Sitting   Lower Body Bathing: Minimal assistance;Sit to/from stand Lower Body Bathing Details (indicate cue type and reason): recommended long bath  sponge Upper Body Dressing : Set up;Sitting   Lower Body Dressing: Minimal assistance;Sit to/from stand Lower Body Dressing Details (indicate cue type and reason): plans to wear slip on shoes, not interested in AE Toilet Transfer: Min guard;RW;Ambulation   Toileting- Clothing Manipulation and Hygiene: Min guard (standing)     Tub/Shower Transfer Details (indicate cue type and reason): Educated pt in transfer to tub seat without stepping over edge of tub. Functional mobility during ADLs: Herbalist     Praxis      Pertinent Vitals/Pain Pain Assessment: Faces Faces Pain Scale: Hurts little more Pain Location: R LE only with WB Pain Descriptors / Indicators: Sore Pain Intervention(s): Monitored during session;Premedicated before session;Repositioned     Hand Dominance Right   Extremity/Trunk Assessment Upper Extremity Assessment Upper Extremity Assessment: Overall WFL for tasks assessed   Lower Extremity Assessment Lower Extremity Assessment: Defer to PT evaluation   Cervical / Trunk Assessment Cervical / Trunk Assessment: Normal   Communication Communication Communication: No difficulties   Cognition Arousal/Alertness: Awake/alert Behavior During Therapy: WFL for tasks assessed/performed Overall Cognitive Status: Within Functional Limits for tasks assessed                     General Comments       Exercises       Shoulder Instructions  Home Living Family/patient expects to be discharged to:: Private residence Living Arrangements: Spouse/significant other Available Help at Discharge: Family;Available 24 hours/day Type of Home: House Home Access: Ramped entrance     Home Layout: One level     Bathroom Shower/Tub: Teacher, early years/pre: Handicapped height     Home Equipment: Environmental consultant - 2 wheels;Wheelchair - [employer];Shower seat          Prior  Functioning/Environment Level of Independence: Independent        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    OT Diagnosis: Generalized weakness;Acute pain   OT Problem List:     OT Treatment/Interventions:      OT Goals(Current goals can be found in the care plan section) Acute Rehab OT Goals Patient Stated Goal: To be able to go home  OT Frequency:     Barriers to D/C:            Co-evaluation              End of Session Equipment Utilized During Treatment: Rolling walker  Activity Tolerance: Patient tolerated treatment well Patient left: in chair;with call bell/phone within reach;with family/visitor present   Time: [id number] OT Time Calculation (min): 19 min Charges:  OT General Charges $OT Visit: 1 Procedure OT Evaluation $OT Eval Low Complexity: 1 Procedure $OT Eval Moderate Complexity: 1 Procedure G-Codes:    Malka So 07/26/2015, 11:36 AM [phone]

## 2015-07-27 LAB — GLUCOSE, CAPILLARY
GLUCOSE-CAPILLARY: 129 mg/dL — AB (ref 65–99)
GLUCOSE-CAPILLARY: 130 mg/dL — AB (ref 65–99)
Glucose-Capillary: 121 mg/dL — ABNORMAL HIGH (ref 65–99)
Glucose-Capillary: 111 mg/dL — ABNORMAL HIGH (ref 65–99)
Glucose-Capillary: 142 mg/dL — ABNORMAL HIGH (ref 65–99)

## 2015-07-27 LAB — CK: Total CK: 1206 U/L — ABNORMAL HIGH (ref 49–397)

## 2015-07-27 NOTE — Care Management Note (Signed)
Case Management Note Marvetta Gibbons RN, BSN Unit 2W-Case Manager [phone]  Patient Details  Name: Terry Munoz MRN: [id number] Date of Birth: 08/06/1950  Subjective/Objective:      Pt admitted s/p fempop bypass graft              Action/Plan: PTA pt lived at home, orders for [facility] RN/PT/OT-  Spoke with pt at bedside- per conversation pt states that he has used [facility] services in the past- choice offered- per pt he wants to use [facility] for services- referral called to Manuela Schwartz with [facility] for HH--RN/PT/OT- per pt he has all needed DME at home- no DME needs noted. Plan to return home with [facility]  Expected Discharge Date:  07/28/15               Expected Discharge Plan:  [place]  In-House Referral:     Discharge planning Services  CM Consult  Post Acute Care Choice:  Home Health Choice offered to:  Patient  DME Arranged:  N/A DME Agency:  NA  HH Arranged:  RN, PT, OT HH Agency:  [facility]  Status of Service:  Completed, signed off  Medicare Important Message Given:    Date Medicare IM Given:    Medicare IM give by:    Date Additional Medicare IM Given:    Additional Medicare Important Message give by:     If discussed at [place] of Stay Meetings, dates discussed:    Discharge Disposition: Home/Home Health   Additional Comments:  Dawayne Patricia, RN 07/27/2015, 10:18 AM

## 2015-07-27 NOTE — Progress Notes (Addendum)
  Progress Note    07/27/2015 8:35 AM 3 Days Post-Op  Subjective:  "My foot feels better than it has in years"  Afebrile HR 80's-90's NSR [id number] systolic [id number]  [id number]   Filed Vitals:   07/26/15 1958 07/27/15 0521  BP: 114/63 135/67  Pulse: 87 91  Temp: 98.1 F (36.7 C) 98.4 F (36.9 C)  Resp: 18 18    Physical Exam: Cardiac:  regular Lungs:  Non labored Incisions:  Covered but clean Extremities:  Right foot with sensory and motor in tact   CBC    Component Value Date/Time   WBC 14.8* 07/25/2015 0319   RBC 4.10* 07/25/2015 0319   HGB 13.7 07/25/2015 0319   HCT 40.3 07/25/2015 0319   PLT 173 07/25/2015 0319   MCV 98.3 07/25/2015 0319   MCH 33.4 07/25/2015 0319   MCHC 34.0 07/25/2015 0319   RDW 13.1 07/25/2015 0319   LYMPHSABS 1.8 07/24/2015 1425   MONOABS 0.7 07/24/2015 1425   EOSABS 0.1 07/24/2015 1425   BASOSABS 0.0 07/24/2015 1425    BMET    Component Value Date/Time   NA 136 07/25/2015 0319   K 3.8 07/25/2015 0319   CL 101 07/25/2015 0319   CO2 26 07/25/2015 0319   GLUCOSE 108* 07/25/2015 0319   BUN 11 07/25/2015 0319   CREATININE 1.11 07/25/2015 0319   CALCIUM 8.3* 07/25/2015 0319   GFRNONAA >60 07/25/2015 0319   GFRAA >60 07/25/2015 0319    INR    Component Value Date/Time   INR 1.00 07/24/2015 1425     Intake/Output Summary (Last 24 hours) at 07/27/15 0835 Last data filed at 07/26/15 1700  Gross per 24 hour  Intake    480 ml  Output      0 ml  Net    480 ml     Assessment:  65 y.o. male is s/p:  1. Thrombectomy right femoropopliteal artery 2. Thrombectomy right anterior tibial artery  3. Thrombectomy right tibioperoneal trunk  4. Endarterectomy right anterior tibial artery  5. Endarterectomy right tibioperoneal trunk  6. Bovine patch angioplasty right popliteal artery 7. Right calf four-compartment fasciotomies 8. Right common femoral artery cannulation under ultrasound guidance Right leg runoff  3 Days  Post-Op  Plan: -pt doing well-I did not take down the dressings as Dr. Bridgett Larsson had just been in and inspected the wounds. -observe today and possibly discharge home tomorrow. -continue to increase mobilization -case management/face to face placed for [facility] needs.   Leontine Locket, PA-C [facility] [phone] 07/27/2015 8:35 AM   Addendum  I have independently interviewed and examined the patient, and I agree with the physician assistant's findings.  Some drainage of blood on dressing.  Pt's decreasing CK document calf muscle ischemia which is resolving.  There is no evidence of compartment syndrome, rather the right calf swelling is primarily some post-operative bleeding along with some baseline CVI.    - expect some continued drainage of hematoma via staples - Per Dr. Einar Gip, ASA and Plavix - Wean off oxygen, pt not on oxygen baseline - PT/OT - home tomorrow if off oxygen  Adele Barthel, MD [facility] [place] Office: [phone] Pager: [phone]  07/27/2015, 9:44 AM

## 2015-07-27 NOTE — Care Management Important Message (Signed)
Important Message  Patient Details  Name: Terry Munoz MRN: [id number] Date of Birth: 03-26-1951   Medicare Important Message Given:  Yes    Nathen May 07/27/2015, 12:19 PM

## 2015-07-28 ENCOUNTER — Telehealth: Payer: Self-pay | Admitting: Vascular Surgery

## 2015-07-28 LAB — GLUCOSE, CAPILLARY
GLUCOSE-CAPILLARY: 120 mg/dL — AB (ref 65–99)
Glucose-Capillary: 99 mg/dL (ref 65–99)

## 2015-07-28 LAB — CBC
HEMATOCRIT: 31 % — AB (ref 39.0–52.0)
HEMOGLOBIN: 10.7 g/dL — AB (ref 13.0–17.0)
MCH: 33 pg (ref 26.0–34.0)
MCHC: 34.5 g/dL (ref 30.0–36.0)
MCV: 95.7 fL (ref 78.0–100.0)
Platelets: 174 10*3/uL (ref 150–400)
RBC: 3.24 MIL/uL — AB (ref 4.22–5.81)
RDW: 12.8 % (ref 11.5–15.5)
WBC: 7.7 10*3/uL (ref 4.0–10.5)

## 2015-07-28 MED ORDER — OXYCODONE HCL 10 MG PO TABS: 10.0000 mg | ORAL_TABLET | ORAL | Status: DC | PRN

## 2015-07-28 MED ORDER — CLOPIDOGREL BISULFATE 75 MG PO TABS: 75.0000 mg | ORAL_TABLET | Freq: Every day | ORAL | Status: DC

## 2015-07-28 NOTE — Progress Notes (Signed)
Pt discharging home with wife.  All instructions and prescriptions given and reviewed.  Home health arranged.  All questions answered.

## 2015-07-28 NOTE — Telephone Encounter (Signed)
-----   Message from Mena Goes, RN sent at 07/28/2015  9:43 AM EDT ----- Regarding: schedule   ----- Message -----    From: [name], PA-C    Sent: 07/28/2015   7:41 AM      To: Vvs Charge Pool  S/p TE L leg, EA AT and TPT, BPA pop artery 07/24/15  F/u with Dr Bridgett Larsson next Friday  Thanks Maudie Mercury

## 2015-07-28 NOTE — Progress Notes (Addendum)
  [facility] Progress Note  Subjective  - POD #4  Right foot feels better. Ready to go home.   Objective Filed Vitals:   07/28/15 0618 07/28/15 0728  BP: 129/52   Pulse: 83 83  Temp: 98.1 F (36.7 C)   Resp: 18 18    Intake/Output Summary (Last 24 hours) at 07/28/15 0732 Last data filed at 07/28/15 [id number]  Gross per 24 hour  Intake    720 ml  Output   1000 ml  Net   -280 ml    Right leg swollen. Right lower leg incisions without drainage. Blister on medial calf and lateral calf.  Doppler flow right AT>PT and peroneal  Assessment/Planning: 65 y.o. male is s/p: TE L leg, EA AT and TPT, BPA pop artery 4 Days Post-Op   Right leg and foot symptoms continue to improve.  Now off oxygen.  No further bleeding from incisions.  Emphasized the importance of leg elevation to minimize swelling.  D/c home today on ASA and plavix.   [name] 07/28/2015 7:32 AM --  Laboratory CBC    Component Value Date/Time   WBC 7.7 07/28/2015 0321   HGB 10.7* 07/28/2015 0321   HCT 31.0* 07/28/2015 0321   PLT 174 07/28/2015 0321    BMET    Component Value Date/Time   NA 136 07/25/2015 0319   K 3.8 07/25/2015 0319   CL 101 07/25/2015 0319   CO2 26 07/25/2015 0319   GLUCOSE 108* 07/25/2015 0319   BUN 11 07/25/2015 0319   CREATININE 1.11 07/25/2015 0319   CALCIUM 8.3* 07/25/2015 0319   GFRNONAA >60 07/25/2015 0319   GFRAA >60 07/25/2015 0319    COAG Lab Results  Component Value Date   INR 1.00 07/24/2015   INR 0.87 04/18/2014   No results found for: PTT  Antibiotics Anti-infectives    Start     Dose/Rate Route Frequency Ordered Stop   07/24/15 2300  cefUROXime (ZINACEF) 1.5 g in dextrose 5 % 50 mL IVPB     1.5 g 100 mL/hr over 30 Minutes Intravenous Every 12 hours 07/24/15 1954 07/25/15 1151   07/24/15 1532  dextrose 5 % with cefUROXime (ZINACEF) ADS Med    Comments:  Henrine Screws   : cabinet override      07/24/15 1532 07/24/15 Violet, PA-C [facility] Office: [phone] Pager: [phone] 07/28/2015 7:32 AM   Addendum  I have independently interviewed and examined the patient, and I agree with the physician assistant's findings.  Ok to D/C home with follow up next Friday for wound check and staple removal.  Adele Barthel, MD [facility]: [phone] Pager: [phone]  07/28/2015, 8:26 AM

## 2015-07-28 NOTE — Telephone Encounter (Signed)
sched appt 5/12 at 3:45. Lm on hm# to inform pt of appt.

## 2015-07-29 DIAGNOSIS — J449 Chronic obstructive pulmonary disease, unspecified: Secondary | ICD-10-CM | POA: Diagnosis not present

## 2015-07-29 DIAGNOSIS — I10 Essential (primary) hypertension: Secondary | ICD-10-CM | POA: Diagnosis not present

## 2015-07-29 DIAGNOSIS — Z5181 Encounter for therapeutic drug level monitoring: Secondary | ICD-10-CM | POA: Diagnosis not present

## 2015-07-29 DIAGNOSIS — M159 Polyosteoarthritis, unspecified: Secondary | ICD-10-CM | POA: Diagnosis not present

## 2015-07-29 DIAGNOSIS — F17219 Nicotine dependence, cigarettes, with unspecified nicotine-induced disorders: Secondary | ICD-10-CM | POA: Diagnosis not present

## 2015-07-29 DIAGNOSIS — Z48812 Encounter for surgical aftercare following surgery on the circulatory system: Secondary | ICD-10-CM | POA: Diagnosis not present

## 2015-07-29 DIAGNOSIS — I739 Peripheral vascular disease, unspecified: Secondary | ICD-10-CM | POA: Diagnosis not present

## 2015-07-29 DIAGNOSIS — Z794 Long term (current) use of insulin: Secondary | ICD-10-CM | POA: Diagnosis not present

## 2015-07-29 DIAGNOSIS — E119 Type 2 diabetes mellitus without complications: Secondary | ICD-10-CM | POA: Diagnosis not present

## 2015-07-29 DIAGNOSIS — F329 Major depressive disorder, single episode, unspecified: Secondary | ICD-10-CM | POA: Diagnosis not present

## 2015-07-30 ENCOUNTER — Encounter: Payer: Self-pay | Admitting: Vascular Surgery

## 2015-07-30 DIAGNOSIS — I10 Essential (primary) hypertension: Secondary | ICD-10-CM | POA: Diagnosis not present

## 2015-07-30 DIAGNOSIS — J449 Chronic obstructive pulmonary disease, unspecified: Secondary | ICD-10-CM | POA: Diagnosis not present

## 2015-07-30 DIAGNOSIS — I739 Peripheral vascular disease, unspecified: Secondary | ICD-10-CM | POA: Diagnosis not present

## 2015-07-30 DIAGNOSIS — Z48812 Encounter for surgical aftercare following surgery on the circulatory system: Secondary | ICD-10-CM | POA: Diagnosis not present

## 2015-07-30 DIAGNOSIS — M159 Polyosteoarthritis, unspecified: Secondary | ICD-10-CM | POA: Diagnosis not present

## 2015-07-30 DIAGNOSIS — E119 Type 2 diabetes mellitus without complications: Secondary | ICD-10-CM | POA: Diagnosis not present

## 2015-08-02 NOTE — Discharge Summary (Signed)
[facility] Discharge Summary  Terry Munoz 03/18/1951 65 y.o. male  [id number]  Admission Date: 07/24/2015  Discharge Date: 07/28/2015  Physician: Adele Barthel, MD  Admission Diagnosis: Limb ischemia [I99.8]  HPI:   This is a 65 y.o. male who presented to the [facility] ED with sudden onset of pain in right leg. This patient previously underwent a R distal SFA/popliteal artery stent with Dr. Einar Gip in 05/01/14. He presents today with acute onset of pain in right leg. He notes numbness in the right foot with decreased movement. On his previous angiogram, he had only AT runoff on the right side. This patient's atherosclerotic risk factors include: HTN, DM, and prior smoking.  Hospital Course:  The patient was admitted to the hospital and taken to the operating room on 07/24/2015 and underwent:   1. Thrombectomy right femoropopliteal artery 2. Thrombectomy right anterior tibial artery  3. Thrombectomy right tibioperoneal trunk  4. Endarterectomy right anterior tibial artery  5. Endarterectomy right tibioperoneal trunk  6. Bovine patch angioplasty right popliteal artery 7. Right calf four-compartment fasciotomies 8. Right common femoral artery cannulation under ultrasound guidance 9. Right leg runoff   The patient tolerated the procedure well and was transported to the PACU in stable condition. He was started on a heparin drip six hours post-op.   POD 1: The patient's right leg pain was improved. He reportedly had some decrease in doppler strength overnight. His foot appeared well perfused. He had a monophasic right AT. His arterial duplex revealed no evidence of lower extremity arterial occlusion with patent stentsHe had no evidence of compartment syndrome. Cardiology was consulted and recommended ASA and Plavix. His CK was elevated   POD 2: His right leg pain continued to improve.The patient had some swelling of his right leg likely secondary to  post-operative bleeding and some baseline chronic venous insufficiency. His CK was decreasing as his muscle ischemia was resolving. He was weaning off of oxygen.  POD 3-4: The patient was weaned off oxygen. His leg symptoms continued to improve. He was ambulating well with assistance. His incisions were intact without drainage. He was discharged home on POD 4 in good condition.      CBC    Component Value Date/Time   WBC 7.7 07/28/2015 0321   RBC 3.24* 07/28/2015 0321   HGB 10.7* 07/28/2015 0321   HCT 31.0* 07/28/2015 0321   PLT 174 07/28/2015 0321   MCV 95.7 07/28/2015 0321   MCH 33.0 07/28/2015 0321   MCHC 34.5 07/28/2015 0321   RDW 12.8 07/28/2015 0321   LYMPHSABS 1.8 07/24/2015 1425   MONOABS 0.7 07/24/2015 1425   EOSABS 0.1 07/24/2015 1425   BASOSABS 0.0 07/24/2015 1425    BMET    Component Value Date/Time   NA 136 07/25/2015 0319   K 3.8 07/25/2015 0319   CL 101 07/25/2015 0319   CO2 26 07/25/2015 0319   GLUCOSE 108* 07/25/2015 0319   BUN 11 07/25/2015 0319   CREATININE 1.11 07/25/2015 0319   CALCIUM 8.3* 07/25/2015 0319   GFRNONAA >60 07/25/2015 0319   GFRAA >60 07/25/2015 0319     Discharge Instructions:   The patient is discharged to home with extensive instructions on wound care and progressive ambulation.  They are instructed not to drive or perform any heavy lifting until returning to see the physician in his office.  Discharge Instructions    Call MD for:  redness, tenderness, or signs of infection (pain, swelling, bleeding, redness, odor or green/yellow  discharge around incision site)    Complete by:  As directed      Call MD for:  severe or increased pain, loss or decreased feeling  in affected limb(s)    Complete by:  As directed      Call MD for:  temperature >100.5    Complete by:  As directed      Discharge wound care:    Complete by:  As directed   Wash over right leg wounds gently with soap and water daily and pat dry. Apply petroleum jelly to  gauze and apply on blisters of lower leg. Wrap leg with gauze wrap.     Driving Restrictions    Complete by:  As directed   No driving for 2 weeks     Increase activity slowly    Complete by:  As directed   Keep legs elevated above the level of heart to minimize swelling. Continue to wear TED hose on left leg while not walking.   Walk with assistance use walker or cane as needed     Lifting restrictions    Complete by:  As directed   No lifting for 2 weeks     Resume previous diet    Complete by:  As directed            Discharge Diagnosis:  Limb ischemia [I99.8]  Secondary Diagnosis: Patient Active Problem List   Diagnosis Date Noted  . 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 PVD (peripheral vascular disease) with claudication ([place]) 05/01/2014  . COPD exacerbation ([place]) 04/21/2014  . possible Aspiration pneumonia 04/21/2014  . DM type 2 (diabetes mellitus, type 2) ([place]) 04/21/2014  . Alcohol withdrawal delirium ([place]) 04/20/2014  . Diabetes mellitus, stable ([place]) 04/20/2014  . Essential hypertension 04/20/2014  . Hyponatremia 04/20/2014  . Tobacco abuse 04/20/2014  . Claudication of right lower extremity ([place]) 04/18/2014  . Peripheral artery disease ([place]) 04/18/2014   Past Medical History  Diagnosis Date  . Hypertension   . COPD (chronic obstructive pulmonary disease) ([place])   . Otitis media   . Sinusitis   . Insomnia   . Osteoarthritis   . Mouth ulcers   . Kidney stones   . Hyponatremia 03/2014  . Peripheral vascular disease ([place])   . Diabetes mellitus without complication ([place])     INSULIN DEPENDENT       Medication List    TAKE these medications        albuterol 108 (90 Base) MCG/ACT inhaler  Commonly known as:  PROVENTIL HFA;VENTOLIN HFA  Inhale 1-2 puffs into the lungs every 6 (six) hours as needed for wheezing or shortness of breath.     albuterol (2.5 MG/3ML) 0.083% nebulizer solution  Commonly known as:  PROVENTIL  Take 2.5 mg by  nebulization every 6 (six) hours as needed for wheezing or shortness of breath.     amitriptyline 50 MG tablet  Commonly known as:  ELAVIL  Take 50 mg by mouth at bedtime.     amLODipine 5 MG tablet  Commonly known as:  NORVASC  Take 5 mg by mouth daily.     aspirin 325 MG tablet  Take 325 mg by mouth daily.     atorvastatin 40 MG tablet  Commonly known as:  LIPITOR  Take 40 mg by mouth daily.     budesonide-formoterol 160-4.5 MCG/ACT inhaler  Commonly known as:  SYMBICORT  Inhale 2 puffs into the lungs 2 (two) times daily.  clopidogrel 75 MG tablet  Commonly known as:  PLAVIX  Take 1 tablet (75 mg total) by mouth daily.     folic acid 1 MG tablet  Commonly known as:  FOLVITE  Take 1 tablet (1 mg total) by mouth daily.     glimepiride 4 MG tablet  Commonly known as:  AMARYL  Take 4 mg by mouth 2 (two) times daily.     insulin glargine 100 UNIT/ML injection  Commonly known as:  LANTUS  Inject 0.3 mLs (30 Units total) into the skin daily.     irbesartan 75 MG tablet  Commonly known as:  AVAPRO  Take 1 tablet (75 mg total) by mouth daily.     JARDIANCE 10 MG Tabs tablet  Generic drug:  empagliflozin  Take 10 mg by mouth daily.     metoprolol tartrate 25 MG tablet  Commonly known as:  LOPRESSOR  Take 0.5 tablets (12.5 mg total) by mouth 2 (two) times daily.     omeprazole 20 MG capsule  Commonly known as:  PRILOSEC  Take 20 mg by mouth daily as needed (heartburn).     Oxycodone HCl 10 MG Tabs  Take 1 tablet (10 mg total) by mouth every 4 (four) hours as needed.     potassium chloride 10 MEQ CR capsule  Commonly known as:  MICRO-K  Take 10 mEq by mouth daily.     sucralfate 1 g tablet  Commonly known as:  CARAFATE  Take 1 tablet (1 g total) by mouth 4 (four) times daily -  with meals and at bedtime.     thiamine 100 MG tablet  Take 1 tablet (100 mg total) by mouth daily.        Oxycodone#30 No Refill  Disposition: Home  Patient's condition: is  Good  Follow up: 1. Dr. Bridgett Larsson on 08/06/15   Virgina Jock, PA-C [facility] [phone] 08/02/2015  8:41 AM   Addendum  I have independently interviewed and examined the patient, and I agree with the physician assistant's discharge summary.  This patient underwent thrombectomy of his right leg due to acute occlusion of his femoropopliteal stents.  I also was forced to do an endarterectomy of the proximal right anterior tibial artery and four compartment fasciotomies.  He was started post-operative on anticoagulation to try help with recannulation of his distal digital arteries which were filling poorly on the completion angiogram.  He developed some bleeding in his fasciotomy incisions on POD #3, so I felt that I had to discontinue such.  He loaded on both Plavix and ASA by Dr. Einar Gip as an adjunct to help keep his stents open.  By discharge, his admission sx had resolved.  He has acceptable swelling in his right calf due to some hematoma from the anticoagulation and fasciotomies.  He was able to ambulate with a walker, pain controlled with pills, and tolerating his dinner.  He will be seen back in the office on 08/06/15 for staple removal and wound check.   Adele Barthel, MD [facility] [place] Office: [phone] Pager: [phone]  08/03/2015, 12:33 PM   - For VQI Registry use --- Instructions: Press F2 to tab through selections.  Delete question if not applicable.   Post-op:  Wound infection: No  Graft infection: No  Transfusion: No   New Arrhythmia: No Ipsilateral amputation: No, [ ]  Minor, [ ]  BKA, [ ]  AKA D/C Ambulatory Status: Ambulatory with Assistance  Complications: MI: No, [ ]  Troponin  only, [ ]  EKG or Clinical CHF: No Resp failure:No, [ ]  Pneumonia, [ ]  Ventilator Chg in renal function: No, [ ]  Inc. Cr > 0.5, [ ]  Temp. Dialysis, [ ]  Permanent dialysis Stroke: No, [ ]  Minor, [ ]  Major Return to OR: No  Reason for return to OR:  [ ]  Bleeding, [ ]  Infection, [ ]  Thrombosis, [ ]  Revision  Discharge medications: Statin use:  yes ASA use:  yes Plavix use:  yes Beta blocker use: yes Coumadin use: no

## 2015-08-03 DIAGNOSIS — I739 Peripheral vascular disease, unspecified: Secondary | ICD-10-CM | POA: Diagnosis not present

## 2015-08-03 DIAGNOSIS — J449 Chronic obstructive pulmonary disease, unspecified: Secondary | ICD-10-CM | POA: Diagnosis not present

## 2015-08-03 DIAGNOSIS — M159 Polyosteoarthritis, unspecified: Secondary | ICD-10-CM | POA: Diagnosis not present

## 2015-08-03 DIAGNOSIS — I10 Essential (primary) hypertension: Secondary | ICD-10-CM | POA: Diagnosis not present

## 2015-08-03 DIAGNOSIS — E119 Type 2 diabetes mellitus without complications: Secondary | ICD-10-CM | POA: Diagnosis not present

## 2015-08-03 DIAGNOSIS — Z48812 Encounter for surgical aftercare following surgery on the circulatory system: Secondary | ICD-10-CM | POA: Diagnosis not present

## 2015-08-05 DIAGNOSIS — E119 Type 2 diabetes mellitus without complications: Secondary | ICD-10-CM | POA: Diagnosis not present

## 2015-08-05 DIAGNOSIS — I10 Essential (primary) hypertension: Secondary | ICD-10-CM | POA: Diagnosis not present

## 2015-08-05 DIAGNOSIS — M159 Polyosteoarthritis, unspecified: Secondary | ICD-10-CM | POA: Diagnosis not present

## 2015-08-05 DIAGNOSIS — Z48812 Encounter for surgical aftercare following surgery on the circulatory system: Secondary | ICD-10-CM | POA: Diagnosis not present

## 2015-08-05 DIAGNOSIS — J449 Chronic obstructive pulmonary disease, unspecified: Secondary | ICD-10-CM | POA: Diagnosis not present

## 2015-08-05 DIAGNOSIS — I739 Peripheral vascular disease, unspecified: Secondary | ICD-10-CM | POA: Diagnosis not present

## 2015-08-05 NOTE — Progress Notes (Signed)
    Postoperative Visit   History of Present Illness  BENNY TUMOLO is a 65 y.o. year old male who presents for postoperative follow-up for:   PROCEDURE: 1. Thrombectomy right femoropopliteal artery 2. Thrombectomy right anterior tibial artery  3. Thrombectomy right tibioperoneal trunk  4. Endarterectomy right anterior tibial artery  5. Endarterectomy right tibioperoneal trunk  6. Bovine patch angioplasty right popliteal artery 7. Right calf four-compartment fasciotomies 8. Right common femoral artery cannulation under ultrasound guidance 9. Right leg runoff  (Date: 07/24/15).    The patient notes near resolution of lower extremity symptoms.  The patient is able to complete their activities of daily living.  The patient's current symptoms are: pain in lateral incision.  Some blistering in skin.  For VQI Use Only  PRE-ADM LIVING: Home  AMB STATUS: Ambulatory  Physical Examination  Filed Vitals:   08/06/15 1536  BP: 123/81  Pulse: 107   RLE: lateral fasciotomy incision staples in place, some staple reaction, TTP light touch, medial fasciotomy incision staples in place, some staple reaction no TTP, palpable R AT, 2+ edema, some blistered areas of skin which have intact skin underneath  Medical Decision Making  JEREMAI RUSSIN is a 65 y.o. year old male who presents s/p TE R leg, EA AT & PT, BPA R pop artery  Pt's R leg is "better than in years" per pt He has some cellulitis in the lateral fasciotomy. With the swelling, likely due to improved blood flow and venous disease and hematoma due to brief period of anticoagulation, I would not be surprise if he decompressed the underling liquefied hematoma Half of staples removed today, half next week. Thank you for allowing [place] to participate in this patient's care.   Adele Barthel, MD [facility] [place] Office: [phone] Pager: [phone]  08/05/2015, 10:28 PM

## 2015-08-06 ENCOUNTER — Encounter: Payer: Self-pay | Admitting: Vascular Surgery

## 2015-08-06 ENCOUNTER — Ambulatory Visit (INDEPENDENT_AMBULATORY_CARE_PROVIDER_SITE_OTHER): Payer: Medicare Other | Admitting: Vascular Surgery

## 2015-08-06 VITALS — BP 123/81 | HR 107 | Ht 68.0 in | Wt 163.0 lb

## 2015-08-06 DIAGNOSIS — I743 Embolism and thrombosis of arteries of the lower extremities: Secondary | ICD-10-CM

## 2015-08-06 DIAGNOSIS — I70201 Unspecified atherosclerosis of native arteries of extremities, right leg: Secondary | ICD-10-CM

## 2015-08-06 MED ORDER — AMOXICILLIN-POT CLAVULANATE 875-125 MG PO TABS: 1.0000 | ORAL_TABLET | Freq: Two times a day (BID) | ORAL | Status: DC

## 2015-08-09 DIAGNOSIS — E119 Type 2 diabetes mellitus without complications: Secondary | ICD-10-CM | POA: Diagnosis not present

## 2015-08-09 DIAGNOSIS — M159 Polyosteoarthritis, unspecified: Secondary | ICD-10-CM | POA: Diagnosis not present

## 2015-08-09 DIAGNOSIS — Z48812 Encounter for surgical aftercare following surgery on the circulatory system: Secondary | ICD-10-CM | POA: Diagnosis not present

## 2015-08-09 DIAGNOSIS — I10 Essential (primary) hypertension: Secondary | ICD-10-CM | POA: Diagnosis not present

## 2015-08-09 DIAGNOSIS — J449 Chronic obstructive pulmonary disease, unspecified: Secondary | ICD-10-CM | POA: Diagnosis not present

## 2015-08-09 DIAGNOSIS — I739 Peripheral vascular disease, unspecified: Secondary | ICD-10-CM | POA: Diagnosis not present

## 2015-08-10 DIAGNOSIS — M159 Polyosteoarthritis, unspecified: Secondary | ICD-10-CM | POA: Diagnosis not present

## 2015-08-10 DIAGNOSIS — I10 Essential (primary) hypertension: Secondary | ICD-10-CM | POA: Diagnosis not present

## 2015-08-10 DIAGNOSIS — J449 Chronic obstructive pulmonary disease, unspecified: Secondary | ICD-10-CM | POA: Diagnosis not present

## 2015-08-10 DIAGNOSIS — I739 Peripheral vascular disease, unspecified: Secondary | ICD-10-CM | POA: Diagnosis not present

## 2015-08-10 DIAGNOSIS — Z48812 Encounter for surgical aftercare following surgery on the circulatory system: Secondary | ICD-10-CM | POA: Diagnosis not present

## 2015-08-10 DIAGNOSIS — E119 Type 2 diabetes mellitus without complications: Secondary | ICD-10-CM | POA: Diagnosis not present

## 2015-08-11 DIAGNOSIS — I10 Essential (primary) hypertension: Secondary | ICD-10-CM | POA: Diagnosis not present

## 2015-08-11 DIAGNOSIS — M159 Polyosteoarthritis, unspecified: Secondary | ICD-10-CM | POA: Diagnosis not present

## 2015-08-11 DIAGNOSIS — J449 Chronic obstructive pulmonary disease, unspecified: Secondary | ICD-10-CM | POA: Diagnosis not present

## 2015-08-11 DIAGNOSIS — Z48812 Encounter for surgical aftercare following surgery on the circulatory system: Secondary | ICD-10-CM | POA: Diagnosis not present

## 2015-08-11 DIAGNOSIS — E119 Type 2 diabetes mellitus without complications: Secondary | ICD-10-CM | POA: Diagnosis not present

## 2015-08-11 DIAGNOSIS — I739 Peripheral vascular disease, unspecified: Secondary | ICD-10-CM | POA: Diagnosis not present

## 2015-08-12 ENCOUNTER — Encounter: Payer: Self-pay | Admitting: Vascular Surgery

## 2015-08-12 ENCOUNTER — Ambulatory Visit (INDEPENDENT_AMBULATORY_CARE_PROVIDER_SITE_OTHER): Payer: Medicare Other | Admitting: Vascular Surgery

## 2015-08-12 VITALS — BP 132/76 | HR 96 | Ht 68.0 in | Wt 163.0 lb

## 2015-08-12 DIAGNOSIS — I70201 Unspecified atherosclerosis of native arteries of extremities, right leg: Secondary | ICD-10-CM

## 2015-08-12 DIAGNOSIS — I10 Essential (primary) hypertension: Secondary | ICD-10-CM | POA: Diagnosis not present

## 2015-08-12 DIAGNOSIS — J449 Chronic obstructive pulmonary disease, unspecified: Secondary | ICD-10-CM | POA: Diagnosis not present

## 2015-08-12 DIAGNOSIS — I739 Peripheral vascular disease, unspecified: Secondary | ICD-10-CM | POA: Diagnosis not present

## 2015-08-12 DIAGNOSIS — I743 Embolism and thrombosis of arteries of the lower extremities: Secondary | ICD-10-CM

## 2015-08-12 DIAGNOSIS — E119 Type 2 diabetes mellitus without complications: Secondary | ICD-10-CM | POA: Diagnosis not present

## 2015-08-12 DIAGNOSIS — M159 Polyosteoarthritis, unspecified: Secondary | ICD-10-CM | POA: Diagnosis not present

## 2015-08-12 DIAGNOSIS — Z48812 Encounter for surgical aftercare following surgery on the circulatory system: Secondary | ICD-10-CM | POA: Diagnosis not present

## 2015-08-12 NOTE — Progress Notes (Signed)
    Postoperative Visit   History of Present Illness  Terry Munoz is a 65 y.o. year old male who presents for postoperative follow-up for:   1. Thrombectomy right femoropopliteal artery 2. Thrombectomy right anterior tibial artery  3. Thrombectomy right tibioperoneal trunk  4. Endarterectomy right anterior tibial artery  5. Endarterectomy right tibioperoneal trunk  6. Bovine patch angioplasty right popliteal artery 7. Right calf four-compartment fasciotomies 8. Right common femoral artery cannulation under ultrasound guidance 9. Right leg runoff (Date: 07/24/15).    The patient's wounds are not healed.  The patient notes resolution of lower extremity symptoms.  The patient is not able to complete their activities of daily living.  The patient's current symptoms are: tenderness with right foot with weight bearing. Limited drainage from lateral incision.  For VQI Use Only  PRE-ADM LIVING: Home  AMB STATUS: Ambulatory with Assistance   Physical Examination  Filed Vitals:   08/12/15 1330  BP: 132/76  Pulse: 96   RLE: Medial incision is healing, few staples still present, black patches of skin, no signs of cellulitis; lateral incision is healing, few staples still present, black patches of skin present without frank breakdown, mild blanching erythema in lower leg, edema 1-2+, no drainage from calf  Medical Decision Making  Terry Munoz is a 65 y.o. year old male who presents s/p TE R leg, EA R ATA and TPT, BPA R BK pop, R 4-compartment fasciotomy.   Pt will finish his abx regimen.  I gave the patient detailed wound care instructions.  I would not be surprised if the lateral calf continues to drain given the hematoma likely present in the fasciotomy incisions.    If patient develops an abscess in the lateral compartment, might need to perform an I&D, but none currently at this point.  Will have pt follow up for wound check in 1-2 weeks.  Thank you for  allowing [place] to participate in this patient's care.  Adele Barthel, MD [facility] [place] Office: [phone] Pager: [phone]  08/12/2015, 1:48 PM

## 2015-08-13 ENCOUNTER — Ambulatory Visit: Payer: Medicare Other | Admitting: Vascular Surgery

## 2015-08-13 DIAGNOSIS — E119 Type 2 diabetes mellitus without complications: Secondary | ICD-10-CM | POA: Diagnosis not present

## 2015-08-13 DIAGNOSIS — I10 Essential (primary) hypertension: Secondary | ICD-10-CM | POA: Diagnosis not present

## 2015-08-13 DIAGNOSIS — I739 Peripheral vascular disease, unspecified: Secondary | ICD-10-CM | POA: Diagnosis not present

## 2015-08-13 DIAGNOSIS — J449 Chronic obstructive pulmonary disease, unspecified: Secondary | ICD-10-CM | POA: Diagnosis not present

## 2015-08-13 DIAGNOSIS — M159 Polyosteoarthritis, unspecified: Secondary | ICD-10-CM | POA: Diagnosis not present

## 2015-08-13 DIAGNOSIS — Z48812 Encounter for surgical aftercare following surgery on the circulatory system: Secondary | ICD-10-CM | POA: Diagnosis not present

## 2015-08-17 DIAGNOSIS — I10 Essential (primary) hypertension: Secondary | ICD-10-CM | POA: Diagnosis not present

## 2015-08-17 DIAGNOSIS — M159 Polyosteoarthritis, unspecified: Secondary | ICD-10-CM | POA: Diagnosis not present

## 2015-08-17 DIAGNOSIS — Z48812 Encounter for surgical aftercare following surgery on the circulatory system: Secondary | ICD-10-CM | POA: Diagnosis not present

## 2015-08-17 DIAGNOSIS — E119 Type 2 diabetes mellitus without complications: Secondary | ICD-10-CM | POA: Diagnosis not present

## 2015-08-17 DIAGNOSIS — J449 Chronic obstructive pulmonary disease, unspecified: Secondary | ICD-10-CM | POA: Diagnosis not present

## 2015-08-17 DIAGNOSIS — I739 Peripheral vascular disease, unspecified: Secondary | ICD-10-CM | POA: Diagnosis not present

## 2015-08-18 DIAGNOSIS — I739 Peripheral vascular disease, unspecified: Secondary | ICD-10-CM | POA: Diagnosis not present

## 2015-08-18 DIAGNOSIS — J449 Chronic obstructive pulmonary disease, unspecified: Secondary | ICD-10-CM | POA: Diagnosis not present

## 2015-08-18 DIAGNOSIS — E119 Type 2 diabetes mellitus without complications: Secondary | ICD-10-CM | POA: Diagnosis not present

## 2015-08-18 DIAGNOSIS — M159 Polyosteoarthritis, unspecified: Secondary | ICD-10-CM | POA: Diagnosis not present

## 2015-08-18 DIAGNOSIS — I10 Essential (primary) hypertension: Secondary | ICD-10-CM | POA: Diagnosis not present

## 2015-08-18 DIAGNOSIS — Z48812 Encounter for surgical aftercare following surgery on the circulatory system: Secondary | ICD-10-CM | POA: Diagnosis not present

## 2015-08-19 DIAGNOSIS — I739 Peripheral vascular disease, unspecified: Secondary | ICD-10-CM | POA: Diagnosis not present

## 2015-08-19 DIAGNOSIS — E119 Type 2 diabetes mellitus without complications: Secondary | ICD-10-CM | POA: Diagnosis not present

## 2015-08-19 DIAGNOSIS — M159 Polyosteoarthritis, unspecified: Secondary | ICD-10-CM | POA: Diagnosis not present

## 2015-08-19 DIAGNOSIS — J449 Chronic obstructive pulmonary disease, unspecified: Secondary | ICD-10-CM | POA: Diagnosis not present

## 2015-08-19 DIAGNOSIS — Z48812 Encounter for surgical aftercare following surgery on the circulatory system: Secondary | ICD-10-CM | POA: Diagnosis not present

## 2015-08-19 DIAGNOSIS — I10 Essential (primary) hypertension: Secondary | ICD-10-CM | POA: Diagnosis not present

## 2015-08-24 DIAGNOSIS — E119 Type 2 diabetes mellitus without complications: Secondary | ICD-10-CM | POA: Diagnosis not present

## 2015-08-24 DIAGNOSIS — Z48812 Encounter for surgical aftercare following surgery on the circulatory system: Secondary | ICD-10-CM | POA: Diagnosis not present

## 2015-08-24 DIAGNOSIS — J449 Chronic obstructive pulmonary disease, unspecified: Secondary | ICD-10-CM | POA: Diagnosis not present

## 2015-08-24 DIAGNOSIS — M159 Polyosteoarthritis, unspecified: Secondary | ICD-10-CM | POA: Diagnosis not present

## 2015-08-24 DIAGNOSIS — I739 Peripheral vascular disease, unspecified: Secondary | ICD-10-CM | POA: Diagnosis not present

## 2015-08-24 DIAGNOSIS — I10 Essential (primary) hypertension: Secondary | ICD-10-CM | POA: Diagnosis not present

## 2015-08-25 DIAGNOSIS — E119 Type 2 diabetes mellitus without complications: Secondary | ICD-10-CM | POA: Diagnosis not present

## 2015-08-25 DIAGNOSIS — I10 Essential (primary) hypertension: Secondary | ICD-10-CM | POA: Diagnosis not present

## 2015-08-25 DIAGNOSIS — M159 Polyosteoarthritis, unspecified: Secondary | ICD-10-CM | POA: Diagnosis not present

## 2015-08-25 DIAGNOSIS — J449 Chronic obstructive pulmonary disease, unspecified: Secondary | ICD-10-CM | POA: Diagnosis not present

## 2015-08-25 DIAGNOSIS — Z48812 Encounter for surgical aftercare following surgery on the circulatory system: Secondary | ICD-10-CM | POA: Diagnosis not present

## 2015-08-25 DIAGNOSIS — I739 Peripheral vascular disease, unspecified: Secondary | ICD-10-CM | POA: Diagnosis not present

## 2015-08-26 DIAGNOSIS — E119 Type 2 diabetes mellitus without complications: Secondary | ICD-10-CM | POA: Diagnosis not present

## 2015-08-26 DIAGNOSIS — Z48812 Encounter for surgical aftercare following surgery on the circulatory system: Secondary | ICD-10-CM | POA: Diagnosis not present

## 2015-08-26 DIAGNOSIS — J449 Chronic obstructive pulmonary disease, unspecified: Secondary | ICD-10-CM | POA: Diagnosis not present

## 2015-08-26 DIAGNOSIS — M159 Polyosteoarthritis, unspecified: Secondary | ICD-10-CM | POA: Diagnosis not present

## 2015-08-26 DIAGNOSIS — I10 Essential (primary) hypertension: Secondary | ICD-10-CM | POA: Diagnosis not present

## 2015-08-26 DIAGNOSIS — I739 Peripheral vascular disease, unspecified: Secondary | ICD-10-CM | POA: Diagnosis not present

## 2015-08-27 ENCOUNTER — Encounter: Payer: Self-pay | Admitting: Vascular Surgery

## 2015-08-30 DIAGNOSIS — I739 Peripheral vascular disease, unspecified: Secondary | ICD-10-CM | POA: Diagnosis not present

## 2015-08-30 DIAGNOSIS — Z48812 Encounter for surgical aftercare following surgery on the circulatory system: Secondary | ICD-10-CM | POA: Diagnosis not present

## 2015-08-30 DIAGNOSIS — I10 Essential (primary) hypertension: Secondary | ICD-10-CM | POA: Diagnosis not present

## 2015-08-30 DIAGNOSIS — M159 Polyosteoarthritis, unspecified: Secondary | ICD-10-CM | POA: Diagnosis not present

## 2015-08-30 DIAGNOSIS — J449 Chronic obstructive pulmonary disease, unspecified: Secondary | ICD-10-CM | POA: Diagnosis not present

## 2015-08-30 DIAGNOSIS — E119 Type 2 diabetes mellitus without complications: Secondary | ICD-10-CM | POA: Diagnosis not present

## 2015-09-01 DIAGNOSIS — Z48812 Encounter for surgical aftercare following surgery on the circulatory system: Secondary | ICD-10-CM | POA: Diagnosis not present

## 2015-09-01 DIAGNOSIS — E119 Type 2 diabetes mellitus without complications: Secondary | ICD-10-CM | POA: Diagnosis not present

## 2015-09-01 DIAGNOSIS — J449 Chronic obstructive pulmonary disease, unspecified: Secondary | ICD-10-CM | POA: Diagnosis not present

## 2015-09-01 DIAGNOSIS — M159 Polyosteoarthritis, unspecified: Secondary | ICD-10-CM | POA: Diagnosis not present

## 2015-09-01 DIAGNOSIS — I10 Essential (primary) hypertension: Secondary | ICD-10-CM | POA: Diagnosis not present

## 2015-09-01 DIAGNOSIS — I739 Peripheral vascular disease, unspecified: Secondary | ICD-10-CM | POA: Diagnosis not present

## 2015-09-02 ENCOUNTER — Encounter (HOSPITAL_COMMUNITY): Payer: Self-pay

## 2015-09-02 ENCOUNTER — Telehealth: Payer: Self-pay

## 2015-09-02 ENCOUNTER — Emergency Department (HOSPITAL_COMMUNITY): Payer: Medicare Other

## 2015-09-02 ENCOUNTER — Emergency Department (HOSPITAL_COMMUNITY)
Admission: EM | Admit: 2015-09-02 | Discharge: 2015-09-03 | Disposition: A | Discharge status: Home / Self Care | Payer: Medicare Other | Attending: Emergency Medicine | Admitting: Emergency Medicine

## 2015-09-02 DIAGNOSIS — I10 Essential (primary) hypertension: Secondary | ICD-10-CM | POA: Diagnosis not present

## 2015-09-02 DIAGNOSIS — Z79899 Other long term (current) drug therapy: Secondary | ICD-10-CM | POA: Diagnosis not present

## 2015-09-02 DIAGNOSIS — Z87891 Personal history of nicotine dependence: Secondary | ICD-10-CM | POA: Insufficient documentation

## 2015-09-02 DIAGNOSIS — Y658 Other specified misadventures during surgical and medical care: Secondary | ICD-10-CM | POA: Diagnosis not present

## 2015-09-02 DIAGNOSIS — Z7982 Long term (current) use of aspirin: Secondary | ICD-10-CM | POA: Insufficient documentation

## 2015-09-02 DIAGNOSIS — R531 Weakness: Secondary | ICD-10-CM | POA: Diagnosis not present

## 2015-09-02 DIAGNOSIS — M1990 Unspecified osteoarthritis, unspecified site: Secondary | ICD-10-CM | POA: Insufficient documentation

## 2015-09-02 DIAGNOSIS — R000 Tachycardia, unspecified: Secondary | ICD-10-CM | POA: Diagnosis not present

## 2015-09-02 DIAGNOSIS — Z7951 Long term (current) use of inhaled steroids: Secondary | ICD-10-CM | POA: Insufficient documentation

## 2015-09-02 DIAGNOSIS — Z7901 Long term (current) use of anticoagulants: Secondary | ICD-10-CM | POA: Insufficient documentation

## 2015-09-02 DIAGNOSIS — R509 Fever, unspecified: Secondary | ICD-10-CM | POA: Diagnosis present

## 2015-09-02 DIAGNOSIS — D72828 Other elevated white blood cell count: Secondary | ICD-10-CM | POA: Diagnosis not present

## 2015-09-02 DIAGNOSIS — Z794 Long term (current) use of insulin: Secondary | ICD-10-CM | POA: Insufficient documentation

## 2015-09-02 DIAGNOSIS — E119 Type 2 diabetes mellitus without complications: Secondary | ICD-10-CM | POA: Insufficient documentation

## 2015-09-02 DIAGNOSIS — R300 Dysuria: Secondary | ICD-10-CM | POA: Insufficient documentation

## 2015-09-02 DIAGNOSIS — T814XXD Infection following a procedure, subsequent encounter: Secondary | ICD-10-CM | POA: Diagnosis not present

## 2015-09-02 DIAGNOSIS — J449 Chronic obstructive pulmonary disease, unspecified: Secondary | ICD-10-CM | POA: Diagnosis not present

## 2015-09-02 DIAGNOSIS — I739 Peripheral vascular disease, unspecified: Secondary | ICD-10-CM | POA: Diagnosis not present

## 2015-09-02 DIAGNOSIS — Z8709 Personal history of other diseases of the respiratory system: Secondary | ICD-10-CM | POA: Insufficient documentation

## 2015-09-02 DIAGNOSIS — R399 Unspecified symptoms and signs involving the genitourinary system: Secondary | ICD-10-CM | POA: Diagnosis not present

## 2015-09-02 DIAGNOSIS — M96841 Postprocedural hematoma of a musculoskeletal structure following other procedure: Secondary | ICD-10-CM | POA: Diagnosis not present

## 2015-09-02 DIAGNOSIS — Z87442 Personal history of urinary calculi: Secondary | ICD-10-CM | POA: Insufficient documentation

## 2015-09-02 DIAGNOSIS — T888XXD Other specified complications of surgical and medical care, not elsewhere classified, subsequent encounter: Secondary | ICD-10-CM

## 2015-09-02 LAB — URINE MICROSCOPIC-ADD ON

## 2015-09-02 LAB — COMPREHENSIVE METABOLIC PANEL
ALBUMIN: 3.7 g/dL (ref 3.5–5.0)
ALT: 24 U/L (ref 17–63)
AST: 25 U/L (ref 15–41)
Alkaline Phosphatase: 97 U/L (ref 38–126)
Anion gap: 12 (ref 5–15)
BILIRUBIN TOTAL: 0.7 mg/dL (ref 0.3–1.2)
BUN: 17 mg/dL (ref 6–20)
CHLORIDE: 99 mmol/L — AB (ref 101–111)
CO2: 23 mmol/L (ref 22–32)
CREATININE: 1.29 mg/dL — AB (ref 0.61–1.24)
Calcium: 9.6 mg/dL (ref 8.9–10.3)
GFR calc Af Amer: >60 mL/min (ref >60)
GFR, EST NON AFRICAN AMERICAN: 56 mL/min — AB (ref >60)
GLUCOSE: 130 mg/dL — AB (ref 65–99)
POTASSIUM: 4.3 mmol/L (ref 3.5–5.1)
Sodium: 134 mmol/L — ABNORMAL LOW (ref 135–145)
TOTAL PROTEIN: 7.8 g/dL (ref 6.5–8.1)

## 2015-09-02 LAB — CBC WITH DIFFERENTIAL/PLATELET
Basophils Absolute: 0 10*3/uL (ref 0.0–0.1)
Basophils Relative: 0 %
Eosinophils Absolute: 0 10*3/uL (ref 0.0–0.7)
Eosinophils Relative: 0 %
HCT: 41.4 % (ref 39.0–52.0)
HEMOGLOBIN: 13.9 g/dL (ref 13.0–17.0)
LYMPHS ABS: 1.3 10*3/uL (ref 0.7–4.0)
LYMPHS PCT: 9 %
MCH: 32 pg (ref 26.0–34.0)
MCHC: 33.6 g/dL (ref 30.0–36.0)
MCV: 95.4 fL (ref 78.0–100.0)
MONOS PCT: 11 %
Monocytes Absolute: 1.6 10*3/uL — ABNORMAL HIGH (ref 0.1–1.0)
NEUTROS PCT: 80 %
Neutro Abs: 12.4 10*3/uL — ABNORMAL HIGH (ref 1.7–7.7)
Platelets: 216 10*3/uL (ref 150–400)
RBC: 4.34 MIL/uL (ref 4.22–5.81)
RDW: 13.5 % (ref 11.5–15.5)
WBC: 15.4 10*3/uL — AB (ref 4.0–10.5)

## 2015-09-02 LAB — URINALYSIS, ROUTINE W REFLEX MICROSCOPIC
BILIRUBIN URINE: NEGATIVE
HGB URINE DIPSTICK: NEGATIVE
Ketones, ur: 15 mg/dL — AB
Leukocytes, UA: NEGATIVE
Nitrite: NEGATIVE
PH: 5.5 (ref 5.0–8.0)
Protein, ur: NEGATIVE mg/dL
SPECIFIC GRAVITY, URINE: 1.03 (ref 1.005–1.030)

## 2015-09-02 LAB — I-STAT BETA HCG BLOOD, ED (MC, WL, AP ONLY)

## 2015-09-02 LAB — I-STAT CG4 LACTIC ACID, ED
Lactic Acid, Venous: 1.77 mmol/L (ref 0.5–2.0)
Lactic Acid, Venous: 1.89 mmol/L (ref 0.5–2.0)

## 2015-09-02 MED ORDER — DOXYCYCLINE HYCLATE 100 MG PO TABS
100.0000 mg | ORAL_TABLET | Freq: Once | ORAL | Status: AC
  Administered 2015-09-03: 100 mg via ORAL
  Filled 2015-09-02: qty 1

## 2015-09-02 NOTE — Telephone Encounter (Signed)
Phone call from pt's wife.  Stated the pt. Started feeling weak, and had decreased appetite 2 days ago.  Also, reported urinary burning, frequency, and incontinence. Stated temp. 99.7, P. 104, BP 132/77 on 6/6.  Stated that the pt. Felt better yesterday.  Reported today, he is feeling weak again.  Temp 98.1, P. 116- 125, and O2 is 95%.  Reported that the right lower leg started draining clear fluid again.  Stated the pt. Has been washing the incisions with soap/ water daily, and questioned about him getting in the bathtub.  Stated that he did take a bath recently.  Advised not to sit in tub of water;  Recommended to shower or sponge bathe only.  Recommended to have PCP contacted today about urinary burning/frequency, and fever.  Pt. Has f/u with Dr. Bridgett Larsson tomorrow.  Wife verb. Understanding, and stated she will call the PCP now.

## 2015-09-02 NOTE — ED Notes (Signed)
Pt here with c/o dysuria and urinary incontience since Tuesday and low grade fever. He was seen at an Urgent Care in [facility] for these symptoms but sent here today due to WBC elevation, tachycardia and hyerglycemia. Pt denies current pain. Recent leg surgery in April.

## 2015-09-02 NOTE — ED Notes (Signed)
The pt had rt leg surgwery may 29th  Since Tuesday he has had  A sl temp no appetite.  He was seen at ucc today and sent here.  Pt alert no distress

## 2015-09-02 NOTE — Telephone Encounter (Signed)
Pt's wife called and reported she took the pt. To Urgent Care.  Stated that the MD at Urgent Care reported the pt. Has elevated WBC, and elevated Glucose, and "fever" in the right leg.  Was directed to take the pt. To the ER.  Called Triage nurse at [facility] ER; advised of pt's symptoms, and that he is coming to the ER by private vehicle.  Verb. Understanding.

## 2015-09-02 NOTE — Progress Notes (Signed)
    Postoperative Visit   History of Present Illness  Terry Munoz is a 65 y.o. (07/31/1950) male who presents for postoperative follow-up for:   1. Thrombectomy right femoropopliteal artery 2. Thrombectomy right anterior tibial artery  3. Thrombectomy right tibioperoneal trunk  4. Endarterectomy right anterior tibial artery  5. Endarterectomy right tibioperoneal trunk  6. Bovine patch angioplasty right popliteal artery 7. Right calf four-compartment fasciotomies 8. Right common femoral artery cannulation under ultrasound guidance 9. Right leg runoff (Date: 07/24/15).  The patient's wounds are nearly healed. The patient notes resolution of lower extremity symptoms. The patient is ble to complete their activities of daily living. The patient's current symptoms are: none.  Drainage from lateral incision has resolved.  For VQI Use Only PRE-ADM LIVING: Home  AMB STATUS: Ambulatory with Assistance   Physical Examination  Filed Vitals:   09/03/15 1202  BP: 130/82  Pulse: 103  Height: 5\' 8"  (1.727 m)  Weight: 156 lb (70.761 kg)  SpO2: 97%    RLE: Medial incision is healed, small scabs still attached; lateral incision is healed, small scabs still attached   Medical Decision Making  Terry Munoz is a 65 y.o. ([birth year]) male who presents s/p TE R leg, EA R ATA and TPT, BPA R BK pop, R 4-compartment fasciotomy.   Ok to scrub R leg incisions at this point.    Check ABI in 3 months  Thank you for allowing [place] to participate in this patient's care.  Adele Barthel, MD [facility] [place] Office: [phone] Pager: [phone]

## 2015-09-02 NOTE — ED Notes (Signed)
Pt wife informed this RN that pt is taking his pain medication from home for pain to his leg.

## 2015-09-02 NOTE — ED Provider Notes (Signed)
CSN: [id number]     Arrival date & time 09/02/15  1755 History  By signing my name below, I, Dora Sims, attest that this documentation has been prepared under the direction and in the presence of physician practitioner, Blanchie Dessert, MD. Electronically Signed: Dora Sims, Scribe. 09/02/2015. 11:38 PM.    Chief Complaint  Patient presents with  . Dysuria    The history is provided by the patient. No language interpreter was used.     HPI Comments: Terry Munoz is a 65 y.o. male who presents to the Emergency Department complaining of sudden onset, constant, improving, malaise for the last 3 days. Pt endorses associated fever, weakness, decreased appetite, intermittent dysuria and urgency that has resolved, and right lower leg pain due to two recent surgical incisions from a right femoral-popliteal bypass graft performed on 07/24/15. Pt notes that his right lower leg has become increasingly red and warm over the last 3 days. He reports that his urinary symptoms felt like a kidney stone. He states that he experienced drainage from his right lower leg while in the waiting room of the ER today and his right lower leg has not been in pain since. Pt reports that he was seen at Urgent Care today for all of his symptoms; his urinalysis came back normal and his WBC count was elevated. Pt states that his blood sugar was elevated in the 200's today. He states that he has been drinking fluids normally since onset. He was using Augmentin for his right lower leg since surgery and finished the prescription 3 weeks ago. Pt denies cough, congestion, SOB, chest pain, nausea, vomiting, abdominal pain, pain with bowel movements, or any other associated symptoms. Dr. Bridgett Larsson performed pt's recent leg surgery, he has an appointment scheduled with him for tomorrow to discuss his recent symptoms.   Past Medical History  Diagnosis Date  . Hypertension   . COPD (chronic obstructive pulmonary disease) ([place])   .  Otitis media   . Sinusitis   . Insomnia   . Osteoarthritis   . Mouth ulcers   . Kidney stones   . Hyponatremia 03/2014  . Peripheral vascular disease ([place])   . 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 Dental surgery    . Hernia repair    . Lithotomy    . Neck surgery    . Thrombectomy  05/02/2014  . Popliteal artery angioplasty Right 05/02/2014    to mid sfa  . Lower extremity angiogram N/A 05/01/2014    Procedure: LOWER EXTREMITY ANGIOGRAM;  Surgeon: Laverda Page, MD;  Location: [facility] CATH LAB;  Service: Cardiovascular;  Laterality: N/A;  . Femoral-popliteal bypass graft Right 07/24/2015    Procedure: Thrombectomy Right Femoral-Popliteal Artery; Thrombectomy Right Anterior-Tibial Artery, Thrombectomy Right Tibial-Peroneal Trunk; Bovine Patch Angioplasty Right Popliteal Artery; Endarterectomy Right Anterior Tibial Artery and Right Tibial-Peroneal Trunk; Right Leg Angiogram, Four Compartment Fasciotomy Right Lower Leg;  Surgeon: Conrad Rushmore, MD;  Location: [facility] OR;  Service: Vascular;  L   No family history on file. Social History  Substance Use Topics  . Smoking status: Former Smoker -- 2.00 packs/day for 45 years    Types: Cigarettes    Quit date: 04/27/2014  . Smokeless tobacco: Never Used  . Alcohol Use: 14.4 oz/week    24 Cans of beer per week    Review of Systems  A complete 10 system review of systems was  obtained and all systems are negative except as noted in the HPI and PMH.   Allergies  Sulfa antibiotics  Home Medications   Prior to Admission medications   Medication Sig Start Date End Date Taking? Authorizing Provider  albuterol (PROVENTIL HFA;VENTOLIN HFA) 108 (90 Base) MCG/ACT inhaler Inhale 1-2 puffs into the lungs every 6 (six) hours as needed for wheezing or shortness of breath.    Historical Provider, MD  albuterol (PROVENTIL) (2.5 MG/3ML) 0.083% nebulizer solution Take 2.5 mg  by nebulization every 6 (six) hours as needed for wheezing or shortness of breath.    Historical Provider, MD  amitriptyline (ELAVIL) 50 MG tablet Take 50 mg by mouth at bedtime.    Historical Provider, MD  amLODipine (NORVASC) 5 MG tablet Take 5 mg by mouth daily.    Historical Provider, MD  amoxicillin-clavulanate (AUGMENTIN) 875-125 MG tablet Take 1 tablet by mouth every 12 (twelve) hours. 08/06/15   Conrad Cloverdale, MD  aspirin 325 MG tablet Take 325 mg by mouth daily.    Historical Provider, MD  atorvastatin (LIPITOR) 40 MG tablet Take 40 mg by mouth daily.    Historical Provider, MD  budesonide-formoterol (SYMBICORT) 160-4.5 MCG/ACT inhaler Inhale 2 puffs into the lungs 2 (two) times daily.    Historical Provider, MD  clopidogrel (PLAVIX) 75 MG tablet Take 1 tablet (75 mg total) by mouth daily. 07/28/15   [name], PA-C  folic acid (FOLVITE) 1 MG tablet Take 1 tablet (1 mg total) by mouth daily. 04/24/14   Geradine Girt, DO  glimepiride (AMARYL) 4 MG tablet Take 4 mg by mouth 2 (two) times daily.    Historical Provider, MD  insulin glargine (LANTUS) 100 UNIT/ML injection Inject 0.3 mLs (30 Units total) into the skin daily. Patient taking differently: Inject 34 Units into the skin daily.  04/24/14   Geradine Girt, DO  irbesartan (AVAPRO) 75 MG tablet Take 1 tablet (75 mg total) by mouth daily. 04/24/14   Geradine Girt, DO  JARDIANCE 10 MG TABS tablet Take 10 mg by mouth daily. 06/19/15   Historical Provider, MD  metoprolol tartrate (LOPRESSOR) 25 MG tablet Take 0.5 tablets (12.5 mg total) by mouth 2 (two) times daily. Patient not taking: Reported on 07/24/2015 04/24/14   Geradine Girt, DO  omeprazole (PRILOSEC) 20 MG capsule Take 20 mg by mouth daily as needed (heartburn).    Historical Provider, MD  Oxycodone HCl 10 MG TABS Take 1 tablet (10 mg total) by mouth every 4 (four) hours as needed. 07/28/15   [name], PA-C  potassium chloride (MICRO-K) 10 MEQ CR capsule Take 10 mEq by mouth  daily.    Historical Provider, MD  sucralfate (CARAFATE) 1 G tablet Take 1 tablet (1 g total) by mouth 4 (four) times daily -  with meals and at bedtime. 02/22/13   Virgel Manifold, MD  thiamine 100 MG tablet Take 1 tablet (100 mg total) by mouth daily. 04/24/14   Jessica U Vann, DO   BP 150/84 mmHg  Pulse 68  Temp(Src) 98.4 F (36.9 C) (Oral)  Resp 16  SpO2 97% Physical Exam  Constitutional: He is oriented to person, place, and time. He appears well-developed and well-nourished. No distress.  HENT:  Head: Normocephalic and atraumatic.  Mouth/Throat: Mucous membranes are dry.  Eyes: Conjunctivae and EOM are normal.  Neck: Neck supple. No tracheal deviation present.  Cardiovascular: Normal rate, normal heart sounds and intact distal pulses.  Exam reveals no friction  rub.   No murmur heard. Pulmonary/Chest: Effort normal. No respiratory distress. He has no wheezes.  Abdominal: Soft. He exhibits no distension. There is no tenderness. There is no rebound and no guarding.  No cva tenderness  Musculoskeletal: Normal range of motion.  Medial right calf surgical incision appears to be healing, no drainage or tenderness Lateral right calf surgical incision no longer draining, but dried blood around distal portion of the surgical wound, non-tender, no erythema or warmth currently. Right foot is warm, pink, less than 3 second capillary refill.  Neurological: He is alert and oriented to person, place, and time.  Skin: Skin is warm and dry.  Psychiatric: He has a normal mood and affect. His behavior is normal.  Nursing note and vitals reviewed.   ED Course  Procedures (including critical care time)  DIAGNOSTIC STUDIES: Oxygen Saturation is 97% on RA, normal by my interpretation.    COORDINATION OF CARE: 11:38 PM Discussed treatment plan with pt at bedside and pt agreed to plan.  Labs Review Labs Reviewed  COMPREHENSIVE METABOLIC PANEL - Abnormal; Notable for the following:    Sodium 134  (*)    Chloride 99 (*)    Glucose, Bld 130 (*)    Creatinine, Ser 1.29 (*)    GFR calc non Af Amer 56 (*)    All other components within normal limits  CBC WITH DIFFERENTIAL/PLATELET - Abnormal; Notable for the following:    WBC 15.4 (*)    Neutro Abs 12.4 (*)    Monocytes Absolute 1.6 (*)    All other components within normal limits  URINALYSIS, ROUTINE W REFLEX MICROSCOPIC (NOT AT [facility]) - Abnormal; Notable for the following:    Glucose, UA >1000 (*)    Ketones, ur 15 (*)    All other components within normal limits  URINE MICROSCOPIC-ADD ON - Abnormal; Notable for the following:    Squamous Epithelial / LPF 0-5 (*)    Bacteria, UA RARE (*)    All other components within normal limits  URINE CULTURE  I-STAT BETA HCG BLOOD, ED ([facility], [facility], [facility] ONLY)  I-STAT CG4 LACTIC ACID, ED  I-STAT CG4 LACTIC ACID, ED    Imaging Review Dg Chest 2 View  09/02/2015  CLINICAL DATA:  Weakness for 3 days with fevers EXAM: CHEST  2 VIEW COMPARISON:  07/24/2015 FINDINGS: Cardiac shadow is within normal limits. The lungs are hyperinflated consistent with COPD. Calcified granuloma is noted in the right base stable from the prior exam. No focal infiltrate or sizable effusion is seen. IMPRESSION: No acute abnormality noted. COPD. Electronically Signed   By: Inez Catalina M.D.   On: 09/02/2015 18:38   I have personally reviewed and evaluated these images and lab results as part of my medical decision-making.   EKG Interpretation None      MDM   Final diagnoses:  Infected hematoma following procedure, subsequent encounter   Patient is a 65 year old male with a history of peripheral vascular disease status post femoral-popliteal bypass grafting and thrombectomy with bilateral fasciotomies at the end of April. Patient completed a course of Augmentin approximate 3 weeks ago after concern for wound infection. He states he was doing well until 3 days ago when he started to have general malaise, fever, anorexia  with mild erythema and warmth to his right lower extremity. Also patient had tended intermittent urinary frequency with occasional dysuria which is now resolved.   Patient is mildly tachycardic but otherwise well-appearing. Labs and exam with no signs  of sepsis. Lactic acidosis with normal limits. Patient does have a leukocytosis of 15,000. Wound on the leg is well appearing however when looking back through Dr. Lianne Moris prior office notes he had a large hematoma over the left lateral fasciotomy wound with concern that it could drain. Concerned that that may have gotten infected. Drainage has seemed to improve patient's symptoms and condition of his leg. Upon evaluation of his leg currently there is no erythema or drainage. He was started on doxycycline to cover for infection. There are no evidence for pneumonia, UTI and renal function appears slightly elevated which is most likely from dehydration. He has an appointment with his vascular surgeon tomorrow morning at 11:30 AM.  I personally performed the services described in this documentation, which was scribed in my presence.  The recorded information has been reviewed and considered.   Blanchie Dessert, MD 09/03/15 [phone]

## 2015-09-03 ENCOUNTER — Ambulatory Visit (INDEPENDENT_AMBULATORY_CARE_PROVIDER_SITE_OTHER): Payer: Medicare Other | Admitting: Vascular Surgery

## 2015-09-03 ENCOUNTER — Encounter: Payer: Self-pay | Admitting: Vascular Surgery

## 2015-09-03 VITALS — BP 130/82 | HR 103 | Ht 68.0 in | Wt 156.0 lb

## 2015-09-03 DIAGNOSIS — I70201 Unspecified atherosclerosis of native arteries of extremities, right leg: Secondary | ICD-10-CM

## 2015-09-03 DIAGNOSIS — I743 Embolism and thrombosis of arteries of the lower extremities: Secondary | ICD-10-CM

## 2015-09-03 DIAGNOSIS — M96841 Postprocedural hematoma of a musculoskeletal structure following other procedure: Secondary | ICD-10-CM | POA: Diagnosis not present

## 2015-09-03 MED ORDER — CLOPIDOGREL BISULFATE 75 MG PO TABS
75.0000 mg | ORAL_TABLET | Freq: Every day | ORAL | Status: DC
Start: 2015-09-03 — End: 2016-12-01

## 2015-09-03 MED ORDER — DOXYCYCLINE HYCLATE 100 MG PO CAPS: 100.0000 mg | ORAL_CAPSULE | Freq: Two times a day (BID) | ORAL | Status: DC

## 2015-09-03 NOTE — ED Notes (Signed)
The pt has a good posterior tibial and dorsalis pedis by doppler

## 2015-09-04 LAB — URINE CULTURE

## 2015-09-05 ENCOUNTER — Telehealth (HOSPITAL_BASED_OUTPATIENT_CLINIC_OR_DEPARTMENT_OTHER): Payer: Self-pay

## 2015-09-05 NOTE — Progress Notes (Signed)
ED Antimicrobial Stewardship Positive Culture Follow Up   Terry Munoz is an 65 y.o. male who presented to [facility] on 09/02/2015 with a chief complaint of  Chief Complaint  Patient presents with  . Dysuria    Recent Results (from the past 720 hour(s))  Urine culture     Status: Abnormal   Collection Time: 09/02/15  7:31 PM  Result Value Ref Range Status   Specimen Description URINE, CLEAN CATCH  Final   Special Requests NONE  Final   Culture >=100,000 COLONIES/mL PROVIDENCIA RETTGERI (A)  Final   Report Status 09/04/2015 FINAL  Final   Organism ID, Bacteria PROVIDENCIA RETTGERI (A)  Final      Susceptibility   Providencia rettgeri - MIC*    AMPICILLIN <=2 RESISTANT Resistant     CEFAZOLIN >=64 RESISTANT Resistant     CEFTRIAXONE <=1 SENSITIVE Sensitive     CIPROFLOXACIN <=0.25 SENSITIVE Sensitive     GENTAMICIN <=1 SENSITIVE Sensitive     IMIPENEM 1 SENSITIVE Sensitive     NITROFURANTOIN 256 RESISTANT Resistant     TRIMETH/SULFA <=20 SENSITIVE Sensitive     AMPICILLIN/SULBACTAM <=2 SENSITIVE Sensitive     PIP/TAZO <=4 SENSITIVE Sensitive     * >=100,000 COLONIES/mL PROVIDENCIA RETTGERI     [x]  Treated with doxycycline, organism resistant to prescribed antimicrobial  New antibiotic prescription: Stop doxycycline. Take Cipro 500 mg PO BID x 7 days.  ED Provider: Arlean Hopping, PA-C  Gwendolen Hewlett L. Nicole Kindred, PharmD PGY2 Infectious Diseases Pharmacy Resident Pager: [phone] 09/05/2015 10:14 AM

## 2015-09-05 NOTE — Telephone Encounter (Signed)
Post ED Visit - Positive Culture Follow-up: Successful Patient Follow-Up  Culture assessed and recommendations reviewed by: []  Elenor Quinones, Pharm.D. []  Heide Guile, Pharm.D., BCPS []  Parks Neptune, Pharm.D. []  Alycia Rossetti, Pharm.D., BCPS []  [place], Pharm.D., BCPS, AAHIVP []  Legrand Como, Pharm.D., BCPS, AAHIVP [x]  Milus Glazier, Pharm.D. []  Stephens November, Pharm.D.  Positive urine culture  []  Patient discharged without antimicrobial prescription and treatment is now indicated [x]  Organism is resistant to prescribed ED discharge antimicrobial []  Patient with positive blood cultures  Changes discussed with ED provider: Arlean Hopping PA-C New antibiotic prescription Cipro 500 mg PO BID x 7 days Called to [facility]  Contacted patient, date 09/05/15, time [place], Carolynn Comment 09/05/2015, 10:43 AM

## 2015-09-07 DIAGNOSIS — M159 Polyosteoarthritis, unspecified: Secondary | ICD-10-CM | POA: Diagnosis not present

## 2015-09-07 DIAGNOSIS — I10 Essential (primary) hypertension: Secondary | ICD-10-CM | POA: Diagnosis not present

## 2015-09-07 DIAGNOSIS — E119 Type 2 diabetes mellitus without complications: Secondary | ICD-10-CM | POA: Diagnosis not present

## 2015-09-07 DIAGNOSIS — Z48812 Encounter for surgical aftercare following surgery on the circulatory system: Secondary | ICD-10-CM | POA: Diagnosis not present

## 2015-09-07 DIAGNOSIS — J449 Chronic obstructive pulmonary disease, unspecified: Secondary | ICD-10-CM | POA: Diagnosis not present

## 2015-09-07 DIAGNOSIS — I739 Peripheral vascular disease, unspecified: Secondary | ICD-10-CM | POA: Diagnosis not present

## 2015-09-08 DIAGNOSIS — M159 Polyosteoarthritis, unspecified: Secondary | ICD-10-CM | POA: Diagnosis not present

## 2015-09-08 DIAGNOSIS — Z48812 Encounter for surgical aftercare following surgery on the circulatory system: Secondary | ICD-10-CM | POA: Diagnosis not present

## 2015-09-08 DIAGNOSIS — I739 Peripheral vascular disease, unspecified: Secondary | ICD-10-CM | POA: Diagnosis not present

## 2015-09-08 DIAGNOSIS — I10 Essential (primary) hypertension: Secondary | ICD-10-CM | POA: Diagnosis not present

## 2015-09-08 DIAGNOSIS — E119 Type 2 diabetes mellitus without complications: Secondary | ICD-10-CM | POA: Diagnosis not present

## 2015-09-08 DIAGNOSIS — J449 Chronic obstructive pulmonary disease, unspecified: Secondary | ICD-10-CM | POA: Diagnosis not present

## 2015-09-14 DIAGNOSIS — I739 Peripheral vascular disease, unspecified: Secondary | ICD-10-CM | POA: Diagnosis not present

## 2015-09-14 DIAGNOSIS — J449 Chronic obstructive pulmonary disease, unspecified: Secondary | ICD-10-CM | POA: Diagnosis not present

## 2015-09-14 DIAGNOSIS — Z48812 Encounter for surgical aftercare following surgery on the circulatory system: Secondary | ICD-10-CM | POA: Diagnosis not present

## 2015-09-14 DIAGNOSIS — E119 Type 2 diabetes mellitus without complications: Secondary | ICD-10-CM | POA: Diagnosis not present

## 2015-09-14 DIAGNOSIS — I10 Essential (primary) hypertension: Secondary | ICD-10-CM | POA: Diagnosis not present

## 2015-09-14 DIAGNOSIS — M159 Polyosteoarthritis, unspecified: Secondary | ICD-10-CM | POA: Diagnosis not present

## 2015-09-23 DIAGNOSIS — I10 Essential (primary) hypertension: Secondary | ICD-10-CM | POA: Diagnosis not present

## 2015-09-23 DIAGNOSIS — J449 Chronic obstructive pulmonary disease, unspecified: Secondary | ICD-10-CM | POA: Diagnosis not present

## 2015-09-23 DIAGNOSIS — M159 Polyosteoarthritis, unspecified: Secondary | ICD-10-CM | POA: Diagnosis not present

## 2015-09-23 DIAGNOSIS — I739 Peripheral vascular disease, unspecified: Secondary | ICD-10-CM | POA: Diagnosis not present

## 2015-09-23 DIAGNOSIS — Z48812 Encounter for surgical aftercare following surgery on the circulatory system: Secondary | ICD-10-CM | POA: Diagnosis not present

## 2015-09-23 DIAGNOSIS — E119 Type 2 diabetes mellitus without complications: Secondary | ICD-10-CM | POA: Diagnosis not present

## 2015-10-14 DIAGNOSIS — Z6824 Body mass index (BMI) 24.0-24.9, adult: Secondary | ICD-10-CM | POA: Diagnosis not present

## 2015-10-14 DIAGNOSIS — E1159 Type 2 diabetes mellitus with other circulatory complications: Secondary | ICD-10-CM | POA: Diagnosis not present

## 2015-10-14 DIAGNOSIS — I10 Essential (primary) hypertension: Secondary | ICD-10-CM | POA: Diagnosis not present

## 2015-10-23 DIAGNOSIS — R040 Epistaxis: Secondary | ICD-10-CM | POA: Diagnosis not present

## 2015-10-27 DIAGNOSIS — C44311 Basal cell carcinoma of skin of nose: Secondary | ICD-10-CM | POA: Diagnosis not present

## 2015-12-01 DIAGNOSIS — I739 Peripheral vascular disease, unspecified: Secondary | ICD-10-CM | POA: Diagnosis not present

## 2015-12-01 DIAGNOSIS — E1151 Type 2 diabetes mellitus with diabetic peripheral angiopathy without gangrene: Secondary | ICD-10-CM | POA: Diagnosis not present

## 2015-12-01 DIAGNOSIS — I798 Other disorders of arteries, arterioles and capillaries in diseases classified elsewhere: Secondary | ICD-10-CM | POA: Diagnosis not present

## 2015-12-01 DIAGNOSIS — J441 Chronic obstructive pulmonary disease with (acute) exacerbation: Secondary | ICD-10-CM | POA: Diagnosis not present

## 2015-12-01 DIAGNOSIS — J0110 Acute frontal sinusitis, unspecified: Secondary | ICD-10-CM | POA: Diagnosis not present

## 2015-12-13 ENCOUNTER — Encounter: Payer: Self-pay | Admitting: Vascular Surgery

## 2015-12-14 ENCOUNTER — Other Ambulatory Visit: Payer: Self-pay | Admitting: *Deleted

## 2015-12-14 DIAGNOSIS — I739 Peripheral vascular disease, unspecified: Secondary | ICD-10-CM

## 2015-12-14 DIAGNOSIS — Z48812 Encounter for surgical aftercare following surgery on the circulatory system: Secondary | ICD-10-CM

## 2015-12-16 NOTE — Progress Notes (Signed)
Established Critical Limb Ischemia Patient  History of Present Illness  Terry Munoz is a 65 y.o. (1950-04-28) male who presents with chief complaint: routine follow up..    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 Thrombectomy right femoropopliteal artery 2. Thrombectomy right anterior tibial artery  3. Thrombectomy right tibioperoneal trunk  4. Endarterectomy right anterior tibial artery  5. Endarterectomy right tibioperoneal trunk  6. Bovine patch angioplasty right popliteal artery 7. Right calf four-compartment fasciotomies 8. Right common femoral artery cannulation under ultrasound guidance 9. Right leg runoff.  The patient has no rest pain and wounds include: none.  The patient notes symptoms have none progressed.  The patient's treatment regimen currently included: maximal medical management.   The patient's PMH, PSH, SH, and FamHx are unchanged from 09/02/15 .  Current Outpatient Prescriptions  Medication Sig Dispense Refill  . albuterol (PROVENTIL HFA;VENTOLIN HFA) 108 (90 Base) MCG/ACT inhaler Inhale 1-2 puffs into the lungs every 6 (six) hours as needed for wheezing or shortness of breath.    Marland Kitchen albuterol (PROVENTIL) (2.5 MG/3ML) 0.083% nebulizer solution Take 2.5 mg by nebulization every 6 (six) hours as needed for wheezing or shortness of breath.    Marland Kitchen amitriptyline (ELAVIL) 50 MG tablet Take 50 mg by mouth at bedtime.    Marland Kitchen amLODipine (NORVASC) 5 MG tablet Take 5 mg by mouth daily.    Marland Kitchen amoxicillin-clavulanate (AUGMENTIN) 875-125 MG tablet Take 1 tablet by mouth every 12 (twelve) hours. 20 tablet 0  . aspirin 325 MG tablet Take 325 mg by mouth daily.    Marland Kitchen atorvastatin (LIPITOR) 40 MG tablet Take 40 mg by mouth daily.    . budesonide-formoterol (SYMBICORT) 160-4.5 MCG/ACT inhaler Inhale 2 puffs into the lungs 2 (two) times daily.    . clopidogrel (PLAVIX) 75 MG tablet Take 1 tablet (75 mg total) by  mouth daily. 90 tablet 11  . doxycycline (VIBRAMYCIN) 100 MG capsule Take 1 capsule (100 mg total) by mouth 2 (two) times daily. 20 capsule 0  . folic acid (FOLVITE) 1 MG tablet Take 1 tablet (1 mg total) by mouth daily.    Marland Kitchen glimepiride (AMARYL) 4 MG tablet Take 4 mg by mouth 2 (two) times daily.    . insulin glargine (LANTUS) 100 UNIT/ML injection Inject 0.3 mLs (30 Units total) into the skin daily. (Patient taking differently: Inject 34 Units into the skin daily. ) 10 mL 11  . irbesartan (AVAPRO) 75 MG tablet Take 1 tablet (75 mg total) by mouth daily. 30 tablet 0  . JARDIANCE 10 MG TABS tablet Take 10 mg by mouth daily.    . metoprolol tartrate (LOPRESSOR) 25 MG tablet Take 0.5 tablets (12.5 mg total) by mouth 2 (two) times daily. (Patient not taking: Reported on 07/24/2015) 60 tablet 0  . omeprazole (PRILOSEC) 20 MG capsule Take 20 mg by mouth daily as needed (heartburn).    . Oxycodone HCl 10 MG TABS Take 1 tablet (10 mg total) by mouth every 4 (four) hours as needed. 30 tablet 0  . potassium chloride (MICRO-K) 10 MEQ CR capsule Take 10 mEq by mouth daily.    . sucralfate (CARAFATE) 1 G tablet Take 1 tablet (1 g total) by mouth 4 (four) times daily -  with meals and at bedtime. 30 tablet 0  . thiamine 100 MG tablet Take 1 tablet (100 mg total) by mouth daily.     No current facility-administered medications for  this visit.     Allergies  Allergen Reactions  . Sulfa Antibiotics Hives    On ROS today: no residual wounds, swelling mostly resolved   Physical Examination  Vitals:   12/17/15 1540  BP: 140/83  Pulse: 87  Resp: 20  Temp: 97.8 F (36.6 C)  TempSrc: Oral  SpO2: 94%  Weight: 164 lb 4.8 oz (74.5 kg)  Height: 5\' 8"  (1.727 m)   Body mass index is 24.98 kg/m.  General: A&O x 3, WDWN  Eyes: PERRLA, EOMI  Pulmonary: Sym exp, good air movt, CTAB, no rales, rhonchi, & wheezing  Cardiac: RRR, Nl S1, S2, no Murmurs, rubs or gallops  Vascular: Vessel Right Left    Radial Palpable Palpable  Brachial Palpable Palpable  Carotid Palpable, without bruit Palpable, without bruit  Aorta Not palpable N/A  Femoral Palpable Palpable  Popliteal Not palpable Not palpable  PT Palpable Palpable  DP Palpable Palpable   Gastrointestinal: soft, NTND, no G/R, no HSM, no masses, no CVAT B  Musculoskeletal: M/S 5/5 throughout , Extremities without ischemic changes , medial and lateral fasciotomy incisions healed, 1+ edema  Neurologic: Pain and light touch intact in extremities , Motor exam as listed above  Non-Invasive Vascular Imaging ABI (Date: 12/16/2015)  R:   ABI: 0.95,   DP: tri  PT: bi  TBI:  0.70  L:   ABI: 1.2,   DP: tri  PT: tri  TBI: 0.85   Medical Decision Making  Terry Munoz is a 65 y.o. 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 Einar Gip patient, so will return him to Dr. Einar Gip for continued surveillance.  I discussed in depth with the patient the nature of atherosclerosis, and emphasized the importance of maximal medical management including strict control of blood pressure, blood glucose, and lipid levels, antiplatelet agents, obtaining regular exercise, and cessation of smoking.    The patient is aware that without maximal medical management the underlying atherosclerotic disease process will progress, limiting the benefit of any interventions. The patient is currently on a statin: Lipitor. The patient is currently on an anti-platelet: Plavix.  Thank you for allowing [place] to participate in this patient's care.   Adele Barthel, MD, FACS [facility] Office: [phone] Pager: [phone]

## 2015-12-17 ENCOUNTER — Ambulatory Visit (HOSPITAL_COMMUNITY)
Admission: RE | Admit: 2015-12-17 | Discharge: 2015-12-17 | Disposition: A | Discharge status: Home / Self Care | Payer: Medicare Other | Source: Ambulatory Visit | Attending: Vascular Surgery | Admitting: Vascular Surgery

## 2015-12-17 ENCOUNTER — Encounter: Payer: Self-pay | Admitting: Vascular Surgery

## 2015-12-17 ENCOUNTER — Ambulatory Visit (INDEPENDENT_AMBULATORY_CARE_PROVIDER_SITE_OTHER): Payer: Medicare Other | Admitting: Vascular Surgery

## 2015-12-17 VITALS — BP 140/83 | HR 87 | Temp 97.8°F | Resp 20 | Ht 68.0 in | Wt 164.3 lb

## 2015-12-17 DIAGNOSIS — Z48812 Encounter for surgical aftercare following surgery on the circulatory system: Secondary | ICD-10-CM | POA: Diagnosis not present

## 2015-12-17 DIAGNOSIS — I743 Embolism and thrombosis of arteries of the lower extremities: Secondary | ICD-10-CM | POA: Diagnosis not present

## 2015-12-17 DIAGNOSIS — I739 Peripheral vascular disease, unspecified: Secondary | ICD-10-CM | POA: Diagnosis not present

## 2015-12-17 DIAGNOSIS — I70201 Unspecified atherosclerosis of native arteries of extremities, right leg: Secondary | ICD-10-CM

## 2015-12-27 DIAGNOSIS — J0101 Acute recurrent maxillary sinusitis: Secondary | ICD-10-CM | POA: Diagnosis not present

## 2016-01-17 DIAGNOSIS — J0111 Acute recurrent frontal sinusitis: Secondary | ICD-10-CM | POA: Diagnosis not present

## 2016-02-08 DIAGNOSIS — R51 Headache: Secondary | ICD-10-CM | POA: Diagnosis not present

## 2016-02-08 DIAGNOSIS — J342 Deviated nasal septum: Secondary | ICD-10-CM | POA: Diagnosis not present

## 2016-02-08 DIAGNOSIS — R519 Headache, unspecified: Secondary | ICD-10-CM | POA: Insufficient documentation

## 2016-02-22 DIAGNOSIS — J069 Acute upper respiratory infection, unspecified: Secondary | ICD-10-CM | POA: Diagnosis not present

## 2016-03-07 DIAGNOSIS — F17200 Nicotine dependence, unspecified, uncomplicated: Secondary | ICD-10-CM | POA: Diagnosis not present

## 2016-03-07 DIAGNOSIS — E1159 Type 2 diabetes mellitus with other circulatory complications: Secondary | ICD-10-CM | POA: Diagnosis not present

## 2016-03-07 DIAGNOSIS — I10 Essential (primary) hypertension: Secondary | ICD-10-CM | POA: Diagnosis not present

## 2016-03-19 DIAGNOSIS — R0781 Pleurodynia: Secondary | ICD-10-CM | POA: Diagnosis not present

## 2016-03-19 DIAGNOSIS — Z7982 Long term (current) use of aspirin: Secondary | ICD-10-CM | POA: Diagnosis not present

## 2016-03-19 DIAGNOSIS — Z794 Long term (current) use of insulin: Secondary | ICD-10-CM | POA: Diagnosis not present

## 2016-03-19 DIAGNOSIS — M546 Pain in thoracic spine: Secondary | ICD-10-CM | POA: Diagnosis not present

## 2016-03-19 DIAGNOSIS — Z79899 Other long term (current) drug therapy: Secondary | ICD-10-CM | POA: Diagnosis not present

## 2016-03-19 DIAGNOSIS — M1990 Unspecified osteoarthritis, unspecified site: Secondary | ICD-10-CM | POA: Diagnosis not present

## 2016-03-19 DIAGNOSIS — W01198A Fall on same level from slipping, tripping and stumbling with subsequent striking against other object, initial encounter: Secondary | ICD-10-CM | POA: Diagnosis not present

## 2016-03-19 DIAGNOSIS — Z7902 Long term (current) use of antithrombotics/antiplatelets: Secondary | ICD-10-CM | POA: Diagnosis not present

## 2016-03-19 DIAGNOSIS — S20212A Contusion of left front wall of thorax, initial encounter: Secondary | ICD-10-CM | POA: Diagnosis not present

## 2016-03-19 DIAGNOSIS — S299XXA Unspecified injury of thorax, initial encounter: Secondary | ICD-10-CM | POA: Diagnosis not present

## 2016-03-19 DIAGNOSIS — S233XXA Sprain of ligaments of thoracic spine, initial encounter: Secondary | ICD-10-CM | POA: Diagnosis not present

## 2016-03-19 DIAGNOSIS — F17200 Nicotine dependence, unspecified, uncomplicated: Secondary | ICD-10-CM | POA: Diagnosis not present

## 2016-04-11 DIAGNOSIS — S299XXA Unspecified injury of thorax, initial encounter: Secondary | ICD-10-CM | POA: Diagnosis not present

## 2016-04-11 DIAGNOSIS — R0781 Pleurodynia: Secondary | ICD-10-CM | POA: Diagnosis not present

## 2016-04-11 DIAGNOSIS — R109 Unspecified abdominal pain: Secondary | ICD-10-CM | POA: Diagnosis not present

## 2016-04-25 DIAGNOSIS — H25813 Combined forms of age-related cataract, bilateral: Secondary | ICD-10-CM | POA: Diagnosis not present

## 2016-04-25 DIAGNOSIS — Z794 Long term (current) use of insulin: Secondary | ICD-10-CM | POA: Diagnosis not present

## 2016-04-25 DIAGNOSIS — E119 Type 2 diabetes mellitus without complications: Secondary | ICD-10-CM | POA: Diagnosis not present

## 2016-04-25 DIAGNOSIS — Z7984 Long term (current) use of oral hypoglycemic drugs: Secondary | ICD-10-CM | POA: Diagnosis not present

## 2016-05-25 DIAGNOSIS — L570 Actinic keratosis: Secondary | ICD-10-CM | POA: Diagnosis not present

## 2016-05-25 DIAGNOSIS — D1801 Hemangioma of skin and subcutaneous tissue: Secondary | ICD-10-CM | POA: Diagnosis not present

## 2016-05-25 DIAGNOSIS — Z85828 Personal history of other malignant neoplasm of skin: Secondary | ICD-10-CM | POA: Diagnosis not present

## 2016-05-25 DIAGNOSIS — L821 Other seborrheic keratosis: Secondary | ICD-10-CM | POA: Diagnosis not present

## 2016-05-25 DIAGNOSIS — L814 Other melanin hyperpigmentation: Secondary | ICD-10-CM | POA: Diagnosis not present

## 2016-06-07 DIAGNOSIS — E1159 Type 2 diabetes mellitus with other circulatory complications: Secondary | ICD-10-CM | POA: Diagnosis not present

## 2016-06-07 DIAGNOSIS — Z6825 Body mass index (BMI) 25.0-25.9, adult: Secondary | ICD-10-CM | POA: Diagnosis not present

## 2016-06-07 DIAGNOSIS — I10 Essential (primary) hypertension: Secondary | ICD-10-CM | POA: Diagnosis not present

## 2016-06-15 DIAGNOSIS — I739 Peripheral vascular disease, unspecified: Secondary | ICD-10-CM | POA: Diagnosis not present

## 2016-06-20 DIAGNOSIS — E118 Type 2 diabetes mellitus with unspecified complications: Secondary | ICD-10-CM | POA: Diagnosis not present

## 2016-06-20 DIAGNOSIS — I739 Peripheral vascular disease, unspecified: Secondary | ICD-10-CM | POA: Diagnosis not present

## 2016-06-20 DIAGNOSIS — E7800 Pure hypercholesterolemia, unspecified: Secondary | ICD-10-CM | POA: Diagnosis not present

## 2016-06-20 DIAGNOSIS — I10 Essential (primary) hypertension: Secondary | ICD-10-CM | POA: Diagnosis not present

## 2016-06-23 DIAGNOSIS — J449 Chronic obstructive pulmonary disease, unspecified: Secondary | ICD-10-CM | POA: Diagnosis not present

## 2016-06-23 DIAGNOSIS — R000 Tachycardia, unspecified: Secondary | ICD-10-CM | POA: Diagnosis not present

## 2016-08-03 DIAGNOSIS — I739 Peripheral vascular disease, unspecified: Secondary | ICD-10-CM | POA: Diagnosis not present

## 2016-08-03 DIAGNOSIS — E7800 Pure hypercholesterolemia, unspecified: Secondary | ICD-10-CM | POA: Diagnosis not present

## 2016-08-03 DIAGNOSIS — I10 Essential (primary) hypertension: Secondary | ICD-10-CM | POA: Diagnosis not present

## 2016-09-06 DIAGNOSIS — E1159 Type 2 diabetes mellitus with other circulatory complications: Secondary | ICD-10-CM | POA: Diagnosis not present

## 2016-09-06 DIAGNOSIS — Z6824 Body mass index (BMI) 24.0-24.9, adult: Secondary | ICD-10-CM | POA: Diagnosis not present

## 2016-09-06 DIAGNOSIS — I10 Essential (primary) hypertension: Secondary | ICD-10-CM | POA: Diagnosis not present

## 2016-11-06 DIAGNOSIS — I739 Peripheral vascular disease, unspecified: Secondary | ICD-10-CM | POA: Diagnosis not present

## 2016-11-06 DIAGNOSIS — F17200 Nicotine dependence, unspecified, uncomplicated: Secondary | ICD-10-CM | POA: Diagnosis not present

## 2016-11-06 DIAGNOSIS — E7800 Pure hypercholesterolemia, unspecified: Secondary | ICD-10-CM | POA: Diagnosis not present

## 2016-11-06 DIAGNOSIS — I10 Essential (primary) hypertension: Secondary | ICD-10-CM | POA: Diagnosis not present

## 2016-12-01 ENCOUNTER — Other Ambulatory Visit: Payer: Self-pay | Admitting: *Deleted

## 2016-12-01 DIAGNOSIS — I70201 Unspecified atherosclerosis of native arteries of extremities, right leg: Secondary | ICD-10-CM

## 2016-12-01 MED ORDER — CLOPIDOGREL BISULFATE 75 MG PO TABS: 75.0000 mg | ORAL_TABLET | Freq: Every day | ORAL | 11 refills | Status: DC

## 2016-12-05 ENCOUNTER — Other Ambulatory Visit: Payer: Self-pay | Admitting: *Deleted

## 2016-12-05 DIAGNOSIS — Z85828 Personal history of other malignant neoplasm of skin: Secondary | ICD-10-CM | POA: Diagnosis not present

## 2016-12-05 DIAGNOSIS — L814 Other melanin hyperpigmentation: Secondary | ICD-10-CM | POA: Diagnosis not present

## 2016-12-05 DIAGNOSIS — D485 Neoplasm of uncertain behavior of skin: Secondary | ICD-10-CM | POA: Diagnosis not present

## 2016-12-05 DIAGNOSIS — L570 Actinic keratosis: Secondary | ICD-10-CM | POA: Diagnosis not present

## 2016-12-05 DIAGNOSIS — L821 Other seborrheic keratosis: Secondary | ICD-10-CM | POA: Diagnosis not present

## 2016-12-05 DIAGNOSIS — D225 Melanocytic nevi of trunk: Secondary | ICD-10-CM | POA: Diagnosis not present

## 2016-12-05 DIAGNOSIS — C4442 Squamous cell carcinoma of skin of scalp and neck: Secondary | ICD-10-CM | POA: Diagnosis not present

## 2016-12-07 DIAGNOSIS — I10 Essential (primary) hypertension: Secondary | ICD-10-CM | POA: Diagnosis not present

## 2016-12-07 DIAGNOSIS — E1159 Type 2 diabetes mellitus with other circulatory complications: Secondary | ICD-10-CM | POA: Diagnosis not present

## 2017-01-09 DIAGNOSIS — T148XXA Other injury of unspecified body region, initial encounter: Secondary | ICD-10-CM | POA: Diagnosis not present

## 2017-01-09 DIAGNOSIS — L03114 Cellulitis of left upper limb: Secondary | ICD-10-CM | POA: Diagnosis not present

## 2017-01-17 ENCOUNTER — Ambulatory Visit
Admission: RE | Admit: 2017-01-17 | Discharge: 2017-01-17 | Disposition: A | Discharge status: Home / Self Care | Payer: Medicare Other | Source: Ambulatory Visit | Attending: Family Medicine | Admitting: Family Medicine

## 2017-01-17 ENCOUNTER — Other Ambulatory Visit: Payer: Self-pay | Admitting: Family Medicine

## 2017-01-17 DIAGNOSIS — S1980XA Other specified injuries of unspecified part of neck, initial encounter: Secondary | ICD-10-CM | POA: Diagnosis not present

## 2017-01-17 DIAGNOSIS — T1490XA Injury, unspecified, initial encounter: Secondary | ICD-10-CM

## 2017-01-17 DIAGNOSIS — M542 Cervicalgia: Secondary | ICD-10-CM | POA: Diagnosis not present

## 2017-01-19 DIAGNOSIS — D044 Carcinoma in situ of skin of scalp and neck: Secondary | ICD-10-CM | POA: Diagnosis not present

## 2017-02-12 DIAGNOSIS — M48062 Spinal stenosis, lumbar region with neurogenic claudication: Secondary | ICD-10-CM | POA: Diagnosis not present

## 2017-02-12 DIAGNOSIS — M4712 Other spondylosis with myelopathy, cervical region: Secondary | ICD-10-CM | POA: Diagnosis not present

## 2017-02-23 DIAGNOSIS — M4802 Spinal stenosis, cervical region: Secondary | ICD-10-CM | POA: Diagnosis not present

## 2017-02-23 DIAGNOSIS — M5021 Other cervical disc displacement,  high cervical region: Secondary | ICD-10-CM | POA: Diagnosis not present

## 2017-02-23 DIAGNOSIS — M47813 Spondylosis without myelopathy or radiculopathy, cervicothoracic region: Secondary | ICD-10-CM | POA: Diagnosis not present

## 2017-02-23 DIAGNOSIS — M5023 Other cervical disc displacement, cervicothoracic region: Secondary | ICD-10-CM | POA: Diagnosis not present

## 2017-02-23 DIAGNOSIS — Z981 Arthrodesis status: Secondary | ICD-10-CM | POA: Diagnosis not present

## 2017-02-23 DIAGNOSIS — M50222 Other cervical disc displacement at C5-C6 level: Secondary | ICD-10-CM | POA: Diagnosis not present

## 2017-02-23 DIAGNOSIS — M48062 Spinal stenosis, lumbar region with neurogenic claudication: Secondary | ICD-10-CM | POA: Diagnosis not present

## 2017-02-23 DIAGNOSIS — M4803 Spinal stenosis, cervicothoracic region: Secondary | ICD-10-CM | POA: Diagnosis not present

## 2017-03-15 DIAGNOSIS — M4712 Other spondylosis with myelopathy, cervical region: Secondary | ICD-10-CM | POA: Diagnosis not present

## 2017-04-10 IMAGING — CT CT ANGIO AOBIFEM WO/W CM
1 of 13 series · 13 of 47 positions shown, 16 images · IV contrast (APPLIED)
Comparison: None.

CLINICAL DATA: 65-year-old male with a history of acute right leg
pain.

EXAM:
CT ABDOMEN AND PELVIS WITH CONTRAST
TECHNIQUE: Multidetector CT imaging of the abdomen and pelvis was performed
using the standard protocol following bolus administration of
intravenous contrast.
CONTRAST:  100 cc Isovue 370

[Series 5: angiorunoff 3.0 i30s 3 · axial · 0.77mm/px · z∈[-1306,-145]mm · 13 of 432 slices shown, 16 images]
[im 30/432  soft-tissue]
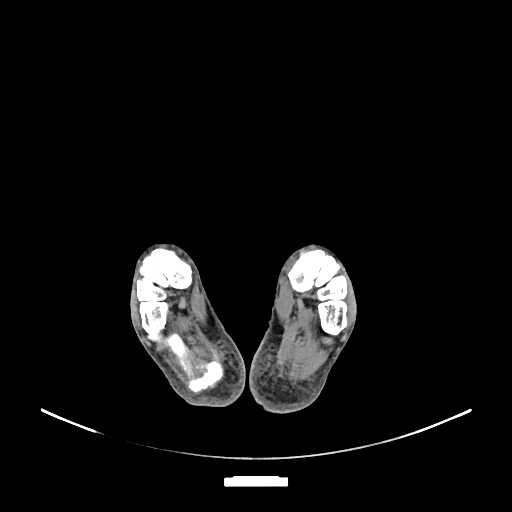
[im 30/432  bone]
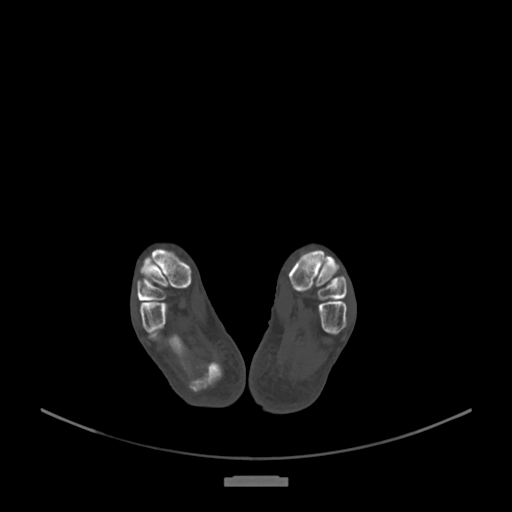
[im 75/432  soft-tissue]
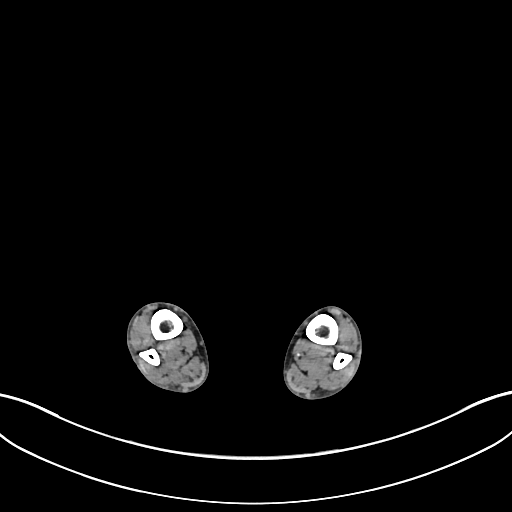
[im 119/432  soft-tissue]
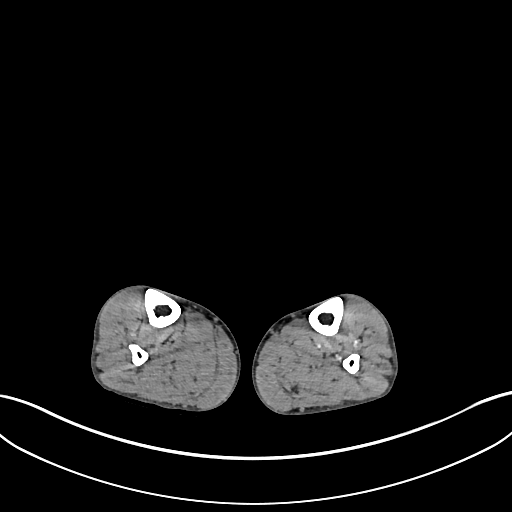
[im 149/432  soft-tissue]
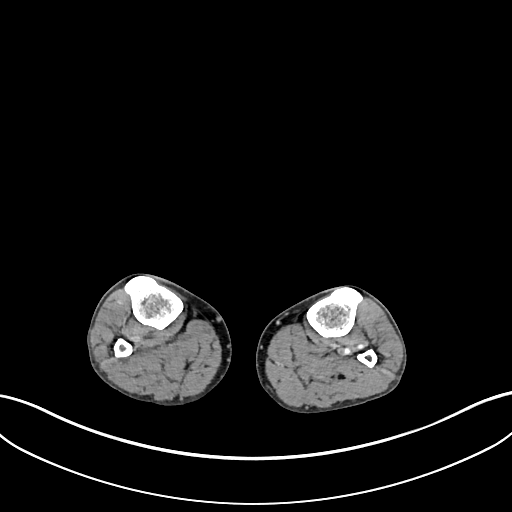
[im 194/432  soft-tissue]
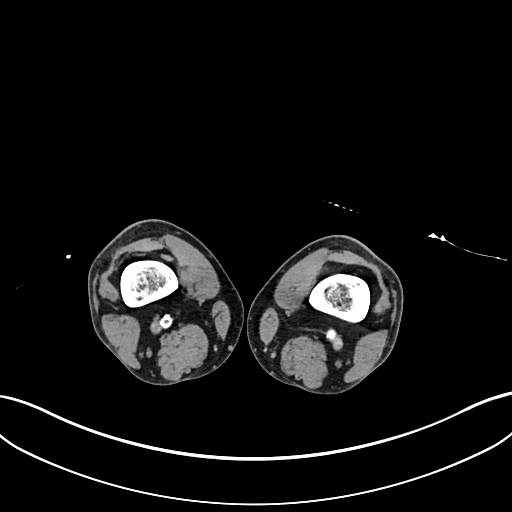
[im 238/432  soft-tissue]
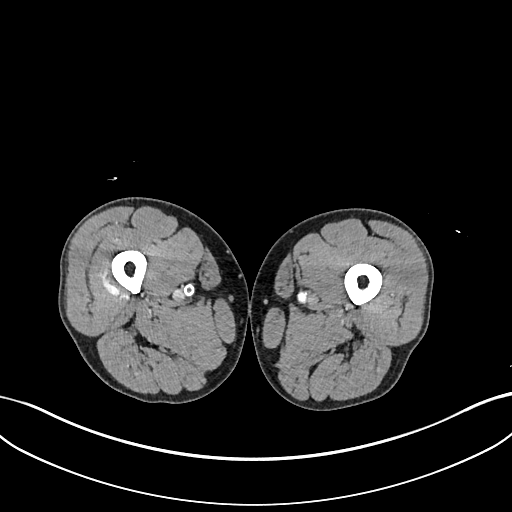
[im 283/432  soft-tissue]
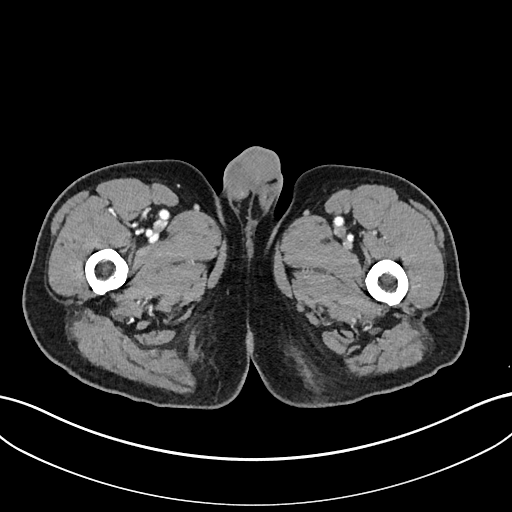
[im 327/432  soft-tissue]
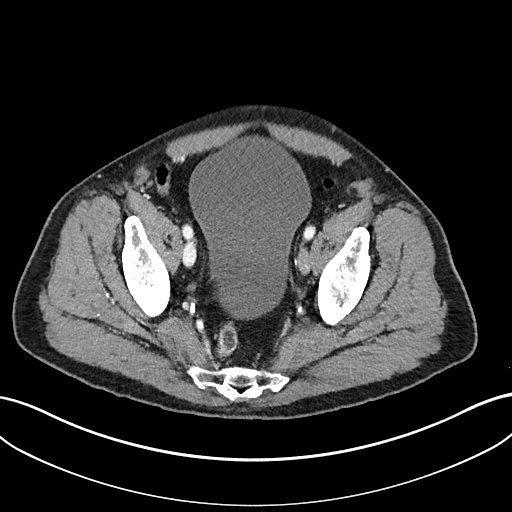
[im 357/432  soft-tissue]
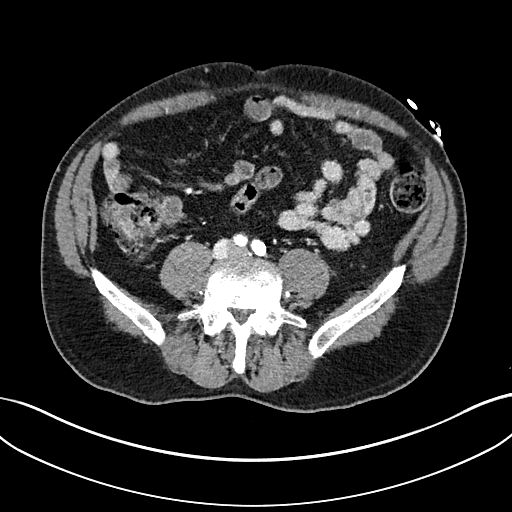
[im 357/432  bone]
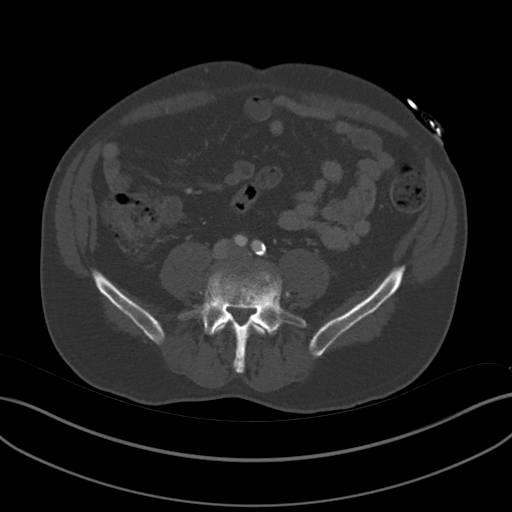
[im 372/432  lung]
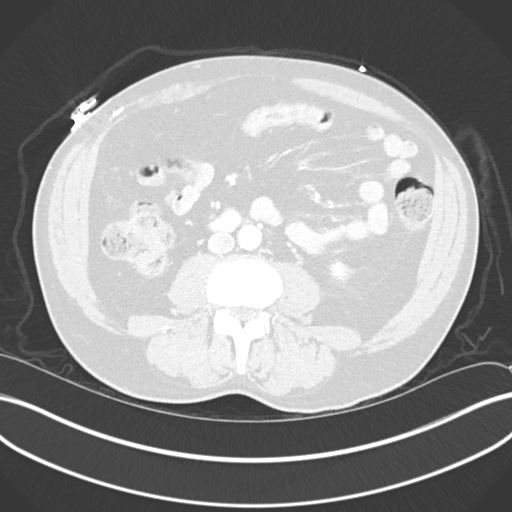
[im 387/432  lung]
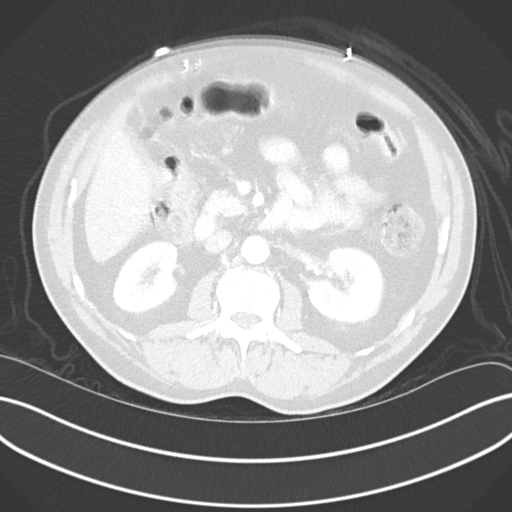
[im 402/432  soft-tissue]
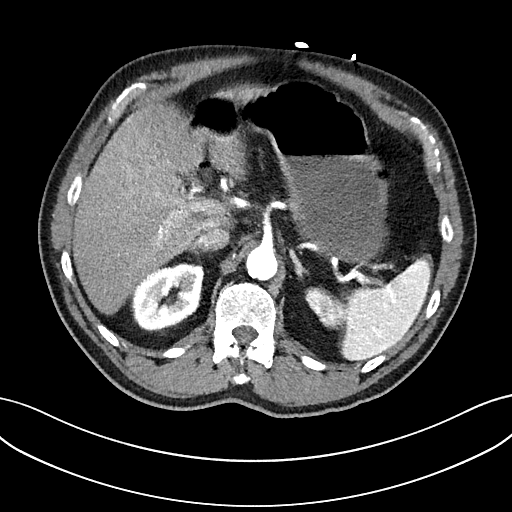
[im 402/432  lung]
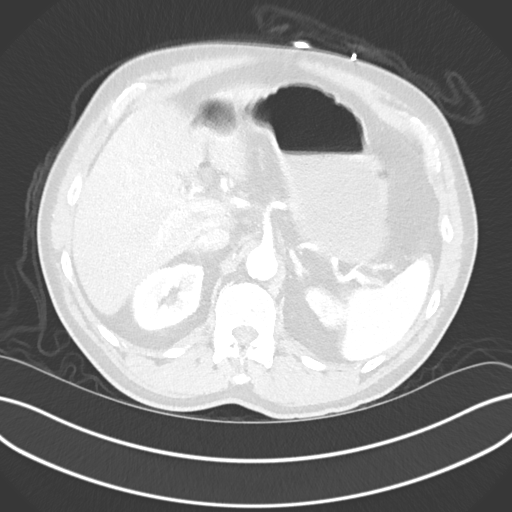
[im 417/432  lung]
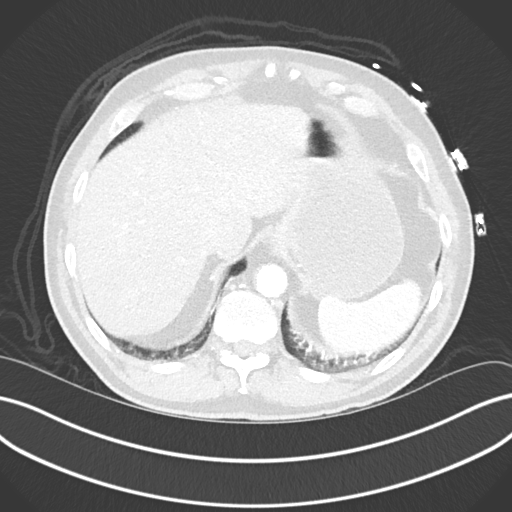

[13 of 47 positions shown; findings below may reference images not displayed]

FINDINGS: Lower chest:

Unremarkable appearance of the soft tissues of the chest wall.

Heart size within normal limits.  No pericardial fluid/thickening.

Unremarkable appearance of the distal esophagus.

No hiatal hernia.

Respiratory motion somewhat limits evaluation of the lungs.
Calcified granuloma of the right lung base. No pleural effusion.

Abdomen/pelvis:

Nonvascular:

Calcifications of the spleen, compatible with prior granulomatous
disease.

Unremarkable liver.

Unremarkable bilateral adrenal glands.

Punctate calcifications of pancreatic parenchyma, potentially
representing prior pancreatitis. No associated inflammatory changes.
Low-density lesion of the pancreatic tail measures 10 mm.

No abnormally distended small bowel or colon.

Normal appendix.

Diverticular disease without evidence of acute diverticulitis.

Unremarkable bilateral kidneys.

Urinary bladder partially distended.

Transverse diameter of the prostate measures 4 cm.

Vascular:

Distal thoracic aorta unremarkable.

Mixed calcified and soft plaque of the abdominal aorta without
aneurysm or dissection flap. No periaortic fluid.

Mesenteric vessels:

Calcifications at the origin of the celiac artery and superior
mesenteric artery. There [DATE]% narrowing at the origin of the
SMA secondary to calcified plaque.

Inferior mesenteric artery is patent.

Tiny accessory left renal artery. Mild plaque at the origin of the
main left renal artery, without significant stenosis or occlusion.

Mixed plaque at the origin of the right renal artery, without
significant stenosis identified or occlusion.

Right lower extremity:

Course caliber and contour of the right iliac system unremarkable
with no aneurysm or dissection flap. Calcified plaque present in the
iliofemoral system. Hypogastric artery is patent including anterior
and posterior division.

Mixed plaque of the common femoral artery.

Profunda femoris patent.

Origin of the superficial femoral artery is patent. Vascular stent
of the mid segment of the right superficial femoral artery which
extends to the popliteal artery. The SFA is occluded beginning at
the proximal stent margin extending through the popliteal artery.

Patency of the tibial vessels with reconstitution is limited, as the
acquisition of the images may be before the arrival of the contrast
bolus.

Left lower extremity:

Calcifications at the origin of the left iliac artery. Unremarkable
course caliber and contour of the left iliac artery. Hypogastric
artery appears occluded beyond the origin.

No aneurysm or dissection of the left iliac artery.

Mild disease of the common femoral artery which is patent. Profunda
femoris is patent.

Left superficial femoral artery demonstrates mild plaque in the
canal though remains patent. Popliteal artery remains patent.

Origin of the anterior tibial artery is patent as well as the
tibioperoneal trunk. Calcifications are present along the length of
the tibial vessels. At the ankle there appears to be maintain
patency of the anterior tibial artery and posterior tibial artery.
Difficult to assess patency of the peroneal artery.

Musculoskeletal:

Mild degenerative disc disease, worst at the L5-S1 level.

No displaced fracture.
IMPRESSION: Occlusion of the right superficial femoral artery in the mid and
distal segment, with the occlusion extending to the popliteal
artery. The occlusion occurred is at the proximal margin of a stent
system which extends to the above knee popliteal artery. Below this,
it is difficult to assess patency of the popliteal artery and
proximal tibial vessels, as the image acquisition may have occurred
before arrival of the contrast bolus. Acuity cannot be assessed by
CT, and correlation with presentation and noninvasive arterial
testing is recommended.

Low-density 10 mm lesion of the pancreas. While this is most likely
benign, ACR recommendation is for a 12 month pancreas MRI pancreatic
follow-up.

These results were called by telephone at the time of interpretation
on 07/24/2015 at [DATE] to Dr. ELMERT SINCHI , who verbally
acknowledged these results.

## 2017-04-18 DIAGNOSIS — Z6824 Body mass index (BMI) 24.0-24.9, adult: Secondary | ICD-10-CM | POA: Diagnosis not present

## 2017-04-18 DIAGNOSIS — I10 Essential (primary) hypertension: Secondary | ICD-10-CM | POA: Diagnosis not present

## 2017-04-18 DIAGNOSIS — C44629 Squamous cell carcinoma of skin of left upper limb, including shoulder: Secondary | ICD-10-CM | POA: Diagnosis not present

## 2017-04-18 DIAGNOSIS — D485 Neoplasm of uncertain behavior of skin: Secondary | ICD-10-CM | POA: Diagnosis not present

## 2017-04-18 DIAGNOSIS — Z794 Long term (current) use of insulin: Secondary | ICD-10-CM | POA: Diagnosis not present

## 2017-04-18 DIAGNOSIS — E1159 Type 2 diabetes mellitus with other circulatory complications: Secondary | ICD-10-CM | POA: Diagnosis not present

## 2017-06-14 DIAGNOSIS — C44529 Squamous cell carcinoma of skin of other part of trunk: Secondary | ICD-10-CM | POA: Diagnosis not present

## 2017-06-14 DIAGNOSIS — L905 Scar conditions and fibrosis of skin: Secondary | ICD-10-CM | POA: Diagnosis not present

## 2017-06-14 DIAGNOSIS — L570 Actinic keratosis: Secondary | ICD-10-CM | POA: Diagnosis not present

## 2017-07-19 DIAGNOSIS — E1159 Type 2 diabetes mellitus with other circulatory complications: Secondary | ICD-10-CM | POA: Diagnosis not present

## 2017-07-19 DIAGNOSIS — Z6823 Body mass index (BMI) 23.0-23.9, adult: Secondary | ICD-10-CM | POA: Diagnosis not present

## 2017-07-19 DIAGNOSIS — Z794 Long term (current) use of insulin: Secondary | ICD-10-CM | POA: Diagnosis not present

## 2017-07-19 DIAGNOSIS — I10 Essential (primary) hypertension: Secondary | ICD-10-CM | POA: Diagnosis not present

## 2017-08-24 DIAGNOSIS — J22 Unspecified acute lower respiratory infection: Secondary | ICD-10-CM | POA: Diagnosis not present

## 2017-08-29 DIAGNOSIS — R82998 Other abnormal findings in urine: Secondary | ICD-10-CM | POA: Diagnosis not present

## 2017-08-29 DIAGNOSIS — E1159 Type 2 diabetes mellitus with other circulatory complications: Secondary | ICD-10-CM | POA: Diagnosis not present

## 2017-08-29 DIAGNOSIS — E7849 Other hyperlipidemia: Secondary | ICD-10-CM | POA: Diagnosis not present

## 2017-08-29 DIAGNOSIS — Z125 Encounter for screening for malignant neoplasm of prostate: Secondary | ICD-10-CM | POA: Diagnosis not present

## 2017-08-29 DIAGNOSIS — I10 Essential (primary) hypertension: Secondary | ICD-10-CM | POA: Diagnosis not present

## 2017-09-05 DIAGNOSIS — I739 Peripheral vascular disease, unspecified: Secondary | ICD-10-CM | POA: Diagnosis not present

## 2017-09-05 DIAGNOSIS — R634 Abnormal weight loss: Secondary | ICD-10-CM | POA: Diagnosis not present

## 2017-09-05 DIAGNOSIS — Z23 Encounter for immunization: Secondary | ICD-10-CM | POA: Diagnosis not present

## 2017-09-05 DIAGNOSIS — Z6823 Body mass index (BMI) 23.0-23.9, adult: Secondary | ICD-10-CM | POA: Diagnosis not present

## 2017-09-05 DIAGNOSIS — E7849 Other hyperlipidemia: Secondary | ICD-10-CM | POA: Diagnosis not present

## 2017-09-05 DIAGNOSIS — Z0000 Encounter for general adult medical examination without abnormal findings: Secondary | ICD-10-CM | POA: Diagnosis not present

## 2017-09-05 DIAGNOSIS — I10 Essential (primary) hypertension: Secondary | ICD-10-CM | POA: Diagnosis not present

## 2017-09-05 DIAGNOSIS — Z794 Long term (current) use of insulin: Secondary | ICD-10-CM | POA: Diagnosis not present

## 2017-09-05 DIAGNOSIS — Z1389 Encounter for screening for other disorder: Secondary | ICD-10-CM | POA: Diagnosis not present

## 2017-09-05 DIAGNOSIS — J449 Chronic obstructive pulmonary disease, unspecified: Secondary | ICD-10-CM | POA: Diagnosis not present

## 2017-09-05 DIAGNOSIS — E1151 Type 2 diabetes mellitus with diabetic peripheral angiopathy without gangrene: Secondary | ICD-10-CM | POA: Diagnosis not present

## 2017-09-10 DIAGNOSIS — E1151 Type 2 diabetes mellitus with diabetic peripheral angiopathy without gangrene: Secondary | ICD-10-CM | POA: Diagnosis not present

## 2017-10-12 DIAGNOSIS — J040 Acute laryngitis: Secondary | ICD-10-CM | POA: Diagnosis not present

## 2017-10-17 DIAGNOSIS — Z794 Long term (current) use of insulin: Secondary | ICD-10-CM | POA: Diagnosis not present

## 2017-10-17 DIAGNOSIS — E1151 Type 2 diabetes mellitus with diabetic peripheral angiopathy without gangrene: Secondary | ICD-10-CM | POA: Diagnosis not present

## 2017-10-17 DIAGNOSIS — F17200 Nicotine dependence, unspecified, uncomplicated: Secondary | ICD-10-CM | POA: Diagnosis not present

## 2017-10-17 DIAGNOSIS — I10 Essential (primary) hypertension: Secondary | ICD-10-CM | POA: Diagnosis not present

## 2017-11-08 DIAGNOSIS — I10 Essential (primary) hypertension: Secondary | ICD-10-CM | POA: Diagnosis not present

## 2017-11-08 DIAGNOSIS — I739 Peripheral vascular disease, unspecified: Secondary | ICD-10-CM | POA: Diagnosis not present

## 2017-11-08 DIAGNOSIS — F17200 Nicotine dependence, unspecified, uncomplicated: Secondary | ICD-10-CM | POA: Diagnosis not present

## 2017-11-08 DIAGNOSIS — E7800 Pure hypercholesterolemia, unspecified: Secondary | ICD-10-CM | POA: Diagnosis not present

## 2017-11-09 DIAGNOSIS — D485 Neoplasm of uncertain behavior of skin: Secondary | ICD-10-CM | POA: Diagnosis not present

## 2017-11-09 DIAGNOSIS — C44329 Squamous cell carcinoma of skin of other parts of face: Secondary | ICD-10-CM | POA: Diagnosis not present

## 2017-11-09 DIAGNOSIS — L570 Actinic keratosis: Secondary | ICD-10-CM | POA: Diagnosis not present

## 2017-11-22 DIAGNOSIS — L570 Actinic keratosis: Secondary | ICD-10-CM | POA: Diagnosis not present

## 2017-11-22 DIAGNOSIS — D0439 Carcinoma in situ of skin of other parts of face: Secondary | ICD-10-CM | POA: Diagnosis not present

## 2018-01-03 DIAGNOSIS — L570 Actinic keratosis: Secondary | ICD-10-CM | POA: Diagnosis not present

## 2018-01-03 DIAGNOSIS — Z85828 Personal history of other malignant neoplasm of skin: Secondary | ICD-10-CM | POA: Diagnosis not present

## 2018-01-16 DIAGNOSIS — Z23 Encounter for immunization: Secondary | ICD-10-CM | POA: Diagnosis not present

## 2018-02-25 DIAGNOSIS — K429 Umbilical hernia without obstruction or gangrene: Secondary | ICD-10-CM | POA: Diagnosis not present

## 2018-02-25 DIAGNOSIS — I739 Peripheral vascular disease, unspecified: Secondary | ICD-10-CM | POA: Diagnosis not present

## 2018-02-25 DIAGNOSIS — E119 Type 2 diabetes mellitus without complications: Secondary | ICD-10-CM | POA: Diagnosis not present

## 2018-02-25 DIAGNOSIS — Z0000 Encounter for general adult medical examination without abnormal findings: Secondary | ICD-10-CM | POA: Diagnosis not present

## 2018-03-11 DIAGNOSIS — J069 Acute upper respiratory infection, unspecified: Secondary | ICD-10-CM | POA: Diagnosis not present

## 2018-03-18 ENCOUNTER — Other Ambulatory Visit: Payer: Self-pay | Admitting: Physician Assistant

## 2018-03-18 ENCOUNTER — Ambulatory Visit
Admission: RE | Admit: 2018-03-18 | Discharge: 2018-03-18 | Disposition: A | Discharge status: Home / Self Care | Payer: Medicare Other | Source: Ambulatory Visit | Attending: Physician Assistant | Admitting: Physician Assistant

## 2018-03-18 DIAGNOSIS — R059 Cough, unspecified: Secondary | ICD-10-CM

## 2018-03-18 DIAGNOSIS — R05 Cough: Secondary | ICD-10-CM

## 2018-03-18 DIAGNOSIS — J22 Unspecified acute lower respiratory infection: Secondary | ICD-10-CM | POA: Diagnosis not present

## 2018-04-25 DIAGNOSIS — Z1212 Encounter for screening for malignant neoplasm of rectum: Secondary | ICD-10-CM | POA: Diagnosis not present

## 2018-04-25 DIAGNOSIS — Z1211 Encounter for screening for malignant neoplasm of colon: Secondary | ICD-10-CM | POA: Diagnosis not present

## 2018-05-13 DIAGNOSIS — Z95820 Peripheral vascular angioplasty status with implants and grafts: Secondary | ICD-10-CM | POA: Diagnosis not present

## 2018-05-13 DIAGNOSIS — J449 Chronic obstructive pulmonary disease, unspecified: Secondary | ICD-10-CM | POA: Diagnosis not present

## 2018-05-13 DIAGNOSIS — Z86010 Personal history of colonic polyps: Secondary | ICD-10-CM | POA: Diagnosis not present

## 2018-05-13 DIAGNOSIS — R195 Other fecal abnormalities: Secondary | ICD-10-CM | POA: Diagnosis not present

## 2018-05-30 DIAGNOSIS — D123 Benign neoplasm of transverse colon: Secondary | ICD-10-CM | POA: Diagnosis not present

## 2018-05-30 DIAGNOSIS — D120 Benign neoplasm of cecum: Secondary | ICD-10-CM | POA: Diagnosis not present

## 2018-05-30 DIAGNOSIS — K5730 Diverticulosis of large intestine without perforation or abscess without bleeding: Secondary | ICD-10-CM | POA: Diagnosis not present

## 2018-05-30 DIAGNOSIS — R195 Other fecal abnormalities: Secondary | ICD-10-CM | POA: Diagnosis not present

## 2018-05-30 DIAGNOSIS — K635 Polyp of colon: Secondary | ICD-10-CM | POA: Diagnosis not present

## 2018-05-30 DIAGNOSIS — Z1211 Encounter for screening for malignant neoplasm of colon: Secondary | ICD-10-CM | POA: Diagnosis not present

## 2018-05-30 DIAGNOSIS — D124 Benign neoplasm of descending colon: Secondary | ICD-10-CM | POA: Diagnosis not present

## 2018-06-12 DIAGNOSIS — Z794 Long term (current) use of insulin: Secondary | ICD-10-CM | POA: Diagnosis not present

## 2018-06-12 DIAGNOSIS — F17200 Nicotine dependence, unspecified, uncomplicated: Secondary | ICD-10-CM | POA: Diagnosis not present

## 2018-06-12 DIAGNOSIS — I10 Essential (primary) hypertension: Secondary | ICD-10-CM | POA: Diagnosis not present

## 2018-06-12 DIAGNOSIS — E1151 Type 2 diabetes mellitus with diabetic peripheral angiopathy without gangrene: Secondary | ICD-10-CM | POA: Diagnosis not present

## 2018-06-12 DIAGNOSIS — Z6825 Body mass index (BMI) 25.0-25.9, adult: Secondary | ICD-10-CM | POA: Diagnosis not present

## 2018-07-01 ENCOUNTER — Other Ambulatory Visit: Payer: Self-pay

## 2018-09-04 DIAGNOSIS — E7849 Other hyperlipidemia: Secondary | ICD-10-CM | POA: Diagnosis not present

## 2018-09-04 DIAGNOSIS — E1159 Type 2 diabetes mellitus with other circulatory complications: Secondary | ICD-10-CM | POA: Diagnosis not present

## 2018-09-04 DIAGNOSIS — Z125 Encounter for screening for malignant neoplasm of prostate: Secondary | ICD-10-CM | POA: Diagnosis not present

## 2018-10-17 DIAGNOSIS — H25813 Combined forms of age-related cataract, bilateral: Secondary | ICD-10-CM | POA: Diagnosis not present

## 2018-10-17 DIAGNOSIS — H04563 Stenosis of bilateral lacrimal punctum: Secondary | ICD-10-CM | POA: Diagnosis not present

## 2018-10-23 DIAGNOSIS — F17200 Nicotine dependence, unspecified, uncomplicated: Secondary | ICD-10-CM | POA: Diagnosis not present

## 2018-10-23 DIAGNOSIS — E1151 Type 2 diabetes mellitus with diabetic peripheral angiopathy without gangrene: Secondary | ICD-10-CM | POA: Diagnosis not present

## 2018-10-23 DIAGNOSIS — Z794 Long term (current) use of insulin: Secondary | ICD-10-CM | POA: Diagnosis not present

## 2018-10-23 DIAGNOSIS — I129 Hypertensive chronic kidney disease with stage 1 through stage 4 chronic kidney disease, or unspecified chronic kidney disease: Secondary | ICD-10-CM | POA: Diagnosis not present

## 2018-10-23 DIAGNOSIS — I10 Essential (primary) hypertension: Secondary | ICD-10-CM | POA: Diagnosis not present

## 2018-10-23 DIAGNOSIS — N183 Chronic kidney disease, stage 3 (moderate): Secondary | ICD-10-CM | POA: Diagnosis not present

## 2018-10-29 DIAGNOSIS — C44329 Squamous cell carcinoma of skin of other parts of face: Secondary | ICD-10-CM | POA: Diagnosis not present

## 2018-10-29 DIAGNOSIS — L570 Actinic keratosis: Secondary | ICD-10-CM | POA: Diagnosis not present

## 2018-10-29 DIAGNOSIS — L821 Other seborrheic keratosis: Secondary | ICD-10-CM | POA: Diagnosis not present

## 2018-10-29 DIAGNOSIS — D1801 Hemangioma of skin and subcutaneous tissue: Secondary | ICD-10-CM | POA: Diagnosis not present

## 2018-10-29 DIAGNOSIS — L820 Inflamed seborrheic keratosis: Secondary | ICD-10-CM | POA: Diagnosis not present

## 2018-10-29 DIAGNOSIS — D485 Neoplasm of uncertain behavior of skin: Secondary | ICD-10-CM | POA: Diagnosis not present

## 2018-10-29 DIAGNOSIS — Z85828 Personal history of other malignant neoplasm of skin: Secondary | ICD-10-CM | POA: Diagnosis not present

## 2018-10-29 DIAGNOSIS — C44529 Squamous cell carcinoma of skin of other part of trunk: Secondary | ICD-10-CM | POA: Diagnosis not present

## 2018-11-05 DIAGNOSIS — C44529 Squamous cell carcinoma of skin of other part of trunk: Secondary | ICD-10-CM | POA: Diagnosis not present

## 2018-11-05 DIAGNOSIS — L570 Actinic keratosis: Secondary | ICD-10-CM | POA: Diagnosis not present

## 2018-11-07 DIAGNOSIS — D0439 Carcinoma in situ of skin of other parts of face: Secondary | ICD-10-CM | POA: Diagnosis not present

## 2018-11-08 ENCOUNTER — Encounter: Payer: Self-pay | Admitting: Cardiology

## 2018-11-11 ENCOUNTER — Other Ambulatory Visit: Payer: Self-pay

## 2018-11-11 ENCOUNTER — Encounter: Payer: Self-pay | Admitting: Cardiology

## 2018-11-11 ENCOUNTER — Telehealth (INDEPENDENT_AMBULATORY_CARE_PROVIDER_SITE_OTHER): Payer: Medicare Other | Admitting: Cardiology

## 2018-11-11 VITALS — BP 130/85 | HR 102 | Ht 68.0 in | Wt 160.0 lb

## 2018-11-11 DIAGNOSIS — F17209 Nicotine dependence, unspecified, with unspecified nicotine-induced disorders: Secondary | ICD-10-CM

## 2018-11-11 DIAGNOSIS — I739 Peripheral vascular disease, unspecified: Secondary | ICD-10-CM

## 2018-11-11 DIAGNOSIS — R0609 Other forms of dyspnea: Secondary | ICD-10-CM | POA: Diagnosis not present

## 2018-11-11 DIAGNOSIS — I10 Essential (primary) hypertension: Secondary | ICD-10-CM | POA: Diagnosis not present

## 2018-11-11 DIAGNOSIS — E1151 Type 2 diabetes mellitus with diabetic peripheral angiopathy without gangrene: Secondary | ICD-10-CM | POA: Diagnosis not present

## 2018-11-11 NOTE — Progress Notes (Signed)
Virtual Visit via Video Note: This visit type was conducted due to national recommendations for restrictions regarding the COVID-19 Pandemic (e.g. social distancing).  This format is felt to be most appropriate for this patient at this time.  All issues noted in this document were discussed and addressed.  No physical exam was performed (except for noted visual exam findings with Telehealth visits).  The patient has consented to conduct a Telehealth visit and understands insurance will be billed.   I connected with@, on 11/11/18 at  by a video enabled telemedicine application and verified that I am speaking with the correct person using two identifiers.   I discussed the limitations of evaluation and management by telemedicine and the availability of in person appointments. The patient expressed understanding and agreed to proceed.   I have discussed with patient regarding the safety during COVID Pandemic and steps and precautions to be taken including social distancing, frequent hand wash and use of detergent soap, gels with the patient. I asked the patient to avoid touching mouth, nose, eyes, ears with the hands. I encouraged regular walking around the neighborhood and exercise and regular diet, as long as social distancing can be maintained.     Primary Physician/Referring:  Aletha Halim., PA-C  Patient ID: Terry Munoz, male    DOB: [birth year], 68 y.o.   MRN: [id number]  CC: Claudication and hypertension  HPI:    Terry Munoz  is a 68 y.o. Caucasian male with greater than 100-pack-year history of smoking, hyperlipidemia, hyper, diabetes mellitus, peripheral arterial disease with right SFA stenting in 2016 with Viabahn covered stents, here for 6 month follow-up of PAD and hypertension. He is presently doing well and states that he has minimal symptoms of claudication in his legs with activity. He still smoking about one pack of cigarettes a day.  He is presently doing well and  denies any chest pain or shortness of breath except for chronic dyspnea and chronic cough.  Denies any bluish discoloration in his toes or ulceration.  States that his claudication symptoms are very minimal.  Past Medical History:  Diagnosis Date  . COPD (chronic obstructive pulmonary disease) ([place])   . Diabetes mellitus without complication ([employer])    INSULIN DEPENDENT  . Hypertension   . Hyponatremia 03/2014  . Insomnia   . Kidney stones   . Mouth ulcers   . Osteoarthritis   . Otitis media   . Peripheral vascular disease ([place])   . Sinusitis    Past Surgical History:  Procedure Laterality Date  . DENTAL SURGERY    . FEMORAL-POPLITEAL BYPASS GRAFT Right 07/24/2015   Procedure: Thrombectomy Right Femoral-Popliteal Artery; Thrombectomy Right Anterior-Tibial Artery, Thrombectomy Right Tibial-Peroneal Trunk; Bovine Patch Angioplasty Right Popliteal Artery; Endarterectomy Right Anterior Tibial Artery and Right Tibial-Peroneal Trunk; Right Leg Angiogram, Four Compartment Fasciotomy Right Lower Leg;  Surgeon: Conrad Haslett, MD;  Location: [facility];  Service: Vascular;  L  . HERNIA REPAIR    . lithotomy    . LOWER EXTREMITY ANGIOGRAM N/A 05/01/2014   Procedure: LOWER EXTREMITY ANGIOGRAM;  Surgeon: Laverda Page, MD;  Location: [facility] CATH LAB;  Service: Cardiovascular;  Laterality: N/A;  . NECK SURGERY    . POPLITEAL ARTERY ANGIOPLASTY Right 05/02/2014   to mid sfa  . THROMBECTOMY  05/02/2014   Social History   Socioeconomic History  . Marital status: Married    Spouse name: Not on file  . Number of children: Not on file  . Years of  education: Not on file  . Highest education level: Not on file  Occupational History  . Not on file  Social Needs  . Financial resource strain: Not on file  . Food insecurity    Worry: Not on file    Inability: Not on file  . Transportation needs    Medical: Not on file    Non-medical: Not on file  Tobacco Use  . Smoking status: Former Smoker     Packs/day: 2.00    Years: 45.00    Pack years: 90.00    Types: Cigarettes    Quit date: 04/27/2014    Years since quitting: 4.5  . Smokeless tobacco: Never Used  Substance and Sexual Activity  . Alcohol use: Yes    Alcohol/week: 24.0 standard drinks    Types: 24 Cans of beer per week  . Drug use: No  . Sexual activity: Not on file  Lifestyle  . Physical activity    Days per week: Not on file    Minutes per session: Not on file  . Stress: Not on file  Relationships  . Social Herbalist on phone: Not on file    Gets together: Not on file    Attends religious service: Not on file    Active member of club or organization: Not on file    Attends meetings of clubs or organizations: Not on file    Relationship status: Not on file  . Intimate partner violence    Fear of current or ex partner: Not on file    Emotionally abused: Not on file    Physically abused: Not on file    Forced sexual activity: Not on file  Other Topics Concern  . Not on file  Social History Narrative  . Not on file   ROS  Review of Systems  Constitution: Negative for chills, decreased appetite, malaise/fatigue and weight gain.  Cardiovascular: Positive for claudication (mild). Negative for dyspnea on exertion, leg swelling and syncope.  Respiratory: Positive for cough (chronic) and shortness of breath (stable).   Endocrine: Negative for cold intolerance.  Hematologic/Lymphatic: Does not bruise/bleed easily.  Musculoskeletal: Negative for joint swelling.  Gastrointestinal: Negative for abdominal pain, anorexia, change in bowel habit, hematochezia and melena.  Neurological: Negative for headaches and light-headedness.  Psychiatric/Behavioral: Negative for depression and substance abuse.  All other systems reviewed and are negative.  Objective  Blood pressure 130/85, pulse (!) 102, height 5' 8"  (1.727 m), weight 160 lb (72.6 kg). Body mass index is 24.33 kg/m.   Physical Exam  Constitutional:  He appears well-developed and well-nourished. No distress.  HENT:  Head: Atraumatic.  Eyes: Conjunctivae are normal.  Neck: Neck supple. No JVD present. No thyromegaly present.  Cardiovascular: Normal rate, regular rhythm, normal heart sounds and intact distal pulses. Exam reveals no gallop.  No murmur heard. Bilateral loss of hair present, pigmented, no varicose veins.  Femoral pulses bilateral 1+, popliteal pulse 1+, DP feeble, PT left 2+, right absent.  No carotid bruit. No leg edema.  No JVD.  Pulmonary/Chest: Effort normal. He has rales (Bilateral bronchovesicular breath sounds with occasinal rales bilateral).  Barrel-shaped chest.  Abdominal: Soft. Bowel sounds are normal.  Reducible umbilical hernia present.  Musculoskeletal: Normal range of motion.  Neurological: He is alert.  Skin: Skin is warm and dry.  Psychiatric: He has a normal mood and affect.   Radiology: No results found.  Laboratory examination:   No results for input(s): NA, K, CL, CO2,  GLUCOSE, BUN, CREATININE, CALCIUM, GFRNONAA, GFRAA in the last 8760 hours. CMP Latest Ref Rng & Units 09/02/2015 07/25/2015 07/24/2015  Glucose 65 - 99 mg/dL 130(H) 108(H) -  BUN 6 - 20 mg/dL 17 11 -  Creatinine 0.61 - 1.24 mg/dL 1.29(H) 1.11 1.24  Sodium 135 - 145 mmol/L 134(L) 136 -  Potassium 3.5 - 5.1 mmol/L 4.3 3.8 -  Chloride 101 - 111 mmol/L 99(L) 101 -  CO2 22 - 32 mmol/L 23 26 -  Calcium 8.9 - 10.3 mg/dL 9.6 8.3(L) -  Total Protein 6.5 - 8.1 g/dL 7.8 - -  Total Bilirubin 0.3 - 1.2 mg/dL 0.7 - -  Alkaline Phos 38 - 126 U/L 97 - -  AST 15 - 41 U/L 25 - -  ALT 17 - 63 U/L 24 - -   CBC Latest Ref Rng & Units 09/02/2015 07/28/2015 07/25/2015  WBC 4.0 - 10.5 K/uL 15.4(H) 7.7 14.8(H)  Hemoglobin 13.0 - 17.0 g/dL 13.9 10.7(L) 13.7  Hematocrit 39.0 - 52.0 % 41.4 31.0(L) 40.3  Platelets 150 - 400 K/uL 216 174 173   Lipid Panel     Component Value Date/Time   CHOL 115 04/20/2014 0414   TRIG 57 04/20/2014 0414   HDL 53  04/20/2014 0414   CHOLHDL 2.2 04/20/2014 0414   VLDL 11 04/20/2014 0414   LDLCALC 51 04/20/2014 0414   HEMOGLOBIN A1C Lab Results  Component Value Date   HGBA1C 7.5 (H) 07/24/2015   MPG 169 07/24/2015   TSH No results for input(s): TSH in the last 8760 hours. Medications   Current Outpatient Medications  Medication Instructions  . albuterol (PROVENTIL HFA;VENTOLIN HFA) 108 (90 Base) MCG/ACT inhaler 1-2 puffs, Inhalation, Every 6 hours PRN  . albuterol (PROVENTIL) 2.5 mg, Every 6 hours PRN  . amitriptyline (ELAVIL) 50 mg, Daily at bedtime  . Apoaequorin 10 MG CAPS 1 capsule, Oral, Daily  . atorvastatin (LIPITOR) 40 mg, Oral, Daily  . budesonide-formoterol (SYMBICORT) 160-4.5 MCG/ACT inhaler 2 puffs, Inhalation, 2 times daily  . clopidogrel (PLAVIX) 75 mg, Oral, Daily  . Empagliflozin-linaGLIPtin 25-5 MG TABS 1 tablet, Oral, Daily  . insulin glargine (LANTUS) 36 Units, Subcutaneous, Daily  . irbesartan (AVAPRO) 75 mg, Oral, Daily  . omeprazole (PRILOSEC) 20 mg, Oral, Daily PRN  . Oxycodone HCl 10 mg, Oral, Every 4 hours PRN  . potassium chloride (MICRO-K) 10 MEQ CR capsule 10 mEq, Oral, Daily  . thiamine 100 mg, Oral, Daily    Cardiac Studies:   Echocardiogram 04/14/2014:  Left ventricle cavity is normal in size. Normal global wall motion. Normal diastolic filling pattern. Visual EF is 55-60%. Calculated EF 51%. Trace mitral regurgitation. Trace tricuspid regurgitation. Trace pulmonic regurgitation. Essentially normal echocardiogram.  Lexiscan myoview stress test 04/13/2014: 1. The resting electrocardiogram demonstrated normal sinus rhythm, normal resting conduction, poor R progression, cannot r/o anterior infarct, old. No resting arrhythmias and normal rest repolarization.  Stress EKG is nondiagnostic for ischemia as it is a Pharmacologic stress test using Lexiscan infusion. Stress symptoms included dyspnea, dizziness. 2. Myocardial perfusion imaging is normal. Overall left  ventricular systolic function was normal without regional wall motion abnormalities. The left ventricular ejection fraction was 74%.  PV angio 05/01/2014: stenting of the right proximal to mid SFA with implantation of 2 overlapping 6.0 x 150 and a 6 x 100 mm Viabahn covered stents. One vessel R/O (PT) right leg. Mild disease left leg. Impression: Lower extremity arterial duplex 06/14/2016.  Lower extremity arterial duplex 06/14/2016: No hemodynamically significant stenoses  are identified in the lower extremity arterial system. This exam reveals mildly decreased perfusion of the  lower extremity, LABI 0.87 and RABI 0.90 withnormal triphasic wavefroms noted at the post tibial artery level.  Compared to the study done on 06/15/2015, no significant change, ABI mildly reduced from 0.95, may be due to technical error.  Right SFA stent appears patent.  Assessment     ICD-10-CM   1. PAD (peripheral artery disease) (HCC)  I73.9 Lipid Panel With LDL/HDL Ratio  2. Essential hypertension  I10 TSH    CMP14+EGFR  3. Tobacco use disorder, continuous  F17.209   4. Controlled type 2 diabetes mellitus with diabetic peripheral angiopathy without gangrene, without long-term current use of insulin (HCC)  E11.51 Hgb A1c w/o eAG  5. Dyspnea on exertion  R06.09 CBC    EKG 11/08/2017: Normal sinus rhythm at rate of 90 bpm, left axis deviation, left anterior fascicular. Anteroseptal infarct old. Low-voltage complexes. No evidence of ischemia. No significant change from EKG 08/50/2019  Recommendations:   Patient's symptoms of claudication remained stable. His underlying sinus tachycardia is due to underlying COPD, dead space ventilation. Smoking cessation again discussed. Dyspnea is stable.   I looked at his labs on care everywhere, I do not see any recent labs, hence I proceeded with placement of labs including CBC, CMP, lipid profile testing and A1c and will forward a copy to his PCP.  No bleeding diathesis on the  dapt, however in view of increased risk of bleeding, advised him to discontinue aspirin as he has been stable from cardiac standpoint and vascular standpoint.  Office visit in 6 months or sooner if problems.  Adrian Prows, MD, [facility] 11/11/2018, 9:33 AM [facility]. [place] Pager: [phone] Office: [phone] If no answer Cell [phone]

## 2018-11-21 DIAGNOSIS — E1151 Type 2 diabetes mellitus with diabetic peripheral angiopathy without gangrene: Secondary | ICD-10-CM | POA: Diagnosis not present

## 2018-11-21 DIAGNOSIS — I739 Peripheral vascular disease, unspecified: Secondary | ICD-10-CM | POA: Diagnosis not present

## 2018-11-21 DIAGNOSIS — R0609 Other forms of dyspnea: Secondary | ICD-10-CM | POA: Diagnosis not present

## 2018-11-21 DIAGNOSIS — I10 Essential (primary) hypertension: Secondary | ICD-10-CM | POA: Diagnosis not present

## 2018-11-22 LAB — CBC
Hematocrit: 47.7 % (ref 37.5–51.0)
Hemoglobin: 16.9 g/dL (ref 13.0–17.7)
MCH: 34.4 pg — ABNORMAL HIGH (ref 26.6–33.0)
MCHC: 35.4 g/dL (ref 31.5–35.7)
MCV: 97 fL (ref 79–97)
Platelets: 191 10*3/uL (ref 150–450)
RBC: 4.91 x10E6/uL (ref 4.14–5.80)
RDW: 11.6 % (ref 11.6–15.4)
WBC: 9.1 10*3/uL (ref 3.4–10.8)

## 2018-11-22 LAB — CMP14+EGFR
ALT: 51 IU/L — ABNORMAL HIGH (ref 0–44)
AST: 38 IU/L (ref 0–40)
Albumin/Globulin Ratio: 1.7 (ref 1.2–2.2)
Albumin: 4.6 g/dL (ref 3.8–4.8)
Alkaline Phosphatase: 87 IU/L (ref 39–117)
BUN/Creatinine Ratio: 13 (ref 10–24)
BUN: 17 mg/dL (ref 8–27)
Bilirubin Total: 0.5 mg/dL (ref 0.0–1.2)
CO2: 26 mmol/L (ref 20–29)
Calcium: 10 mg/dL (ref 8.6–10.2)
Chloride: 95 mmol/L — ABNORMAL LOW (ref 96–106)
Creatinine, Ser: 1.35 mg/dL — ABNORMAL HIGH (ref 0.76–1.27)
GFR calc Af Amer: 61 mL/min/{1.73_m2} (ref >59)
GFR calc non Af Amer: 53 mL/min/{1.73_m2} — ABNORMAL LOW (ref >59)
Globulin, Total: 2.7 g/dL (ref 1.5–4.5)
Glucose: 309 mg/dL — ABNORMAL HIGH (ref 65–99)
Potassium: 5.4 mmol/L — ABNORMAL HIGH (ref 3.5–5.2)
Sodium: 139 mmol/L (ref 134–144)
Total Protein: 7.3 g/dL (ref 6.0–8.5)

## 2018-11-22 LAB — HGB A1C W/O EAG: Hgb A1c MFr Bld: 7.9 % — ABNORMAL HIGH (ref 4.8–5.6)

## 2018-11-22 LAB — LIPID PANEL WITH LDL/HDL RATIO
Cholesterol, Total: 176 mg/dL (ref 100–199)
HDL: 102 mg/dL (ref >39)
LDL Calculated: 62 mg/dL (ref 0–99)
LDl/HDL Ratio: 0.6 ratio (ref 0.0–3.6)
Triglycerides: 60 mg/dL (ref 0–149)
VLDL Cholesterol Cal: 12 mg/dL (ref 5–40)

## 2018-11-22 LAB — TSH: TSH: 3.02 u[IU]/mL (ref 0.450–4.500)

## 2018-11-25 NOTE — Progress Notes (Signed)
Lipids well controlled. TSH stable. CBC stable. HgbA1c slightly up from before at 7.9%. Kidney function is stable, potassium level is borderline elevated. Watch potassium in diet.

## 2018-11-25 NOTE — Progress Notes (Signed)
S/w pt advised him of results advised him to watch his potassium, he agreed

## 2019-01-15 ENCOUNTER — Other Ambulatory Visit: Payer: Self-pay

## 2019-01-15 MED ORDER — ATORVASTATIN CALCIUM 40 MG PO TABS: 40.0000 mg | ORAL_TABLET | Freq: Every day | ORAL | 1 refills | Status: DC

## 2019-01-22 DIAGNOSIS — I129 Hypertensive chronic kidney disease with stage 1 through stage 4 chronic kidney disease, or unspecified chronic kidney disease: Secondary | ICD-10-CM | POA: Diagnosis not present

## 2019-01-22 DIAGNOSIS — E1151 Type 2 diabetes mellitus with diabetic peripheral angiopathy without gangrene: Secondary | ICD-10-CM | POA: Diagnosis not present

## 2019-01-22 DIAGNOSIS — Z794 Long term (current) use of insulin: Secondary | ICD-10-CM | POA: Diagnosis not present

## 2019-01-22 DIAGNOSIS — F17200 Nicotine dependence, unspecified, uncomplicated: Secondary | ICD-10-CM | POA: Diagnosis not present

## 2019-01-27 ENCOUNTER — Other Ambulatory Visit: Payer: Self-pay | Admitting: Cardiology

## 2019-01-27 DIAGNOSIS — I70201 Unspecified atherosclerosis of native arteries of extremities, right leg: Secondary | ICD-10-CM

## 2019-02-27 DIAGNOSIS — Z23 Encounter for immunization: Secondary | ICD-10-CM | POA: Diagnosis not present

## 2019-02-27 DIAGNOSIS — Z0000 Encounter for general adult medical examination without abnormal findings: Secondary | ICD-10-CM | POA: Diagnosis not present

## 2019-02-27 DIAGNOSIS — J449 Chronic obstructive pulmonary disease, unspecified: Secondary | ICD-10-CM | POA: Diagnosis not present

## 2019-02-27 DIAGNOSIS — E785 Hyperlipidemia, unspecified: Secondary | ICD-10-CM | POA: Diagnosis not present

## 2019-02-27 DIAGNOSIS — I10 Essential (primary) hypertension: Secondary | ICD-10-CM | POA: Diagnosis not present

## 2019-02-27 DIAGNOSIS — I739 Peripheral vascular disease, unspecified: Secondary | ICD-10-CM | POA: Diagnosis not present

## 2019-05-09 ENCOUNTER — Ambulatory Visit: Payer: Medicare Other | Admitting: Cardiology

## 2019-05-15 ENCOUNTER — Ambulatory Visit: Payer: Medicare Other | Admitting: Cardiology

## 2019-06-04 ENCOUNTER — Ambulatory Visit: Payer: Medicare Other | Admitting: Cardiology

## 2019-06-04 ENCOUNTER — Other Ambulatory Visit: Payer: Self-pay

## 2019-06-04 ENCOUNTER — Encounter: Payer: Self-pay | Admitting: Cardiology

## 2019-06-04 VITALS — BP 132/77 | HR 102 | Temp 98.8°F | Ht 68.0 in | Wt 163.0 lb

## 2019-06-04 DIAGNOSIS — I739 Peripheral vascular disease, unspecified: Secondary | ICD-10-CM

## 2019-06-04 DIAGNOSIS — I10 Essential (primary) hypertension: Secondary | ICD-10-CM

## 2019-06-04 DIAGNOSIS — E7800 Pure hypercholesterolemia, unspecified: Secondary | ICD-10-CM

## 2019-06-04 DIAGNOSIS — R0609 Other forms of dyspnea: Secondary | ICD-10-CM

## 2019-06-04 NOTE — Progress Notes (Signed)
Primary Physician/Referring:  Aletha Halim., PA-C  Patient ID: [name], male    DOB: 04/26/1950, 69 y.o.   MRN: [id number]  CC: Claudication and hypertension  HPI:    ELZA ORN  is a 69 y.o. Caucasian male with greater than 100-pack-year history of smoking, hyperlipidemia, hyper, diabetes mellitus, peripheral arterial disease with right SFA stenting in 2016 with Viabahn covered stents, here for 6 month follow-up of PAD and hypertension.   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  Has chronic cough and dyspnea.. He still smoking about one pack of cigarettes a day.   Past Medical History:  Diagnosis Date  . COPD (chronic obstructive pulmonary disease) ([place])   . Diabetes mellitus without complication ([place])    INSULIN DEPENDENT  . Hypertension   . Hyponatremia 03/2014  . Insomnia   . Kidney stones   . Mouth ulcers   . Osteoarthritis   . Otitis media   . Peripheral vascular disease ([place])   . Sinusitis    Past Surgical History:  Procedure Laterality Date  . DENTAL SURGERY    . FEMORAL-POPLITEAL BYPASS GRAFT Right 07/24/2015   Procedure: Thrombectomy Right Femoral-Popliteal Artery; Thrombectomy Right Anterior-Tibial Artery, Thrombectomy Right Tibial-Peroneal Trunk; Bovine Patch Angioplasty Right Popliteal Artery; Endarterectomy Right Anterior Tibial Artery and Right Tibial-Peroneal Trunk; Right Leg Angiogram, Four Compartment Fasciotomy Right Lower Leg;  Surgeon: Conrad Moniteau, MD;  Location: [facility];  Service: Vascular;  L  . HERNIA REPAIR    . lithotomy    . LOWER EXTREMITY ANGIOGRAM N/A 05/01/2014   Procedure: LOWER EXTREMITY ANGIOGRAM;  Surgeon: Laverda Page, MD;  Location: [facility] CATH LAB;  Service: Cardiovascular;  Laterality: N/A;  . NECK SURGERY    . POPLITEAL ARTERY ANGIOPLASTY Right 05/02/2014   to mid sfa  .  THROMBECTOMY  05/02/2014   Social History   Socioeconomic History  . Marital status: Married    Spouse name: Not on file  . Number of children: 1  . Years of education: Not on file  . Highest education level: Not on file  Occupational History  . Not on file  Tobacco Use  . Smoking status: Current Every Day Smoker    Packs/day: 2.00    Years: 45.00    Pack years: 90.00    Types: Cigarettes  . Smokeless tobacco: Never Used  Substance and Sexual Activity  . Alcohol use: Yes    Alcohol/week: 24.0 standard drinks    Types: 24 Cans of beer per week    Comment: daily  . Drug use: No  . Sexual activity: Not on file  Other Topics Concern  . Not on file  Social History Narrative  . Not on file   Social Determinants of Health   Financial Resource Strain:   . Difficulty of Paying Living Expenses: Not on file  Food Insecurity:   . Worried About Charity fundraiser in the Last Year: Not on file  . Ran Out of Food in the Last Year: Not on file  Transportation Needs:   . Lack of Transportation (Medical): Not on file  . Lack of Transportation (Non-Medical): Not on file  Physical Activity:   . Days of Exercise per Week: Not on file  . Minutes of Exercise per Session: Not on file  Stress:   .  Feeling of Stress : Not on file  Social Connections:   . Frequency of Communication with Friends and Family: Not on file  . Frequency of Social Gatherings with Friends and Family: Not on file  . Attends Religious Services: Not on file  . Active Member of Clubs or Organizations: Not on file  . Attends Archivist Meetings: Not on file  . Marital Status: Not on file  Intimate Partner Violence:   . Fear of Current or Ex-Partner: Not on file  . Emotionally Abused: Not on file  . Physically Abused: Not on file  . Sexually Abused: Not on file   ROS  Review of Systems  Cardiovascular: Positive for claudication (minimal). Negative for chest pain and leg swelling.  Respiratory:  Positive for cough, shortness of breath and wheezing.   Gastrointestinal: Negative for melena.   Objective  Blood pressure 132/77, pulse (!) 102, temperature 98.8 F (37.1 C), height 5\' 8"  (1.727 m), weight 163 lb (73.9 kg), SpO2 98 %. Body mass index is 24.78 kg/m.   Physical Exam  Constitutional: He appears well-developed and well-nourished. No distress.  Neck: No JVD present.  Cardiovascular: Normal rate, regular rhythm and normal heart sounds. Exam reveals no gallop.  No murmur heard. Bilateral loss of hair present, pigmented, no varicose veins.  Femoral pulses bilateral 2+, popliteal pulse 1+ bilateral, DP absent bilateral, PT left 1+, right absent.   No carotid bruit. No leg edema.   No JVD.  Pulmonary/Chest: Effort normal. He has rales (Bilateral bronchovesicular breath sounds with occasinal rales bilateral).  Barrel-shaped chest.  Abdominal: Soft. Bowel sounds are normal.  Reducible umbilical hernia present.   Radiology: No results found.  Laboratory examination:   Recent Labs    11/21/18 0831  NA 139  K 5.4*  CL 95*  CO2 26  GLUCOSE 309*  BUN 17  CREATININE 1.35*  CALCIUM 10.0  GFRNONAA 53*  GFRAA 61   CMP Latest Ref Rng & Units 11/21/2018 09/02/2015 07/25/2015  Glucose 65 - 99 mg/dL 309(H) 130(H) 108(H)  BUN 8 - 27 mg/dL 17 17 11   Creatinine 0.76 - 1.27 mg/dL 1.35(H) 1.29(H) 1.11  Sodium 134 - 144 mmol/L 139 134(L) 136  Potassium 3.5 - 5.2 mmol/L 5.4(H) 4.3 3.8  Chloride 96 - 106 mmol/L 95(L) 99(L) 101  CO2 20 - 29 mmol/L 26 23 26   Calcium 8.6 - 10.2 mg/dL 10.0 9.6 8.3(L)  Total Protein 6.0 - 8.5 g/dL 7.3 7.8 -  Total Bilirubin 0.0 - 1.2 mg/dL 0.5 0.7 -  Alkaline Phos 39 - 117 IU/L 87 97 -  AST 0 - 40 IU/L 38 25 -  ALT 0 - 44 IU/L 51(H) 24 -   CBC Latest Ref Rng & Units 11/21/2018 09/02/2015 07/28/2015  WBC 3.4 - 10.8 x10E3/uL 9.1 15.4(H) 7.7  Hemoglobin 13.0 - 17.7 g/dL 16.9 13.9 10.7(L)  Hematocrit 37.5 - 51.0 % 47.7 41.4 31.0(L)  Platelets 150 - 450  x10E3/uL 191 216 174   Lipid Panel     Component Value Date/Time   CHOL 176 11/21/2018 0831   TRIG 60 11/21/2018 0831   HDL 102 11/21/2018 0831   CHOLHDL 2.2 04/20/2014 0414   VLDL 11 04/20/2014 0414   LDLCALC 62 11/21/2018 0831   HEMOGLOBIN A1C Lab Results  Component Value Date   HGBA1C 7.9 (H) 11/21/2018   MPG 169 07/24/2015   TSH Recent Labs    11/21/18 0831  TSH 3.020   External Labs:   Cholesterol, total 176.000  11/21/2018 HDL 102.000 11/21/2018 LDL 62.000 11/21/2018 Triglycerides 60.000 11/21/2018  A1C 8.100 % 04/29/2019  Hemoglobin 16.900 G/ 11/21/2018; Platelets 191.000 11/21/2018  Creatinine, Serum 1.350 11/21/2018 Potassium 5.400 11/21/2018 ALT (SGPT) 51.000 11/21/2018 TSH 3.020 11/21/2018  Medications   Current Outpatient Medications  Medication Instructions  . albuterol (PROVENTIL HFA;VENTOLIN HFA) 108 (90 Base) MCG/ACT inhaler 1-2 puffs, Inhalation, Every 6 hours PRN  . albuterol (PROVENTIL) 2.5 mg, Every 6 hours PRN  . amitriptyline (ELAVIL) 50 mg, Daily at bedtime  . atorvastatin (LIPITOR) 40 mg, Oral, Daily  . budesonide-formoterol (SYMBICORT) 160-4.5 MCG/ACT inhaler 2 puffs, Inhalation, 2 times daily  . clopidogrel (PLAVIX) 75 MG tablet TAKE 1 TABLET DAILY  . GLYXAMBI 10-5 MG TABS No dose, route, or frequency recorded.  . insulin glargine (LANTUS) 36 Units, Subcutaneous, Daily  . irbesartan (AVAPRO) 75 mg, Oral, Daily  . omeprazole (PRILOSEC) 20 mg, Oral, Daily PRN    Cardiac Studies:   Echocardiogram 04/14/2014:  Left ventricle cavity is normal in size. Normal global wall motion. Normal diastolic filling pattern. Visual EF is 55-60%. Calculated EF 51%. Trace mitral regurgitation. Trace tricuspid regurgitation. Trace pulmonic regurgitation. Essentially normal echocardiogram.  Lexiscan myoview stress test 04/13/2014: 1. The resting electrocardiogram demonstrated normal sinus rhythm, normal resting conduction, poor R progression, cannot r/o anterior  infarct, old. No resting arrhythmias and normal rest repolarization.  Stress EKG is nondiagnostic for ischemia as it is a Pharmacologic stress test using Lexiscan infusion. Stress symptoms included dyspnea, dizziness. 2. Myocardial perfusion imaging is normal. Overall left ventricular systolic function was normal without regional wall motion abnormalities. The left ventricular ejection fraction was 74%.  PV angio 05/01/2014: stenting of the right proximal to mid SFA with implantation of 2 overlapping 6.0 x 150 and a 6 x 100 mm Viabahn covered stents. One vessel R/O (PT) right leg. Mild disease left leg. Impression: Lower extremity arterial duplex 06/14/2016.  Lower extremity arterial duplex 06/14/2016: No hemodynamically significant stenoses are identified in the lower extremity arterial system. This exam reveals mildly decreased perfusion of the  lower extremity, LABI 0.87 and RABI 0.90 withnormal triphasic wavefroms noted at the post tibial artery level.  Compared to the study done on 06/15/2015, no significant change, ABI mildly reduced from 0.95, may be due to technical error.  Right SFA stent appears patent.  EKG:  EKG 06/04/2019: Sinus tachycardia at the rate of 100 bpm, left axis deviation, left anterior fascicular block.  Poor R wave progression, cannot exclude anteroseptal infarct old.  Low-voltage complexes.  Single PVC.  No evidence of ischemia.   -possible pulmonary disease.  No significant change from  EKG 11/08/2017.  Assessment     ICD-10-CM   1. Essential hypertension  I10 EKG 12-Lead  2. Dyspnea on exertion  R06.00   3. PAD (peripheral artery disease) (HCC)  I73.9   4. Hypercholesteremia  E78.00     Recommendations:   SECUNDINO HALDANE  is a 69 y.o. Caucasian male with greater than 100-pack-year history of smoking, hyperlipidemia, hyper, diabetes mellitus, peripheral arterial disease with right SFA stenting in 2016 with Viabahn covered stents, here for 6 month follow-up of PAD  and hypertension.   Patient's symptoms of claudication remained stable. His underlying sinus tachycardia is due to underlying COPD, dead space ventilation. Smoking cessation again discussed. Dyspnea is stable. No clinical CHF.   No bleeding diathesis on the plavix,  he has been stable from cardiac standpoint and vascular standpoint.  Office visit in 1 year or sooner if problems.  External labs reviewed and lipids controlled, BP controlled, DM is uncontrolled and discussed and encouraged him to decrease starch intake.   Adrian Prows, MD, [facility] 06/04/2019, 3:45 PM [facility]. [facility] Office: [phone]

## 2019-06-06 ENCOUNTER — Ambulatory Visit: Payer: Medicare Other | Admitting: Cardiology

## 2019-07-01 ENCOUNTER — Other Ambulatory Visit: Payer: Self-pay | Admitting: Cardiology

## 2019-10-03 ENCOUNTER — Encounter (HOSPITAL_COMMUNITY): Payer: Self-pay | Admitting: Emergency Medicine

## 2019-10-03 ENCOUNTER — Emergency Department (HOSPITAL_COMMUNITY)
Admission: EM | Admit: 2019-10-03 | Discharge: 2019-10-03 | Disposition: A | Discharge status: Home / Self Care | Payer: Medicare Other | Source: Home / Self Care | Attending: Emergency Medicine | Admitting: Emergency Medicine

## 2019-10-03 ENCOUNTER — Emergency Department (HOSPITAL_COMMUNITY): Payer: Medicare Other

## 2019-10-03 ENCOUNTER — Other Ambulatory Visit: Payer: Self-pay

## 2019-10-03 ENCOUNTER — Inpatient Hospital Stay (HOSPITAL_COMMUNITY)
Admission: EM | Admit: 2019-10-03 | Discharge: 2019-10-05 | DRG: 084 | Disposition: A | Discharge status: Home Health | Payer: Medicare Other | Attending: Family Medicine | Admitting: Family Medicine

## 2019-10-03 DIAGNOSIS — E119 Type 2 diabetes mellitus without complications: Secondary | ICD-10-CM

## 2019-10-03 DIAGNOSIS — M1990 Unspecified osteoarthritis, unspecified site: Secondary | ICD-10-CM | POA: Diagnosis present

## 2019-10-03 DIAGNOSIS — W010XXA Fall on same level from slipping, tripping and stumbling without subsequent striking against object, initial encounter: Secondary | ICD-10-CM | POA: Insufficient documentation

## 2019-10-03 DIAGNOSIS — Z8673 Personal history of transient ischemic attack (TIA), and cerebral infarction without residual deficits: Secondary | ICD-10-CM

## 2019-10-03 DIAGNOSIS — I619 Nontraumatic intracerebral hemorrhage, unspecified: Secondary | ICD-10-CM | POA: Diagnosis present

## 2019-10-03 DIAGNOSIS — R4781 Slurred speech: Secondary | ICD-10-CM | POA: Diagnosis present

## 2019-10-03 DIAGNOSIS — E785 Hyperlipidemia, unspecified: Secondary | ICD-10-CM | POA: Diagnosis present

## 2019-10-03 DIAGNOSIS — Z981 Arthrodesis status: Secondary | ICD-10-CM

## 2019-10-03 DIAGNOSIS — Z7951 Long term (current) use of inhaled steroids: Secondary | ICD-10-CM

## 2019-10-03 DIAGNOSIS — Z794 Long term (current) use of insulin: Secondary | ICD-10-CM

## 2019-10-03 DIAGNOSIS — Y999 Unspecified external cause status: Secondary | ICD-10-CM | POA: Insufficient documentation

## 2019-10-03 DIAGNOSIS — R0781 Pleurodynia: Secondary | ICD-10-CM | POA: Insufficient documentation

## 2019-10-03 DIAGNOSIS — I2510 Atherosclerotic heart disease of native coronary artery without angina pectoris: Secondary | ICD-10-CM | POA: Diagnosis present

## 2019-10-03 DIAGNOSIS — S06309A Unspecified focal traumatic brain injury with loss of consciousness of unspecified duration, initial encounter: Principal | ICD-10-CM | POA: Diagnosis present

## 2019-10-03 DIAGNOSIS — R296 Repeated falls: Secondary | ICD-10-CM | POA: Diagnosis present

## 2019-10-03 DIAGNOSIS — I10 Essential (primary) hypertension: Secondary | ICD-10-CM | POA: Diagnosis present

## 2019-10-03 DIAGNOSIS — W19XXXA Unspecified fall, initial encounter: Secondary | ICD-10-CM

## 2019-10-03 DIAGNOSIS — Y929 Unspecified place or not applicable: Secondary | ICD-10-CM | POA: Insufficient documentation

## 2019-10-03 DIAGNOSIS — Z720 Tobacco use: Secondary | ICD-10-CM | POA: Diagnosis present

## 2019-10-03 DIAGNOSIS — Z79899 Other long term (current) drug therapy: Secondary | ICD-10-CM | POA: Insufficient documentation

## 2019-10-03 DIAGNOSIS — F5104 Psychophysiologic insomnia: Secondary | ICD-10-CM | POA: Diagnosis present

## 2019-10-03 DIAGNOSIS — I629 Nontraumatic intracranial hemorrhage, unspecified: Secondary | ICD-10-CM | POA: Diagnosis not present

## 2019-10-03 DIAGNOSIS — Z7902 Long term (current) use of antithrombotics/antiplatelets: Secondary | ICD-10-CM

## 2019-10-03 DIAGNOSIS — D72829 Elevated white blood cell count, unspecified: Secondary | ICD-10-CM | POA: Diagnosis present

## 2019-10-03 DIAGNOSIS — R569 Unspecified convulsions: Secondary | ICD-10-CM

## 2019-10-03 DIAGNOSIS — Z79891 Long term (current) use of opiate analgesic: Secondary | ICD-10-CM

## 2019-10-03 DIAGNOSIS — Y9339 Activity, other involving climbing, rappelling and jumping off: Secondary | ICD-10-CM | POA: Insufficient documentation

## 2019-10-03 DIAGNOSIS — Z87442 Personal history of urinary calculi: Secondary | ICD-10-CM

## 2019-10-03 DIAGNOSIS — R0789 Other chest pain: Secondary | ICD-10-CM | POA: Diagnosis present

## 2019-10-03 DIAGNOSIS — Z833 Family history of diabetes mellitus: Secondary | ICD-10-CM

## 2019-10-03 DIAGNOSIS — F17210 Nicotine dependence, cigarettes, uncomplicated: Secondary | ICD-10-CM | POA: Insufficient documentation

## 2019-10-03 DIAGNOSIS — Z20822 Contact with and (suspected) exposure to covid-19: Secondary | ICD-10-CM | POA: Diagnosis present

## 2019-10-03 DIAGNOSIS — R030 Elevated blood-pressure reading, without diagnosis of hypertension: Secondary | ICD-10-CM

## 2019-10-03 DIAGNOSIS — R509 Fever, unspecified: Secondary | ICD-10-CM | POA: Diagnosis not present

## 2019-10-03 DIAGNOSIS — R911 Solitary pulmonary nodule: Secondary | ICD-10-CM | POA: Diagnosis present

## 2019-10-03 DIAGNOSIS — J449 Chronic obstructive pulmonary disease, unspecified: Secondary | ICD-10-CM | POA: Diagnosis present

## 2019-10-03 DIAGNOSIS — E162 Hypoglycemia, unspecified: Secondary | ICD-10-CM | POA: Diagnosis not present

## 2019-10-03 DIAGNOSIS — Z882 Allergy status to sulfonamides status: Secondary | ICD-10-CM

## 2019-10-03 DIAGNOSIS — E1151 Type 2 diabetes mellitus with diabetic peripheral angiopathy without gangrene: Secondary | ICD-10-CM | POA: Diagnosis present

## 2019-10-03 DIAGNOSIS — J441 Chronic obstructive pulmonary disease with (acute) exacerbation: Secondary | ICD-10-CM | POA: Insufficient documentation

## 2019-10-03 DIAGNOSIS — Z8249 Family history of ischemic heart disease and other diseases of the circulatory system: Secondary | ICD-10-CM

## 2019-10-03 DIAGNOSIS — R079 Chest pain, unspecified: Secondary | ICD-10-CM | POA: Diagnosis not present

## 2019-10-03 LAB — CBC
HCT: 48.6 % (ref 39.0–52.0)
Hemoglobin: 16.5 g/dL (ref 13.0–17.0)
MCH: 34.2 pg — ABNORMAL HIGH (ref 26.0–34.0)
MCHC: 34 g/dL (ref 30.0–36.0)
MCV: 100.6 fL — ABNORMAL HIGH (ref 80.0–100.0)
Platelets: 185 10*3/uL (ref 150–400)
RBC: 4.83 MIL/uL (ref 4.22–5.81)
RDW: 13.1 % (ref 11.5–15.5)
WBC: 12.5 10*3/uL — ABNORMAL HIGH (ref 4.0–10.5)
nRBC: 0 % (ref 0.0–0.2)

## 2019-10-03 LAB — COMPREHENSIVE METABOLIC PANEL
ALT: 36 U/L (ref 0–44)
AST: 40 U/L (ref 15–41)
Albumin: 3.9 g/dL (ref 3.5–5.0)
Alkaline Phosphatase: 78 U/L (ref 38–126)
Anion gap: 12 (ref 5–15)
BUN: 13 mg/dL (ref 8–23)
CO2: 27 mmol/L (ref 22–32)
Calcium: 9.1 mg/dL (ref 8.9–10.3)
Chloride: 98 mmol/L (ref 98–111)
Creatinine, Ser: 1.24 mg/dL (ref 0.61–1.24)
GFR calc Af Amer: >60 mL/min (ref >60)
GFR calc non Af Amer: 59 mL/min — ABNORMAL LOW (ref >60)
Glucose, Bld: 81 mg/dL (ref 70–99)
Potassium: 3.9 mmol/L (ref 3.5–5.1)
Sodium: 137 mmol/L (ref 135–145)
Total Bilirubin: 0.9 mg/dL (ref 0.3–1.2)
Total Protein: 7.3 g/dL (ref 6.5–8.1)

## 2019-10-03 LAB — APTT: aPTT: 32 seconds (ref 24–36)

## 2019-10-03 LAB — I-STAT CHEM 8, ED
BUN: 12 mg/dL (ref 8–23)
Calcium, Ion: 1.15 mmol/L (ref 1.15–1.40)
Chloride: 97 mmol/L — ABNORMAL LOW (ref 98–111)
Creatinine, Ser: 1.3 mg/dL — ABNORMAL HIGH (ref 0.61–1.24)
Glucose, Bld: 82 mg/dL (ref 70–99)
HCT: 48 % (ref 39.0–52.0)
Hemoglobin: 16.3 g/dL (ref 13.0–17.0)
Potassium: 4.1 mmol/L (ref 3.5–5.1)
Sodium: 138 mmol/L (ref 135–145)
TCO2: 28 mmol/L (ref 22–32)

## 2019-10-03 LAB — PROTIME-INR
INR: 1 (ref 0.8–1.2)
Prothrombin Time: 12.4 seconds (ref 11.4–15.2)

## 2019-10-03 LAB — SAMPLE TO BLOOD BANK

## 2019-10-03 LAB — LACTIC ACID, PLASMA: Lactic Acid, Venous: 1.9 mmol/L (ref 0.5–1.9)

## 2019-10-03 LAB — SARS CORONAVIRUS 2 BY RT PCR (HOSPITAL ORDER, PERFORMED IN ~~LOC~~ HOSPITAL LAB): SARS Coronavirus 2: NEGATIVE

## 2019-10-03 MED ORDER — HYDROCODONE-ACETAMINOPHEN 5-325 MG PO TABS: 1.0000 | ORAL_TABLET | Freq: Four times a day (QID) | ORAL | 0 refills | Status: DC | PRN

## 2019-10-03 MED ORDER — FENTANYL CITRATE (PF) 100 MCG/2ML IJ SOLN
50.0000 ug | Freq: Once | INTRAMUSCULAR | Status: AC
  Administered 2019-10-03: 50 ug via INTRAVENOUS
  Filled 2019-10-03: qty 2

## 2019-10-03 MED ORDER — IOHEXOL 300 MG/ML  SOLN
100.0000 mL | Freq: Once | INTRAMUSCULAR | Status: AC | PRN
  Administered 2019-10-03: 100 mL via INTRAVENOUS

## 2019-10-03 MED ORDER — SODIUM CHLORIDE 0.9 % IV BOLUS
500.0000 mL | Freq: Once | INTRAVENOUS | Status: AC
  Administered 2019-10-03: 500 mL via INTRAVENOUS

## 2019-10-03 NOTE — Discharge Instructions (Addendum)
At this time there does not appear to be the presence of an emergent medical condition, however there is always the potential for conditions to change. Please read and follow the below instructions.  Please return to the Emergency Department immediately for any new or worsening symptoms. Please be sure to follow up with your Primary Care Provider within one week regarding your visit today; please call their office to schedule an appointment even if you are feeling better for a follow-up visit. Your CT scan today showed 7 mm subpleural lung nodule in your left lower lobe.  This will need a follow-up CT scan for further evaluation in 6-12 months.  Please discuss this finding with your primary care provider at your follow-up visit this week and schedule your CT scan.  Also your CT scan today showed coronary artery calcifications which should be followed up by your primary care doctor as well. Other incidental findings seen on your imaging today were aortic atherosclerosis, emphysema, chronic bilateral periventricular white matter small vessel ischemic changes, calcified splenic granulomata.  Please discuss all of your CT scan findings with your primary care provider at your follow-up visit. Use the incentive spirometer ordered to you today to help take deep breaths to avoid development of pneumonia. Your blood pressure was high in the emergency department today.  Please take your blood pressure medication as prescribed by your  primary care doctor and have your blood pressure rechecked this week and discuss medication management at that time. You may take the medication Norco (Hydrocodone/Acetaminophen) as prescribed to help with severe pain.  This medication will make you drowsy so do not drive, drink alcohol, take other sedating medications or perform any dangerous activities such as driving after taking Norco. Norco contains Tylenol (acetaminophen) so do not take any other Tylenol-containing products with  Norco.   Get help right away if: You feel sick to your stomach (nauseous) or you throw up (vomit). You feel sweaty or light-headed. You have a cough with mucus from your lungs (sputum) or you cough up blood. You are short of breath. You have a headache You have any new/concerning or worsening of symptoms  Please read the additional information packets attached to your discharge summary.  Do not take your medicine if  develop an itchy rash, swelling in your mouth or lips, or difficulty breathing; call 911 and seek immediate emergency medical attention if this occurs.  You may review your lab tests and imaging results in their entirety on your MyChart account.  Please discuss all results of fully with your primary care provider and other specialist at your follow-up visit.  Note: Portions of this text may have been transcribed using voice recognition software. Every effort was made to ensure accuracy; however, inadvertent computerized transcription errors may still be present.

## 2019-10-03 NOTE — ED Triage Notes (Signed)
Patient returned back per doctor instructions for a intercranial hemorrhage. Patient here in the ED earlier and results confirmed by CT scan. Patient is on blood thinners. Patient alert and oriented. Patient complains of a slight headache at this at  time.

## 2019-10-03 NOTE — ED Triage Notes (Signed)
Patient tried to jump off the tailgate of his parked truck and he fell on his right side. Patient was ambulatory when EMS arrived. Pt received 4mg  of morphine in route. Pt c/o right rib pain and has been placed in a cervical collar by EMS.

## 2019-10-03 NOTE — H&P (Signed)
History and Physical    Terry Munoz:[id number] DOB: August 28, 1950 DOA: 10/03/2019  PCP: Aletha Halim., PA-C   Patient coming from: Home.   I have personally briefly reviewed patient's old medical records in [place]  Chief Complaint: Fall.  HPI: Terry Munoz is a 69 y.o. male with medical history significant of COPD, type 2 diabetes, hypertension, hyponatremia, chronic insomnia, urolithiasis, history of mouth ulcers, osteoarthritis, history of otitis media, history of sinusitis, PVD who was brought to the emergency department via EMS after having a fall, hitting his right sided chest wall and head while trying to jump off the tailgate of his parked truck.  He complains of right-sided chest wall tenderness, but denies current headache, earache, rhinorrhea, sore throat, dyspnea, wheezing or hemoptysis.  He occasionally has a smoker cough, which is occasionally productive.  No palpitations, dizziness, diaphoresis, PND, orthopnea or pitting edema of the lower extremities.  Denies abdominal pain, nausea, emesis, diarrhea, constipation, melena or hematochezia.  No dysuria, frequency or hematuria.  No polyuria polydipsia, polyphagia or blurred vision.  ED Course: Initial vital signs were temperature 98.4 F, pulse 107, respirations 18, blood pressure 155/82 mmHg and O2 sat 92% on room air.  The case was discussed with neurosurgery who recommended overnight observation and repeat imaging only if neurological status worsens.  CBC showed a white count of 12.5, hemoglobin of 16.5 g/dL and platelets 185.  PT was 12.4 seconds and INR 1.0.  PTT 32 seconds.  CMP was unremarkable.  Imaging: 1 view chest radiograph shows bibasilar reticulonodular infiltrates suspicious for atypical infection.  Pelvis x-rays no acute fracture or dislocation.  CT abdomen/pelvis show coronary artery calcifications suggesting CAD.  There was 7 mm subpleural nodule posteriorly in the LLL. Noncontrast chest CT  recommended in 6 to 12 months. C-spine CT did not show acute fracture or dislocation of the C-spine. CT head there is acute hemorrhage layering dependently in the occipital horn of the left lateral ventricle.  There was no evidence of intraparenchymal hemorrhage or subarachnoid blood elsewhere. Please see images and full radiology report for further detail.  Review of Systems: As per HPI otherwise all other systems reviewed and are negative.  Past Medical History:  Diagnosis Date  . COPD (chronic obstructive pulmonary disease) ([place])   . Diabetes mellitus without complication ([place])    INSULIN DEPENDENT  . Hypertension   . Hyponatremia 03/2014  . Insomnia   . Kidney stones   . Mouth ulcers   . Osteoarthritis   . Otitis media   . Peripheral vascular disease ([place])   . Sinusitis    Past Surgical History:  Procedure Laterality Date  . DENTAL SURGERY    . FEMORAL-POPLITEAL BYPASS GRAFT Right 07/24/2015   Procedure: Thrombectomy Right Femoral-Popliteal Artery; Thrombectomy Right Anterior-Tibial Artery, Thrombectomy Right Tibial-Peroneal Trunk; Bovine Patch Angioplasty Right Popliteal Artery; Endarterectomy Right Anterior Tibial Artery and Right Tibial-Peroneal Trunk; Right Leg Angiogram, Four Compartment Fasciotomy Right Lower Leg;  Surgeon: Conrad Nyssa, MD;  Location: [employer] [facility];  Service: Vascular;  L  . HERNIA REPAIR    . lithotomy    . LOWER EXTREMITY ANGIOGRAM N/A 05/01/2014   Procedure: LOWER EXTREMITY ANGIOGRAM;  Surgeon: Laverda Page, MD;  Location: [facility] CATH LAB;  Service: Cardiovascular;  Laterality: N/A;  . NECK SURGERY    . POPLITEAL ARTERY ANGIOPLASTY Right 05/02/2014   to mid sfa  . THROMBECTOMY  05/02/2014   Social History  reports that he has been smoking cigarettes. He  has a 90.00 pack-year smoking history. He has never used smokeless tobacco. He reports current alcohol use of about 24.0 standard drinks of alcohol per week. He reports that he does not use drugs.  Allergies    Allergen Reactions  . Sulfa Antibiotics Hives    Family History  Problem Relation Age of Onset  . Heart disease Mother   . Hypertension Mother   . Hypertension Father   . Hypertension Sister   . Hypertension Brother   . Diabetes Brother   . Hypertension Sister    Prior to Admission medications   Medication Sig Start Date End Date Taking? Authorizing Provider  albuterol (PROVENTIL HFA;VENTOLIN HFA) 108 (90 Base) MCG/ACT inhaler Inhale 1-2 puffs into the lungs every 6 (six) hours as needed for wheezing or shortness of breath.   Yes [provider]  albuterol (PROVENTIL) (2.5 MG/3ML) 0.083% nebulizer solution Take 2.5 mg by nebulization every 6 (six) hours as needed for wheezing or shortness of breath.    Yes [provider]  amitriptyline (ELAVIL) 50 MG tablet Take 50 mg by mouth at bedtime.   Yes [provider]  atorvastatin (LIPITOR) 40 MG tablet TAKE 1 TABLET DAILY Patient taking differently: Take 40 mg by mouth daily.  07/01/19  Yes Adrian Prows, MD  budesonide-formoterol [facility]) 160-4.5 MCG/ACT inhaler Inhale 2 puffs into the lungs daily as needed (for shortness of breath).    Yes [provider]  clopidogrel (PLAVIX) 75 MG tablet TAKE 1 TABLET DAILY Patient taking differently: Take 75 mg by mouth in the morning.  01/27/19  Yes Adrian Prows, MD  GLYXAMBI 10-5 MG TABS Take 1 tablet by mouth in the morning.  04/09/19  Yes [provider]  HYDROcodone-acetaminophen (NORCO/VICODIN) 5-325 MG tablet Take 1 tablet by mouth every 6 (six) hours as needed. Patient taking differently: Take 1 tablet by mouth every 6 (six) hours as needed for moderate pain.  10/03/19  Yes Nuala Alpha A, PA-C  insulin glargine (LANTUS) 100 UNIT/ML injection Inject 46 Units into the skin daily after breakfast.    Yes [provider]  insulin lispro (HUMALOG) 100 UNIT/ML injection Inject 3 Units into the skin 3 (three) times daily after meals.   Yes [provider]  irbesartan (AVAPRO) 75 MG tablet Take 1 tablet (75 mg total) by mouth daily. 04/24/14  Yes Geradine Girt, DO  omeprazole (PRILOSEC) 20 MG capsule Take 20 mg by mouth daily as needed (heartburn).   Yes [provider]  thiamine (VITAMIN B-1) 50 MG tablet Take 150 mg by mouth daily.   Yes [provider]   Physical Exam: Vitals:   10/03/19 1939 10/03/19 2000 10/03/19 2030 10/03/19 2230  BP:  128/70 139/83 (!) 142/78  Pulse:  99 99   Resp:  17 19 18   Temp:      TempSrc:      SpO2:  (!) 89% (!) 89% 96%  Weight: 73 kg     Height: 5\' 8"  (1.727 m)      Constitutional: NAD, calm, comfortable Eyes: PERRL, lids and conjunctivae normal ENMT: Mucous membranes are moist. Posterior pharynx clear of any exudate or lesions. Neck: normal, supple, no masses, no thyromegaly Respiratory: clear to auscultation bilaterally, no wheezing, no crackles. Normal respiratory effort. No accessory muscle use.   Chest wall: Positive tenderness on right sided chest wall. Cardiovascular: Regular rate and rhythm, no murmurs / rubs / gallops. No extremity edema. 2+ pedal pulses. No carotid bruits.  Abdomen: Nondistended.  BS positive.  Soft, no tenderness, no masses palpated. No hepatosplenomegaly. Musculoskeletal: no clubbing / cyanosis.  Overall good ROM, no contractures. Normal muscle tone.  Skin: no major rashes, lesions, ulcers on very limited dermatological examination. Neurologic: CN 2-12 grossly intact. Sensation intact, DTR normal. Strength 5/5 in all 4.  Psychiatric: Normal judgment and insight. Alert and oriented x 3. Normal mood.   Labs on Admission: I have personally reviewed following labs and imaging studies  CBC: Recent Labs  Lab 10/03/19 1309 10/03/19 1318  WBC 12.5*  --   HGB 16.5 16.3  HCT 48.6 48.0  MCV 100.6*  --   PLT 185  --     Basic Metabolic Panel: Recent Labs  Lab 10/03/19 1309 10/03/19 1318  NA 137 138  K 3.9 4.1  CL 98 97*  CO2 27  --     GLUCOSE 81 82  BUN 13 12  CREATININE 1.24 1.30*  CALCIUM 9.1  --     GFR: Estimated Creatinine Clearance: 51.2 mL/min (A) (by C-G formula based on SCr of 1.3 mg/dL (H)).  Liver Function Tests: Recent Labs  Lab 10/03/19 1309  AST 40  ALT 36  ALKPHOS 78  BILITOT 0.9  PROT 7.3  ALBUMIN 3.9   Radiological Exams on Admission: CT HEAD WO CONTRAST  Addendum Date: 10/03/2019   ADDENDUM REPORT: 10/03/2019 18:02 ADDENDUM: Clinical team at the [facility] emergency Department called to discuss this case. There is acute hemorrhage layering dependently in the occipital horn of the left lateral ventricle. I do not see any evidence of intraparenchymal hemorrhage or subarachnoid blood elsewhere. Electronically Signed   By: Nelson Chimes M.D.   On: 10/03/2019 18:02   Result Date: 10/03/2019 CLINICAL DATA:  Status post fall. EXAM: CT HEAD WITHOUT CONTRAST CT CERVICAL SPINE WITHOUT CONTRAST TECHNIQUE: Multidetector CT imaging of the head and cervical spine was performed following the standard protocol without intravenous contrast. Multiplanar CT image reconstructions of the cervical spine were also generated. COMPARISON:  None. FINDINGS: CT HEAD FINDINGS Brain: No evidence of acute infarction, hemorrhage, hydrocephalus, extra-axial collection or mass lesion/mass effect. There is chronic diffuse atrophy. Chronic bilateral periventricular white matter small vessel ischemic changes are noted. Vascular: No hyperdense vessel is noted. Skull: Normal. Negative for fracture or focal lesion. Sinuses/Orbits: No acute finding. Other: None. CT CERVICAL SPINE FINDINGS Alignment: Normal. Skull base and vertebrae: No acute fracture. Patient status post anterior fusion of C3 through C7 without malalignment. Soft tissues and spinal canal: No prevertebral fluid or swelling. No visible canal hematoma. Disc levels:  Status post prior fusion of C3 through C7. Upper chest: Emphysematous changes of lung apices are noted. Other: None.  IMPRESSION: 1. No focal acute intracranial abnormality identified. 2. Chronic diffuse atrophy and chronic bilateral periventricular white matter small vessel ischemic change. 3. No acute fracture or dislocation of cervical spine. 4. Status post prior anterior fusion of C3 through C7 without malalignment. Electronically Signed: By: Abelardo Diesel M.D. On: 10/03/2019 14:56   CT CHEST W CONTRAST  Result Date: 10/03/2019 CLINICAL DATA:  Abdominal trauma. EXAM: CT CHEST, ABDOMEN, AND PELVIS WITH CONTRAST TECHNIQUE: Multidetector CT imaging of the chest, abdomen and pelvis was performed following the standard protocol during bolus administration of intravenous contrast. CONTRAST:  148mL OMNIPAQUE IOHEXOL 300 MG/ML  SOLN COMPARISON:  December 23, 2008. FINDINGS: CT CHEST FINDINGS Cardiovascular: Atherosclerosis of thoracic aorta is noted without aneurysm or dissection. Normal cardiac size. No pericardial effusion. Coronary artery calcifications are noted. Mediastinum/Nodes: No enlarged  mediastinal, hilar, or axillary lymph nodes. Thyroid gland, trachea, and esophagus demonstrate no significant findings. Lungs/Pleura: No pneumothorax or pleural effusion is noted. Emphysematous disease is noted in both lungs. Calcified granuloma is noted in right lung base which is unchanged compared to prior exam. 7 mm subpleural nodule is noted posteriorly in the left lower lobe best seen on image number 106 of series 4. Musculoskeletal: No chest wall mass or suspicious bone lesions identified. CT ABDOMEN PELVIS FINDINGS Hepatobiliary: No focal liver abnormality is seen. No gallstones, gallbladder wall thickening, or biliary dilatation. Pancreas: Unremarkable. No pancreatic ductal dilatation or surrounding inflammatory changes. Spleen: Calcified splenic granulomata are noted. No acute abnormality is seen. Adrenals/Urinary Tract: Adrenal glands are unremarkable. Kidneys are normal, without renal calculi, focal lesion, or hydronephrosis.  Bladder is unremarkable. Stomach/Bowel: Stomach is within normal limits. Appendix appears normal. No evidence of bowel wall thickening, distention, or inflammatory changes. Vascular/Lymphatic: Aortic atherosclerosis. No enlarged abdominal or pelvic lymph nodes. Reproductive: Prostate is unremarkable. Other: No abdominal wall hernia or abnormality. No abdominopelvic ascites. Musculoskeletal: No acute or significant osseous findings. IMPRESSION: 1. Coronary artery calcifications are noted suggesting coronary artery disease. 2. 7 mm subpleural nodule is noted posteriorly in the left lower lobe. Non-contrast chest CT at 6-12 months is recommended. If the nodule is stable at time of repeat CT, then future CT at 18-24 months (from today's scan) is considered optional for low-risk patients, but is recommended for high-risk patients. This recommendation follows the consensus statement: Guidelines for Management of Incidental Pulmonary Nodules Detected on CT Images: From the Fleischner Society 2017; Radiology 2017; 284:228-243. 3. No other abnormality seen in the chest, abdomen or pelvis. Aortic Atherosclerosis (ICD10-I70.0) and Emphysema (ICD10-J43.9). Electronically Signed   By: Marijo Conception M.D.   On: 10/03/2019 15:09   CT CERVICAL SPINE WO CONTRAST  Addendum Date: 10/03/2019   ADDENDUM REPORT: 10/03/2019 18:02 ADDENDUM: Clinical team at the [facility] emergency Department called to discuss this case. There is acute hemorrhage layering dependently in the occipital horn of the left lateral ventricle. I do not see any evidence of intraparenchymal hemorrhage or subarachnoid blood elsewhere. Electronically Signed   By: Nelson Chimes M.D.   On: 10/03/2019 18:02   Result Date: 10/03/2019 CLINICAL DATA:  Status post fall. EXAM: CT HEAD WITHOUT CONTRAST CT CERVICAL SPINE WITHOUT CONTRAST TECHNIQUE: Multidetector CT imaging of the head and cervical spine was performed following the standard protocol without intravenous  contrast. Multiplanar CT image reconstructions of the cervical spine were also generated. COMPARISON:  None. FINDINGS: CT HEAD FINDINGS Brain: No evidence of acute infarction, hemorrhage, hydrocephalus, extra-axial collection or mass lesion/mass effect. There is chronic diffuse atrophy. Chronic bilateral periventricular white matter small vessel ischemic changes are noted. Vascular: No hyperdense vessel is noted. Skull: Normal. Negative for fracture or focal lesion. Sinuses/Orbits: No acute finding. Other: None. CT CERVICAL SPINE FINDINGS Alignment: Normal. Skull base and vertebrae: No acute fracture. Patient status post anterior fusion of C3 through C7 without malalignment. Soft tissues and spinal canal: No prevertebral fluid or swelling. No visible canal hematoma. Disc levels:  Status post prior fusion of C3 through C7. Upper chest: Emphysematous changes of lung apices are noted. Other: None. IMPRESSION: 1. No focal acute intracranial abnormality identified. 2. Chronic diffuse atrophy and chronic bilateral periventricular white matter small vessel ischemic change. 3. No acute fracture or dislocation of cervical spine. 4. Status post prior anterior fusion of C3 through C7 without malalignment. Electronically Signed: By: Abelardo Diesel M.D. On: 10/03/2019  14:56   CT ABDOMEN PELVIS W CONTRAST  Result Date: 10/03/2019 CLINICAL DATA:  Abdominal trauma. EXAM: CT CHEST, ABDOMEN, AND PELVIS WITH CONTRAST TECHNIQUE: Multidetector CT imaging of the chest, abdomen and pelvis was performed following the standard protocol during bolus administration of intravenous contrast. CONTRAST:  177mL OMNIPAQUE IOHEXOL 300 MG/ML  SOLN COMPARISON:  December 23, 2008. FINDINGS: CT CHEST FINDINGS Cardiovascular: Atherosclerosis of thoracic aorta is noted without aneurysm or dissection. Normal cardiac size. No pericardial effusion. Coronary artery calcifications are noted. Mediastinum/Nodes: No enlarged mediastinal, hilar, or axillary  lymph nodes. Thyroid gland, trachea, and esophagus demonstrate no significant findings. Lungs/Pleura: No pneumothorax or pleural effusion is noted. Emphysematous disease is noted in both lungs. Calcified granuloma is noted in right lung base which is unchanged compared to prior exam. 7 mm subpleural nodule is noted posteriorly in the left lower lobe best seen on image number 106 of series 4. Musculoskeletal: No chest wall mass or suspicious bone lesions identified. CT ABDOMEN PELVIS FINDINGS Hepatobiliary: No focal liver abnormality is seen. No gallstones, gallbladder wall thickening, or biliary dilatation. Pancreas: Unremarkable. No pancreatic ductal dilatation or surrounding inflammatory changes. Spleen: Calcified splenic granulomata are noted. No acute abnormality is seen. Adrenals/Urinary Tract: Adrenal glands are unremarkable. Kidneys are normal, without renal calculi, focal lesion, or hydronephrosis. Bladder is unremarkable. Stomach/Bowel: Stomach is within normal limits. Appendix appears normal. No evidence of bowel wall thickening, distention, or inflammatory changes. Vascular/Lymphatic: Aortic atherosclerosis. No enlarged abdominal or pelvic lymph nodes. Reproductive: Prostate is unremarkable. Other: No abdominal wall hernia or abnormality. No abdominopelvic ascites. Musculoskeletal: No acute or significant osseous findings. IMPRESSION: 1. Coronary artery calcifications are noted suggesting coronary artery disease. 2. 7 mm subpleural nodule is noted posteriorly in the left lower lobe. Non-contrast chest CT at 6-12 months is recommended. If the nodule is stable at time of repeat CT, then future CT at 18-24 months (from today's scan) is considered optional for low-risk patients, but is recommended for high-risk patients. This recommendation follows the consensus statement: Guidelines for Management of Incidental Pulmonary Nodules Detected on CT Images: From the Fleischner Society 2017; Radiology 2017;  284:228-243. 3. No other abnormality seen in the chest, abdomen or pelvis. Aortic Atherosclerosis (ICD10-I70.0) and Emphysema (ICD10-J43.9). Electronically Signed   By: Marijo Conception M.D.   On: 10/03/2019 15:09   DG Pelvis Portable  Result Date: 10/03/2019 CLINICAL DATA:  Status post fall this morning. EXAM: PORTABLE PELVIS 1-2 VIEWS COMPARISON:  None. FINDINGS: There is no evidence of pelvic fracture or diastasis. Mild degenerative joint changes of bilateral hips with narrowed joint space and osteophyte formation are noted. IMPRESSION: No acute fracture or dislocation. Electronically Signed   By: Abelardo Diesel M.D.   On: 10/03/2019 13:15   DG Chest Port 1 View  Result Date: 10/03/2019 CLINICAL DATA:  Right chest pain EXAM: PORTABLE CHEST 1 VIEW COMPARISON:  03/18/2018 FINDINGS: The lungs are well expanded and are symmetric. There has developed bibasilar reticulonodular infiltrate, a finding can be seen with atypical infection or aspiration. No pneumothorax or pleural effusion. Cardiac size within normal limits. Pulmonary vascularity normal. No acute bone abnormality. Cervical fusion hardware again noted. IMPRESSION: Interval development of bibasilar reticulonodular infiltrate suspicious for atypical infection. Electronically Signed   By: Fidela Salisbury MD   On: 10/03/2019 13:16    EKG: Independently reviewed.  Vent. rate 102 BPM PR interval * ms QRS duration 89 ms QT/QTc 355/463 ms P-R-T axes 83 -45 83 Sinus tachycardia Ventricular premature complex Left anterior fascicular  block Anterior infarct, old  Assessment/Plan Principal Problem:   Intracranial bleed (HCC) Observation/telemetry. Frequent neuro checks. Hold clopidogrel for now. Repeat imaging only if neuro decline. Neurosurgery consult appreciated.  Active Problems:   Essential hypertension Continue irbesartan 75 mg p.o. daily. Monitor blood pressure, renal function electrolytes.    Tobacco abuse Declined nicotine  replacement therapy. Tobacco cessation information advised.    DM type 2 (diabetes mellitus, type 2) (HCC) Carbohydrate modified diet. Continue Lantus 46 units SQ daily. CBG monitoring with R ISS.    Hyperlipidemia On atorvastatin.    COPD (chronic obstructive pulmonary disease) (HCC) Smoking cessation advised. Continue Symbicort or formulary equivalent. Supplemental oxygen as needed. Bronchodilators as needed.    Leukocytosis Likely stress-induced. Follow WBC.    Lung nodule Repeat CT in 6 months as an outpatient.    DVT prophylaxis: SCDs. Code Status:   Full code. Family Communication:   Disposition Plan:   Patient is from:  Home.  Anticipated DC to:  Home.  Anticipated DC date:  10/04/2019.  Anticipated DC barriers: Clinical status.  Consults called:  Dr. Erline Levine (neurosurgery). Admission status:  Observation/telemetry.  Severity of Illness:  Reubin Milan MD [facility]  How to contact the [facility] Attending or Consulting provider [place] or covering provider during after hours [place], for this patient?   1. Check the care team in [facility] and look for a) attending/consulting [facility] provider listed and b) the [facility] team listed 2. Log into [email or web address] and use [place]'s universal password to access. If you do not have the password, please contact the hospital operator. 3. Locate the [facility] provider you are looking for under [facility] and page to a number that you can be directly reached. 4. If you still have difficulty reaching the provider, please page the [facility] (Director on Call) for the Hospitalists listed on amion for assistance.  10/03/2019, 11:53 PM   This document was prepared using Dragon voice recognition software and may contain some unintended transcription errors.

## 2019-10-03 NOTE — ED Notes (Signed)
Patient provided with an incentive spirometer and given instructions.

## 2019-10-03 NOTE — ED Provider Notes (Addendum)
  Provider Note MRN:  [id number]  Arrival date & time: 10/03/19    ED Course and Medical Decision Making  Assumed care from PA [place] at shift change.  I discussed patient's case and CT imaging with Dr. Vertell Limber of neurosurgery as well as Dr. Julaine Fusi PA.  Plan is for overnight observation with the hospitalist service, no need for intervention at this time.  I specifically asked if patient needs to be admitted to the [facility], and Dr. Vertell Limber felt that the possibility of worsening was quite low and that patient is appropriate for admission here at [facility].  CT imaging only necessary if patient clinically worsens per Dr. Vertell Limber.  Admitted to hospitalist service for further care.  Dr. Vertell Limber of neurology agrees to document these recommendations.    .Critical Care Performed by: Maudie Flakes, MD Authorized by: Maudie Flakes, MD   Critical care provider statement:    Critical care time (minutes):  45   Critical care was necessary to treat or prevent imminent or life-threatening deterioration of the following conditions: intracranial bleeding.   Critical care was time spent personally by me on the following activities:  Discussions with consultants, evaluation of patient's response to treatment, examination of patient, ordering and performing treatments and interventions, ordering and review of laboratory studies, ordering and review of radiographic studies, pulse oximetry, re-evaluation of patient's condition, obtaining history from patient or surrogate and review of old charts    Final Clinical Impressions(s) / ED Diagnoses     ICD-10-CM   1. Intracranial hemorrhage ([place])  I62.9     ED Discharge Orders    None      Discharge Instructions   None     Zebulen Simonis. [place] Small, [place] [email or web address]    Maudie Flakes, MD 10/03/19 2322    Maudie Flakes, MD 10/03/19 [phone]

## 2019-10-03 NOTE — ED Notes (Signed)
Pt given water per MD approval

## 2019-10-03 NOTE — ED Provider Notes (Signed)
[facility] EMERGENCY DEPARTMENT Provider Note   CSN: [id number] Arrival date & time: 10/03/19  1213     History Chief Complaint  Patient presents with  . Fall    Terry Munoz is a 69 y.o. male history of COPD, diabetes, hypertension, PVD, on Plavix.  Patient presents today after a fall.  For an hour prior to arrival he was jumping off the back of his truck tailgate, he tripped falling onto his right side and injuring his right lower rib.  He describes right rib pain as constant moderate in intensity aching pain worsened with palpation improved with rest and with morphine given by EMS.  He denies any other injury.  Denies head injury, headache, loss of consciousness, vision change, nausea/vomiting, neck pain, shortness of breath, abdominal pain, back pain, pelvic pain, extremity pain, numbness/tingling, weakness or any additional concerns.  HPI     Past Medical History:  Diagnosis Date  . COPD (chronic obstructive pulmonary disease) ([place])   . 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 Hypertension   . Hyponatremia 03/2014  . Insomnia   . Kidney stones   . Mouth ulcers   . Osteoarthritis   . Otitis media   . Peripheral vascular disease ([place])   . Sinusitis     Patient Active Problem List   Diagnosis Date Noted  . 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 PVD (peripheral vascular disease) with claudication ([place]) 05/01/2014  . COPD exacerbation ([place]) 04/21/2014  . possible Aspiration pneumonia 04/21/2014  . DM type 2 (diabetes mellitus, type 2) ([place]) 04/21/2014  . Alcohol withdrawal delirium ([place]) 04/20/2014  . Diabetes mellitus, stable ([place]) 04/20/2014  . Essential hypertension 04/20/2014  . Hyponatremia 04/20/2014  . Tobacco abuse 04/20/2014  . Claudication of right lower extremity ([place]) 04/18/2014  . Peripheral artery disease ([place]) 04/18/2014    Past Surgical History:  Procedure Laterality Date  . DENTAL SURGERY    .  FEMORAL-POPLITEAL BYPASS GRAFT Right 07/24/2015   Procedure: Thrombectomy Right Femoral-Popliteal Artery; Thrombectomy Right Anterior-Tibial Artery, Thrombectomy Right Tibial-Peroneal Trunk; Bovine Patch Angioplasty Right Popliteal Artery; Endarterectomy Right Anterior Tibial Artery and Right Tibial-Peroneal Trunk; Right Leg Angiogram, Four Compartment Fasciotomy Right Lower Leg;  Surgeon: Conrad Tallmadge, MD;  Location: [place];  Service: Vascular;  L  . HERNIA REPAIR    . lithotomy    . LOWER EXTREMITY ANGIOGRAM N/A 05/01/2014   Procedure: LOWER EXTREMITY ANGIOGRAM;  Surgeon: Laverda Page, MD;  Location: [facility] CATH LAB;  Service: Cardiovascular;  Laterality: N/A;  . NECK SURGERY    . POPLITEAL ARTERY ANGIOPLASTY Right 05/02/2014   to mid sfa  . THROMBECTOMY  05/02/2014       Family History  Problem Relation Age of Onset  . Heart disease Mother   . Hypertension Mother   . Hypertension Father   . Hypertension Sister   . Hypertension Brother   . Diabetes Brother   . Hypertension Sister     Social History   Tobacco Use  . Smoking status: Current Every Day Smoker    Packs/day: 2.00    Years: 45.00    Pack years: 90.00    Types: Cigarettes  . Smokeless tobacco: Never Used  Vaping Use  . Vaping Use: Some days  Substance Use Topics  . Alcohol use: Yes    Alcohol/week: 24.0 standard drinks    Types: 24 Cans of beer per week    Comment: daily  . Drug use:  No    Home Medications Prior to Admission medications   Medication Sig Start Date End Date Taking? Authorizing Provider  albuterol (PROVENTIL HFA;VENTOLIN HFA) 108 (90 Base) MCG/ACT inhaler Inhale 1-2 puffs into the lungs every 6 (six) hours as needed for wheezing or shortness of breath.   Yes [provider]  amitriptyline (ELAVIL) 50 MG tablet Take 50 mg by mouth at bedtime.   Yes [provider]  atorvastatin (LIPITOR) 40 MG tablet TAKE 1 TABLET DAILY 07/01/19  Yes Adrian Prows, MD  budesonide-formoterol  [facility]) 160-4.5 MCG/ACT inhaler Inhale 2 puffs into the lungs 2 (two) times daily.   Yes [provider]  clopidogrel (PLAVIX) 75 MG tablet TAKE 1 TABLET DAILY 01/27/19  Yes Adrian Prows, MD  GLYXAMBI 10-5 MG TABS  04/09/19  Yes [provider]  insulin glargine (LANTUS) 100 UNIT/ML injection Inject 36 Units into the skin daily.   Yes [provider]  insulin lispro (HUMALOG) 100 UNIT/ML injection Inject 3 Units into the skin 3 (three) times daily after meals.   Yes [provider]  irbesartan (AVAPRO) 75 MG tablet Take 1 tablet (75 mg total) by mouth daily. 04/24/14  Yes Geradine Girt, DO  omeprazole (PRILOSEC) 20 MG capsule Take 20 mg by mouth daily as needed (heartburn).   Yes [provider]  thiamine (VITAMIN B-1) 50 MG tablet Take 150 mg by mouth daily.   Yes [provider]  albuterol (PROVENTIL) (2.5 MG/3ML) 0.083% nebulizer solution Take 2.5 mg by nebulization every 6 (six) hours as needed for wheezing or shortness of breath. Patient not taking: Reported on 10/03/2019    [provider]    Allergies    Sulfa antibiotics  Review of Systems   Review of Systems Ten systems are reviewed and are negative for acute change except as noted in the HPI  Physical Exam Updated Vital Signs BP (!) 172/125   Pulse (!) 103   Temp 98 F (36.7 C) (Oral)   Resp 13   Ht 5\' 8"  (1.727 m)   Wt 73 kg   SpO2 91%   BMI 24.48 kg/m   Physical Exam Constitutional:      General: He is not in acute distress.    Appearance: Normal appearance. He is well-developed. He is not ill-appearing or diaphoretic.  HENT:     Head: Normocephalic and atraumatic. No raccoon eyes or Battle's sign.     Jaw: There is normal jaw occlusion.     Right Ear: External ear normal. No hemotympanum.     Left Ear: External ear normal. No hemotympanum.     Nose: Nose normal.     Right Nostril: No epistaxis.     Left Nostril: No epistaxis.     Mouth/Throat:      Mouth: Mucous membranes are moist.     Pharynx: Oropharynx is clear.     Comments: No evidence of dental injury.  Eyes:     General: Vision grossly intact. Gaze aligned appropriately.     Extraocular Movements: Extraocular movements intact.     Pupils: Pupils are equal, round, and reactive to light.     Comments: No pain with extraocular motion  Neck:     Trachea: Trachea and phonation normal.  Cardiovascular:     Rate and Rhythm: Normal rate and regular rhythm.  Pulmonary:     Effort: Pulmonary effort is normal. No accessory muscle usage or respiratory distress.     Breath sounds: Normal breath sounds and  air entry.  Chest:     Chest wall: Tenderness present. No deformity or crepitus.       Comments: Tenderness to palpation of the right lower ribs without overlying skin changes. Abdominal:     General: There is no distension.     Palpations: Abdomen is soft.     Tenderness: There is no abdominal tenderness. There is no guarding or rebound.  Genitourinary:    Comments: Declined by patient. Musculoskeletal:        General: Normal range of motion.     Cervical back: Normal range of motion and neck supple. No spinous process tenderness or muscular tenderness.     Comments: No midline C/T/L spinal tenderness to palpation, no paraspinal muscle tenderness, no deformity, crepitus, or step-off noted. - Full range of motion and appropriate strength with all major joints of the bilateral upper and lower extremities for age, no pain with range of motion.Pelvis stable to compression bilaterally without pain.  Patient able to bring bilateral knees to chest without pain.  Normal gait.  Skin:    General: Skin is warm and dry.     Comments: Small abrasion of the right hand without active bleeding or indication for repair.  Neurological:     Mental Status: He is alert.     GCS: GCS eye subscore is 4. GCS verbal subscore is 5. GCS motor subscore is 6.     Comments: Speech is clear and goal  oriented, follows commands Major Cranial nerves without deficit, no facial droop Normal strength in upper and lower extremities bilaterally including dorsiflexion and plantar flexion, strong and equal grip strength Sensation normal to light and sharp touch Moves extremities without ataxia, coordination intact Normal gait  Psychiatric:        Behavior: Behavior normal.    ED Results / Procedures / Treatments   Labs (all labs ordered are listed, but only abnormal results are displayed) Labs Reviewed  COMPREHENSIVE METABOLIC PANEL - Abnormal; Notable for the following components:      Result Value   GFR calc non Af Amer 59 (*)    All other components within normal limits  CBC - Abnormal; Notable for the following components:   WBC 12.5 (*)    MCV 100.6 (*)    MCH 34.2 (*)    All other components within normal limits  I-STAT CHEM 8, ED - Abnormal; Notable for the following components:   Chloride 97 (*)    Creatinine, Ser 1.30 (*)    All other components within normal limits  LACTIC ACID, PLASMA  PROTIME-INR  APTT  SAMPLE TO BLOOD BANK    EKG EKG Interpretation  Date/Time:  Friday October 03 2019 12:51:28 EDT Ventricular Rate:  102 PR Interval:    QRS Duration: 89 QT Interval:  355 QTC Calculation: 463 R Axis:   -45 Text Interpretation: Sinus tachycardia Ventricular premature complex Left anterior fascicular block Anterior infarct, old Confirmed by Virgel Manifold [phone]) on 10/03/2019 1:37:08 PM   Radiology CT HEAD WO CONTRAST  Result Date: 10/03/2019 CLINICAL DATA:  Status post fall. EXAM: CT HEAD WITHOUT CONTRAST CT CERVICAL SPINE WITHOUT CONTRAST TECHNIQUE: Multidetector CT imaging of the head and cervical spine was performed following the standard protocol without intravenous contrast. Multiplanar CT image reconstructions of the cervical spine were also generated. COMPARISON:  None. FINDINGS: CT HEAD FINDINGS Brain: No evidence of acute infarction, hemorrhage, hydrocephalus,  extra-axial collection or mass lesion/mass effect. There is chronic diffuse atrophy. Chronic bilateral periventricular white matter  small vessel ischemic changes are noted. Vascular: No hyperdense vessel is noted. Skull: Normal. Negative for fracture or focal lesion. Sinuses/Orbits: No acute finding. Other: None. CT CERVICAL SPINE FINDINGS Alignment: Normal. Skull base and vertebrae: No acute fracture. Patient status post anterior fusion of C3 through C7 without malalignment. Soft tissues and spinal canal: No prevertebral fluid or swelling. No visible canal hematoma. Disc levels:  Status post prior fusion of C3 through C7. Upper chest: Emphysematous changes of lung apices are noted. Other: None. IMPRESSION: 1. No focal acute intracranial abnormality identified. 2. Chronic diffuse atrophy and chronic bilateral periventricular white matter small vessel ischemic change. 3. No acute fracture or dislocation of cervical spine. 4. Status post prior anterior fusion of C3 through C7 without malalignment. Electronically Signed   By: Abelardo Diesel M.D.   On: 10/03/2019 14:56   CT CHEST W CONTRAST  Result Date: 10/03/2019 CLINICAL DATA:  Abdominal trauma. EXAM: CT CHEST, ABDOMEN, AND PELVIS WITH CONTRAST TECHNIQUE: Multidetector CT imaging of the chest, abdomen and pelvis was performed following the standard protocol during bolus administration of intravenous contrast. CONTRAST:  168mL OMNIPAQUE IOHEXOL 300 MG/ML  SOLN COMPARISON:  December 23, 2008. FINDINGS: CT CHEST FINDINGS Cardiovascular: Atherosclerosis of thoracic aorta is noted without aneurysm or dissection. Normal cardiac size. No pericardial effusion. Coronary artery calcifications are noted. Mediastinum/Nodes: No enlarged mediastinal, hilar, or axillary lymph nodes. Thyroid gland, trachea, and esophagus demonstrate no significant findings. Lungs/Pleura: No pneumothorax or pleural effusion is noted. Emphysematous disease is noted in both lungs. Calcified  granuloma is noted in right lung base which is unchanged compared to prior exam. 7 mm subpleural nodule is noted posteriorly in the left lower lobe best seen on image number 106 of series 4. Musculoskeletal: No chest wall mass or suspicious bone lesions identified. CT ABDOMEN PELVIS FINDINGS Hepatobiliary: No focal liver abnormality is seen. No gallstones, gallbladder wall thickening, or biliary dilatation. Pancreas: Unremarkable. No pancreatic ductal dilatation or surrounding inflammatory changes. Spleen: Calcified splenic granulomata are noted. No acute abnormality is seen. Adrenals/Urinary Tract: Adrenal glands are unremarkable. Kidneys are normal, without renal calculi, focal lesion, or hydronephrosis. Bladder is unremarkable. Stomach/Bowel: Stomach is within normal limits. Appendix appears normal. No evidence of bowel wall thickening, distention, or inflammatory changes. Vascular/Lymphatic: Aortic atherosclerosis. No enlarged abdominal or pelvic lymph nodes. Reproductive: Prostate is unremarkable. Other: No abdominal wall hernia or abnormality. No abdominopelvic ascites. Musculoskeletal: No acute or significant osseous findings. IMPRESSION: 1. Coronary artery calcifications are noted suggesting coronary artery disease. 2. 7 mm subpleural nodule is noted posteriorly in the left lower lobe. Non-contrast chest CT at 6-12 months is recommended. If the nodule is stable at time of repeat CT, then future CT at 18-24 months (from today's scan) is considered optional for low-risk patients, but is recommended for high-risk patients. This recommendation follows the consensus statement: Guidelines for Management of Incidental Pulmonary Nodules Detected on CT Images: From the Fleischner Society 2017; Radiology 2017; 284:228-243. 3. No other abnormality seen in the chest, abdomen or pelvis. Aortic Atherosclerosis (ICD10-I70.0) and Emphysema (ICD10-J43.9). Electronically Signed   By: Marijo Conception M.D.   On: 10/03/2019  15:09   CT CERVICAL SPINE WO CONTRAST  Result Date: 10/03/2019 CLINICAL DATA:  Status post fall. EXAM: CT HEAD WITHOUT CONTRAST CT CERVICAL SPINE WITHOUT CONTRAST TECHNIQUE: Multidetector CT imaging of the head and cervical spine was performed following the standard protocol without intravenous contrast. Multiplanar CT image reconstructions of the cervical spine were also generated. COMPARISON:  None. FINDINGS: CT  HEAD FINDINGS Brain: No evidence of acute infarction, hemorrhage, hydrocephalus, extra-axial collection or mass lesion/mass effect. There is chronic diffuse atrophy. Chronic bilateral periventricular white matter small vessel ischemic changes are noted. Vascular: No hyperdense vessel is noted. Skull: Normal. Negative for fracture or focal lesion. Sinuses/Orbits: No acute finding. Other: None. CT CERVICAL SPINE FINDINGS Alignment: Normal. Skull base and vertebrae: No acute fracture. Patient status post anterior fusion of C3 through C7 without malalignment. Soft tissues and spinal canal: No prevertebral fluid or swelling. No visible canal hematoma. Disc levels:  Status post prior fusion of C3 through C7. Upper chest: Emphysematous changes of lung apices are noted. Other: None. IMPRESSION: 1. No focal acute intracranial abnormality identified. 2. Chronic diffuse atrophy and chronic bilateral periventricular white matter small vessel ischemic change. 3. No acute fracture or dislocation of cervical spine. 4. Status post prior anterior fusion of C3 through C7 without malalignment. Electronically Signed   By: Abelardo Diesel M.D.   On: 10/03/2019 14:56   CT ABDOMEN PELVIS W CONTRAST  Result Date: 10/03/2019 CLINICAL DATA:  Abdominal trauma. EXAM: CT CHEST, ABDOMEN, AND PELVIS WITH CONTRAST TECHNIQUE: Multidetector CT imaging of the chest, abdomen and pelvis was performed following the standard protocol during bolus administration of intravenous contrast. CONTRAST:  114mL OMNIPAQUE IOHEXOL 300 MG/ML  SOLN  COMPARISON:  December 23, 2008. FINDINGS: CT CHEST FINDINGS Cardiovascular: Atherosclerosis of thoracic aorta is noted without aneurysm or dissection. Normal cardiac size. No pericardial effusion. Coronary artery calcifications are noted. Mediastinum/Nodes: No enlarged mediastinal, hilar, or axillary lymph nodes. Thyroid gland, trachea, and esophagus demonstrate no significant findings. Lungs/Pleura: No pneumothorax or pleural effusion is noted. Emphysematous disease is noted in both lungs. Calcified granuloma is noted in right lung base which is unchanged compared to prior exam. 7 mm subpleural nodule is noted posteriorly in the left lower lobe best seen on image number 106 of series 4. Musculoskeletal: No chest wall mass or suspicious bone lesions identified. CT ABDOMEN PELVIS FINDINGS Hepatobiliary: No focal liver abnormality is seen. No gallstones, gallbladder wall thickening, or biliary dilatation. Pancreas: Unremarkable. No pancreatic ductal dilatation or surrounding inflammatory changes. Spleen: Calcified splenic granulomata are noted. No acute abnormality is seen. Adrenals/Urinary Tract: Adrenal glands are unremarkable. Kidneys are normal, without renal calculi, focal lesion, or hydronephrosis. Bladder is unremarkable. Stomach/Bowel: Stomach is within normal limits. Appendix appears normal. No evidence of bowel wall thickening, distention, or inflammatory changes. Vascular/Lymphatic: Aortic atherosclerosis. No enlarged abdominal or pelvic lymph nodes. Reproductive: Prostate is unremarkable. Other: No abdominal wall hernia or abnormality. No abdominopelvic ascites. Musculoskeletal: No acute or significant osseous findings. IMPRESSION: 1. Coronary artery calcifications are noted suggesting coronary artery disease. 2. 7 mm subpleural nodule is noted posteriorly in the left lower lobe. Non-contrast chest CT at 6-12 months is recommended. If the nodule is stable at time of repeat CT, then future CT at 18-24  months (from today's scan) is considered optional for low-risk patients, but is recommended for high-risk patients. This recommendation follows the consensus statement: Guidelines for Management of Incidental Pulmonary Nodules Detected on CT Images: From the Fleischner Society 2017; Radiology 2017; 284:228-243. 3. No other abnormality seen in the chest, abdomen or pelvis. Aortic Atherosclerosis (ICD10-I70.0) and Emphysema (ICD10-J43.9). Electronically Signed   By: Marijo Conception M.D.   On: 10/03/2019 15:09   DG Pelvis Portable  Result Date: 10/03/2019 CLINICAL DATA:  Status post fall this morning. EXAM: PORTABLE PELVIS 1-2 VIEWS COMPARISON:  None. FINDINGS: There is no evidence of pelvic fracture or diastasis.  Mild degenerative joint changes of bilateral hips with narrowed joint space and osteophyte formation are noted. IMPRESSION: No acute fracture or dislocation. Electronically Signed   By: Abelardo Diesel M.D.   On: 10/03/2019 13:15   DG Chest Port 1 View  Result Date: 10/03/2019 CLINICAL DATA:  Right chest pain EXAM: PORTABLE CHEST 1 VIEW COMPARISON:  03/18/2018 FINDINGS: The lungs are well expanded and are symmetric. There has developed bibasilar reticulonodular infiltrate, a finding can be seen with atypical infection or aspiration. No pneumothorax or pleural effusion. Cardiac size within normal limits. Pulmonary vascularity normal. No acute bone abnormality. Cervical fusion hardware again noted. IMPRESSION: Interval development of bibasilar reticulonodular infiltrate suspicious for atypical infection. Electronically Signed   By: Fidela Salisbury MD   On: 10/03/2019 13:16    Procedures Procedures (including critical care time)  Medications Ordered in ED Medications  fentaNYL (SUBLIMAZE) injection 50 mcg (50 mcg Intravenous Given 10/03/19 1314)  sodium chloride 0.9 % bolus 500 mL (0 mLs Intravenous Stopped 10/03/19 1343)  iohexol (OMNIPAQUE) 300 MG/ML solution 100 mL (100 mLs Intravenous Contrast Given  10/03/19 1419)  fentaNYL (SUBLIMAZE) injection 50 mcg (50 mcg Intravenous Given 10/03/19 1528)    ED Course  I have reviewed the triage vital signs and the nursing notes.  Pertinent labs & imaging results that were available during my care of the patient were reviewed by me and considered in my medical decision making (see chart for details).  Clinical Course as of Oct 03 1855  Fri Oct 03, 2019  1757 Dr. Maree Erie   [BM]  [place] updated.   [BM]  1853 Sarita Bottom en route   [BM]    Clinical Course User Index [BM] Gari Crown   MDM Rules/Calculators/A&P                         Additional History Obtained: 1. Nursing notes from this visit. 2. Family at bedside. ------------------------------------ 69 year old male on Plavix arrives after falling off the bed of his truck, he tripped when jumping down.  This appears to be mechanical in nature, no head injury or loss of consciousness.  He has right lower rib pain without overlying skin changes.  His only other injury is a small abrasion on his right hand.  He reports his Tdap is up-to-date.  Given his age and anticoagulation will obtain lab work and trauma scans. -------------------- I ordered, reviewed and interpreted labs which include: CBC shows leukocytosis of 12.5, suspect this to be secondary to his injury today, he has no history suggestive of infection.  Hemoglobin within normal limits. Lactic 1.9 CMP shows no emergent electrolyte derangement, evidence of acute kidney injury, elevation of LFTs or gap. PT/INR and APTT within normal limits. I-STAT Chem-8 nonacute.  EKG: Sinus tachycardia Ventricular premature complex Left anterior fascicular block Anterior infarct, old Confirmed by Virgel Manifold [phone]) on 10/03/2019 1:37:08 PM  CXR:  IMPRESSION:  Interval development of bibasilar reticulonodular infiltrate  suspicious for atypical infection.  I personally viewed patient's chest x-ray.  Possible that the  nodular infiltrate seen on x-ray today is secondary to pulmonary nodules seen better on CT of the chest.  Patient has no infectious symptoms doubt pneumonia at this time.  DG Pelvis:  IMPRESSION:  No acute fracture or dislocation.  I have personally reviewed patient's pelvis x-ray and agree with radiologist interpretation above.  CT Chest AP: CT Chest/AP:  IMPRESSION:  1. Coronary artery calcifications are noted suggesting  coronary  artery disease.  2. 7 mm subpleural nodule is noted posteriorly in the left lower  lobe. Non-contrast chest CT at 6-12 months is recommended. If the  nodule is stable at time of repeat CT, then future CT at 18-24  months (from today's scan) is considered optional for low-risk  patients, but is recommended for high-risk patients. This  recommendation follows the consensus statement: Guidelines for  Management of Incidental Pulmonary Nodules Detected on CT Images:  From the Fleischner Society 2017; Radiology 2017; 284:228-243.  3. No other abnormality seen in the chest, abdomen or pelvis.    Aortic Atherosclerosis (ICD10-I70.0) and Emphysema (ICD10-J43.9).  - CT head/cervical spine:  IMPRESSION:  1. No focal acute intracranial abnormality identified.  2. Chronic diffuse atrophy and chronic bilateral periventricular  white matter small vessel ischemic change.  3. No acute fracture or dislocation of cervical spine.  4. Status post prior anterior fusion of C3 through C7 without  malalignment.  I have personally reviewed patient's CT cervical spine and agree with radiologist interpretation, no obvious dislocation or fracture.  Patient had been reexamined and he was resting comfortably in bed no acute distress normal neuro exam walking around the room fully dressed requesting discharge.  He appeared safe for discharge he was sent home with his wife and was prescribed Norco for pain secondary to suspected right rib contusion.  We discussed narcotic precautions  and he stated understanding.  His wife drove home.  I urged to follow-up with PCP.  Patient and his wife were informed of all incidental findings including pulmonary nodules and he stated understanding.  He was given an incentive spirometer to help avoid development of pneumonia due to rib pain.  Upon completion of this note around 5:45 PM I reviewed the CT head a second time and saw a small amount of what appeared to be blood in the occipital lobe.  I then called [facility] radiology and the CT scan was reviewed with Dr. Maree Erie who advised this was an acute hemorrhage.  I then called the patient's wife Terry Munoz at 6:02 PM and updated her of the results.  She is aware and is bring patient back to the emergency department.  CT Head: ADDENDUM: Clinical team at the [facility] emergency Department called to discuss this case. There is acute hemorrhage layering dependently in the occipital horn of the left lateral ventricle. I do not see any evidence of intraparenchymal hemorrhage or subarachnoid blood elsewhere. ----------------------------- Spoke with patient's wife Terry Munoz over the phone again at 6:55 PM, they are in route.  Patient is feeling well, no current complaints.  They state understanding to come straight to [facility] emergency department.  I have updated charge nurse Eboni who is aware of patient. - 7:26 PM: Patient arrives alert NAD.     Note: Portions of this report may have been transcribed using voice recognition software. Every effort was made to ensure accuracy; however, inadvertent computerized transcription errors may still be present. Final Clinical Impression(s) / ED Diagnoses Final diagnoses:  Fall  Rib pain on right side  Elevated blood pressure reading    Rx / DC Orders ED Discharge Orders         Ordered    HYDROcodone-acetaminophen (NORCO/VICODIN) 5-325 MG tablet  Every 6 hours PRN     Discontinue  Reprint     10/03/19 1646           Gari Crown 10/03/19 1929    Virgel Manifold, MD  10/04/19 0656  
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
## 2019-10-04 ENCOUNTER — Other Ambulatory Visit: Payer: Self-pay

## 2019-10-04 ENCOUNTER — Encounter (HOSPITAL_COMMUNITY): Payer: Self-pay | Admitting: Internal Medicine

## 2019-10-04 DIAGNOSIS — J449 Chronic obstructive pulmonary disease, unspecified: Secondary | ICD-10-CM | POA: Diagnosis present

## 2019-10-04 DIAGNOSIS — I619 Nontraumatic intracerebral hemorrhage, unspecified: Secondary | ICD-10-CM | POA: Diagnosis present

## 2019-10-04 DIAGNOSIS — R911 Solitary pulmonary nodule: Secondary | ICD-10-CM | POA: Diagnosis present

## 2019-10-04 DIAGNOSIS — E1151 Type 2 diabetes mellitus with diabetic peripheral angiopathy without gangrene: Secondary | ICD-10-CM | POA: Diagnosis present

## 2019-10-04 DIAGNOSIS — F5104 Psychophysiologic insomnia: Secondary | ICD-10-CM | POA: Diagnosis present

## 2019-10-04 DIAGNOSIS — S06309A Unspecified focal traumatic brain injury with loss of consciousness of unspecified duration, initial encounter: Secondary | ICD-10-CM | POA: Diagnosis present

## 2019-10-04 DIAGNOSIS — D72829 Elevated white blood cell count, unspecified: Secondary | ICD-10-CM | POA: Diagnosis present

## 2019-10-04 DIAGNOSIS — E162 Hypoglycemia, unspecified: Secondary | ICD-10-CM | POA: Diagnosis not present

## 2019-10-04 DIAGNOSIS — I10 Essential (primary) hypertension: Secondary | ICD-10-CM

## 2019-10-04 DIAGNOSIS — W010XXA Fall on same level from slipping, tripping and stumbling without subsequent striking against object, initial encounter: Secondary | ICD-10-CM | POA: Diagnosis present

## 2019-10-04 DIAGNOSIS — Z7902 Long term (current) use of antithrombotics/antiplatelets: Secondary | ICD-10-CM | POA: Diagnosis not present

## 2019-10-04 DIAGNOSIS — Z882 Allergy status to sulfonamides status: Secondary | ICD-10-CM | POA: Diagnosis not present

## 2019-10-04 DIAGNOSIS — Z87442 Personal history of urinary calculi: Secondary | ICD-10-CM | POA: Diagnosis not present

## 2019-10-04 DIAGNOSIS — E1159 Type 2 diabetes mellitus with other circulatory complications: Secondary | ICD-10-CM

## 2019-10-04 DIAGNOSIS — Z20822 Contact with and (suspected) exposure to covid-19: Secondary | ICD-10-CM | POA: Diagnosis present

## 2019-10-04 DIAGNOSIS — R296 Repeated falls: Secondary | ICD-10-CM | POA: Diagnosis present

## 2019-10-04 DIAGNOSIS — Z833 Family history of diabetes mellitus: Secondary | ICD-10-CM | POA: Diagnosis not present

## 2019-10-04 DIAGNOSIS — Z79899 Other long term (current) drug therapy: Secondary | ICD-10-CM | POA: Diagnosis not present

## 2019-10-04 DIAGNOSIS — Z79891 Long term (current) use of opiate analgesic: Secondary | ICD-10-CM | POA: Diagnosis not present

## 2019-10-04 DIAGNOSIS — M1990 Unspecified osteoarthritis, unspecified site: Secondary | ICD-10-CM | POA: Diagnosis present

## 2019-10-04 DIAGNOSIS — Z794 Long term (current) use of insulin: Secondary | ICD-10-CM | POA: Diagnosis not present

## 2019-10-04 DIAGNOSIS — Z7951 Long term (current) use of inhaled steroids: Secondary | ICD-10-CM | POA: Diagnosis not present

## 2019-10-04 DIAGNOSIS — E785 Hyperlipidemia, unspecified: Secondary | ICD-10-CM | POA: Diagnosis present

## 2019-10-04 DIAGNOSIS — I629 Nontraumatic intracranial hemorrhage, unspecified: Secondary | ICD-10-CM

## 2019-10-04 DIAGNOSIS — R569 Unspecified convulsions: Secondary | ICD-10-CM | POA: Diagnosis present

## 2019-10-04 DIAGNOSIS — R0781 Pleurodynia: Secondary | ICD-10-CM | POA: Diagnosis present

## 2019-10-04 DIAGNOSIS — F17210 Nicotine dependence, cigarettes, uncomplicated: Secondary | ICD-10-CM | POA: Diagnosis present

## 2019-10-04 DIAGNOSIS — Z8249 Family history of ischemic heart disease and other diseases of the circulatory system: Secondary | ICD-10-CM | POA: Diagnosis not present

## 2019-10-04 DIAGNOSIS — R0789 Other chest pain: Secondary | ICD-10-CM | POA: Diagnosis present

## 2019-10-04 HISTORY — DX: Hyperlipidemia, unspecified: E78.5

## 2019-10-04 LAB — CBC
HCT: 46.9 % (ref 39.0–52.0)
HCT: 45.3 % (ref 39.0–52.0)
Hemoglobin: 15.6 g/dL (ref 13.0–17.0)
Hemoglobin: 15.3 g/dL (ref 13.0–17.0)
MCH: 33.5 pg (ref 26.0–34.0)
MCH: 34.2 pg — ABNORMAL HIGH (ref 26.0–34.0)
MCHC: 33.3 g/dL (ref 30.0–36.0)
MCHC: 33.8 g/dL (ref 30.0–36.0)
MCV: 100.9 fL — ABNORMAL HIGH (ref 80.0–100.0)
MCV: 101.1 fL — ABNORMAL HIGH (ref 80.0–100.0)
Platelets: 169 10*3/uL (ref 150–400)
Platelets: 170 10*3/uL (ref 150–400)
RBC: 4.65 MIL/uL (ref 4.22–5.81)
RBC: 4.48 MIL/uL (ref 4.22–5.81)
RDW: 13 % (ref 11.5–15.5)
RDW: 13.2 % (ref 11.5–15.5)
WBC: 9.4 10*3/uL (ref 4.0–10.5)
WBC: 7.2 10*3/uL (ref 4.0–10.5)
nRBC: 0 % (ref 0.0–0.2)
nRBC: 0 % (ref 0.0–0.2)

## 2019-10-04 LAB — COMPREHENSIVE METABOLIC PANEL
ALT: 24 U/L (ref 0–44)
AST: 26 U/L (ref 15–41)
Albumin: 3.6 g/dL (ref 3.5–5.0)
Alkaline Phosphatase: 74 U/L (ref 38–126)
Anion gap: 9 (ref 5–15)
BUN: 15 mg/dL (ref 8–23)
CO2: 29 mmol/L (ref 22–32)
Calcium: 8.7 mg/dL — ABNORMAL LOW (ref 8.9–10.3)
Chloride: 98 mmol/L (ref 98–111)
Creatinine, Ser: 1.1 mg/dL (ref 0.61–1.24)
GFR calc Af Amer: >60 mL/min (ref >60)
GFR calc non Af Amer: >60 mL/min (ref >60)
Glucose, Bld: 187 mg/dL — ABNORMAL HIGH (ref 70–99)
Potassium: 3.7 mmol/L (ref 3.5–5.1)
Sodium: 136 mmol/L (ref 135–145)
Total Bilirubin: 0.9 mg/dL (ref 0.3–1.2)
Total Protein: 7 g/dL (ref 6.5–8.1)

## 2019-10-04 LAB — HEMOGLOBIN A1C
Hgb A1c MFr Bld: 7.3 % — ABNORMAL HIGH (ref 4.8–5.6)
Mean Plasma Glucose: 162.81 mg/dL

## 2019-10-04 LAB — BASIC METABOLIC PANEL
Anion gap: 12 (ref 5–15)
BUN: 15 mg/dL (ref 8–23)
CO2: 27 mmol/L (ref 22–32)
Calcium: 9 mg/dL (ref 8.9–10.3)
Chloride: 98 mmol/L (ref 98–111)
Creatinine, Ser: 1.17 mg/dL (ref 0.61–1.24)
GFR calc Af Amer: >60 mL/min (ref >60)
GFR calc non Af Amer: >60 mL/min (ref >60)
Glucose, Bld: 113 mg/dL — ABNORMAL HIGH (ref 70–99)
Potassium: 3.9 mmol/L (ref 3.5–5.1)
Sodium: 137 mmol/L (ref 135–145)

## 2019-10-04 LAB — GLUCOSE, CAPILLARY
Glucose-Capillary: 123 mg/dL — ABNORMAL HIGH (ref 70–99)
Glucose-Capillary: 165 mg/dL — ABNORMAL HIGH (ref 70–99)
Glucose-Capillary: 121 mg/dL — ABNORMAL HIGH (ref 70–99)
Glucose-Capillary: 64 mg/dL — ABNORMAL LOW (ref 70–99)
Glucose-Capillary: 81 mg/dL (ref 70–99)

## 2019-10-04 LAB — PHOSPHORUS: Phosphorus: 3.2 mg/dL (ref 2.5–4.6)

## 2019-10-04 LAB — MAGNESIUM: Magnesium: 1.9 mg/dL (ref 1.7–2.4)

## 2019-10-04 MED ORDER — EMPAGLIFLOZIN-LINAGLIPTIN 10-5 MG PO TABS: 1.0000 | ORAL_TABLET | Freq: Every morning | ORAL | Status: DC

## 2019-10-04 MED ORDER — LEVETIRACETAM 500 MG PO TABS
500.0000 mg | ORAL_TABLET | Freq: Two times a day (BID) | ORAL | Status: DC
  Administered 2019-10-04 – 2019-10-05 (×3): 500 mg via ORAL
  Filled 2019-10-04 (×4): qty 1

## 2019-10-04 MED ORDER — HYDROCODONE-ACETAMINOPHEN 5-325 MG PO TABS
1.0000 | ORAL_TABLET | Freq: Four times a day (QID) | ORAL | Status: DC | PRN
  Administered 2019-10-04: 1 via ORAL
  Filled 2019-10-04: qty 1

## 2019-10-04 MED ORDER — FOLIC ACID 1 MG PO TABS
1.0000 mg | ORAL_TABLET | Freq: Every day | ORAL | Status: DC
  Administered 2019-10-04 – 2019-10-05 (×2): 1 mg via ORAL
  Filled 2019-10-04 (×2): qty 1

## 2019-10-04 MED ORDER — INSULIN ASPART 100 UNIT/ML ~~LOC~~ SOLN
0.0000 [IU] | Freq: Three times a day (TID) | SUBCUTANEOUS | Status: DC
  Administered 2019-10-04: 2 [IU] via SUBCUTANEOUS
  Administered 2019-10-04: 3 [IU] via SUBCUTANEOUS
  Administered 2019-10-04: 2 [IU] via SUBCUTANEOUS
  Administered 2019-10-05: 3 [IU] via SUBCUTANEOUS

## 2019-10-04 MED ORDER — THIAMINE HCL 100 MG PO TABS
100.0000 mg | ORAL_TABLET | Freq: Every day | ORAL | Status: DC
  Administered 2019-10-04 – 2019-10-05 (×2): 100 mg via ORAL
  Filled 2019-10-04 (×2): qty 1

## 2019-10-04 MED ORDER — ACETAMINOPHEN 325 MG PO TABS
650.0000 mg | ORAL_TABLET | Freq: Four times a day (QID) | ORAL | Status: DC | PRN
  Administered 2019-10-04: 650 mg via ORAL
  Filled 2019-10-04: qty 2

## 2019-10-04 MED ORDER — LORAZEPAM 2 MG/ML IJ SOLN
0.7500 mg | Freq: Once | INTRAMUSCULAR | Status: AC
  Administered 2019-10-04: 0.75 mg via INTRAVENOUS
  Filled 2019-10-04: qty 1

## 2019-10-04 MED ORDER — NICOTINE 21 MG/24HR TD PT24
21.0000 mg | MEDICATED_PATCH | Freq: Every day | TRANSDERMAL | Status: DC
  Administered 2019-10-04 – 2019-10-05 (×2): 21 mg via TRANSDERMAL
  Filled 2019-10-04 (×2): qty 1

## 2019-10-04 MED ORDER — ADULT MULTIVITAMIN W/MINERALS CH
1.0000 | ORAL_TABLET | Freq: Every day | ORAL | Status: DC
  Administered 2019-10-04 – 2019-10-05 (×2): 1 via ORAL
  Filled 2019-10-04 (×2): qty 1

## 2019-10-04 MED ORDER — LORAZEPAM 2 MG/ML IJ SOLN: 0.0000 mg | Freq: Four times a day (QID) | INTRAMUSCULAR | Status: DC

## 2019-10-04 MED ORDER — LORAZEPAM 1 MG PO TABS: 1.0000 mg | ORAL_TABLET | ORAL | Status: DC | PRN

## 2019-10-04 MED ORDER — INSULIN GLARGINE 100 UNIT/ML ~~LOC~~ SOLN
46.0000 [IU] | Freq: Every day | SUBCUTANEOUS | Status: DC
  Administered 2019-10-04 – 2019-10-05 (×2): 46 [IU] via SUBCUTANEOUS
  Filled 2019-10-04 (×4): qty 0.46

## 2019-10-04 MED ORDER — LORAZEPAM 2 MG/ML IJ SOLN: 0.0000 mg | Freq: Two times a day (BID) | INTRAMUSCULAR | Status: DC

## 2019-10-04 MED ORDER — THIAMINE HCL 100 MG/ML IJ SOLN: 100.0000 mg | Freq: Every day | INTRAMUSCULAR | Status: DC

## 2019-10-04 MED ORDER — ATORVASTATIN CALCIUM 40 MG PO TABS
40.0000 mg | ORAL_TABLET | Freq: Every day | ORAL | Status: DC
  Administered 2019-10-04 – 2019-10-05 (×2): 40 mg via ORAL
  Filled 2019-10-04 (×2): qty 1

## 2019-10-04 MED ORDER — ONDANSETRON HCL 4 MG/2ML IJ SOLN: 4.0000 mg | Freq: Four times a day (QID) | INTRAMUSCULAR | Status: DC | PRN

## 2019-10-04 MED ORDER — IRBESARTAN 150 MG PO TABS
75.0000 mg | ORAL_TABLET | Freq: Every day | ORAL | Status: DC
  Administered 2019-10-04 – 2019-10-05 (×2): 75 mg via ORAL
  Filled 2019-10-04 (×5): qty 1

## 2019-10-04 MED ORDER — LINAGLIPTIN 5 MG PO TABS
5.0000 mg | ORAL_TABLET | Freq: Every day | ORAL | Status: DC
  Administered 2019-10-04 – 2019-10-05 (×2): 5 mg via ORAL
  Filled 2019-10-04 (×3): qty 1

## 2019-10-04 MED ORDER — EMPAGLIFLOZIN 10 MG PO TABS
10.0000 mg | ORAL_TABLET | Freq: Every day | ORAL | Status: DC
  Filled 2019-10-04 (×3): qty 1

## 2019-10-04 MED ORDER — EMPAGLIFLOZIN 10 MG PO TABS
10.0000 mg | ORAL_TABLET | Freq: Every day | ORAL | Status: DC
  Filled 2019-10-04 (×4): qty 1

## 2019-10-04 MED ORDER — ONDANSETRON HCL 4 MG PO TABS: 4.0000 mg | ORAL_TABLET | Freq: Four times a day (QID) | ORAL | Status: DC | PRN

## 2019-10-04 MED ORDER — ACETAMINOPHEN 650 MG RE SUPP: 650.0000 mg | Freq: Four times a day (QID) | RECTAL | Status: DC | PRN

## 2019-10-04 MED ORDER — LORAZEPAM 2 MG/ML IJ SOLN: 1.0000 mg | INTRAMUSCULAR | Status: DC | PRN

## 2019-10-04 MED ORDER — MOMETASONE FURO-FORMOTEROL FUM 200-5 MCG/ACT IN AERO
2.0000 | INHALATION_SPRAY | Freq: Two times a day (BID) | RESPIRATORY_TRACT | Status: DC
  Administered 2019-10-04: 2 via RESPIRATORY_TRACT
  Filled 2019-10-04: qty 8.8

## 2019-10-04 MED ORDER — THIAMINE HCL 100 MG PO TABS
150.0000 mg | ORAL_TABLET | Freq: Every day | ORAL | Status: DC
  Administered 2019-10-04: 150 mg via ORAL
  Filled 2019-10-04 (×2): qty 2

## 2019-10-04 MED ORDER — PANTOPRAZOLE SODIUM 40 MG PO TBEC
40.0000 mg | DELAYED_RELEASE_TABLET | Freq: Every day | ORAL | Status: DC
  Administered 2019-10-04 – 2019-10-05 (×2): 40 mg via ORAL
  Filled 2019-10-04 (×2): qty 1

## 2019-10-04 MED ORDER — ALBUTEROL SULFATE (2.5 MG/3ML) 0.083% IN NEBU: 2.5000 mg | INHALATION_SOLUTION | Freq: Four times a day (QID) | RESPIRATORY_TRACT | Status: DC | PRN

## 2019-10-04 MED ORDER — AMITRIPTYLINE HCL 25 MG PO TABS
50.0000 mg | ORAL_TABLET | Freq: Every day | ORAL | Status: DC
  Administered 2019-10-04: 50 mg via ORAL
  Filled 2019-10-04 (×2): qty 1
  Filled 2019-10-04: qty 2

## 2019-10-04 NOTE — Consult Note (Signed)
Reason for Consult: ICH Referring Physician: Dr. Sedonia Small.   HPI: Neurosurgical consult was requested by Dr. Sedonia Small for Terry Munoz who is a 69 y.o. male brought into the [facility] for concern of  intracranial hemorrhage. He was seen earlier in the day at the [facility] for a fall he sustained off the back of a pickup truck. Patient is on Plavix for peripheral vascular disease. Trauma scans were obtained and initially showed no significant injury.  On further ED review, a small intracranial hemorrhage was seen.  The ED provider contacted the patient's wife and he returned to the emergency department. Per the EDP, he is feeling well and has no current complaints. No headache, vision changes, nausea/vomiting, numbness/weakness, tingling or any additional concerns. Due to his small ICH, a neurosurgical consult was placed.    Past Medical History:  Diagnosis Date  . COPD (chronic obstructive pulmonary disease) ([place])   . 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 Hypertension   . Hyponatremia 03/2014  . Insomnia   . Kidney stones   . Mouth ulcers   . Osteoarthritis   . Otitis media   . Peripheral vascular disease ([place])   . Sinusitis     Past Surgical History:  Procedure Laterality Date  . DENTAL SURGERY    . FEMORAL-POPLITEAL BYPASS GRAFT Right 07/24/2015   Procedure: Thrombectomy Right Femoral-Popliteal Artery; Thrombectomy Right Anterior-Tibial Artery, Thrombectomy Right Tibial-Peroneal Trunk; Bovine Patch Angioplasty Right Popliteal Artery; Endarterectomy Right Anterior Tibial Artery and Right Tibial-Peroneal Trunk; Right Leg Angiogram, Four Compartment Fasciotomy Right Lower Leg;  Surgeon: Conrad [place] Troy, MD;  Location: [facility];  Service: Vascular;  L  . HERNIA REPAIR    . lithotomy    . LOWER EXTREMITY ANGIOGRAM N/A 05/01/2014   Procedure: LOWER EXTREMITY ANGIOGRAM;  Surgeon: Laverda Page, MD;  Location: [facility] CATH LAB;  Service: Cardiovascular;  Laterality: N/A;  . NECK  SURGERY    . POPLITEAL ARTERY ANGIOPLASTY Right 05/02/2014   to mid sfa  . THROMBECTOMY  05/02/2014    Family History  Problem Relation Age of Onset  . Heart disease Mother   . Hypertension Mother   . Hypertension Father   . Hypertension Sister   . Hypertension Brother   . Diabetes Brother   . Hypertension Sister     Social History:  reports that he has been smoking cigarettes. He has a 90.00 pack-year smoking history. He has never used smokeless tobacco. He reports current alcohol use of about 24.0 standard drinks of alcohol per week. He reports that he does not use drugs.  Allergies:  Allergies  Allergen Reactions  . Sulfa Antibiotics Hives    Medications: I have reviewed the patient's current medications.  Results for orders placed or performed during the hospital encounter of 10/03/19 (from the past 48 hour(s))  SARS Coronavirus 2 by RT PCR (hospital order, performed in [facility] hospital lab) Nasopharyngeal Nasopharyngeal Swab     Status: None   Collection Time: 10/03/19  8:30 PM   Specimen: Nasopharyngeal Swab  Result Value Ref Range   SARS Coronavirus 2 NEGATIVE NEGATIVE    Comment: (NOTE) SARS-CoV-2 target nucleic acids are NOT DETECTED.  The SARS-CoV-2 RNA is generally detectable in upper and lower respiratory specimens during the acute phase of infection. The lowest concentration of SARS-CoV-2 viral copies this assay can detect is 250 copies / mL. A negative result does not preclude SARS-CoV-2 infection and should not be used as the sole basis  for treatment or other patient management decisions.  A negative result may occur with improper specimen collection / handling, submission of specimen other than nasopharyngeal swab, presence of viral mutation(s) within the areas targeted by this assay, and inadequate number of viral copies (<250 copies / mL). A negative result must be combined with clinical observations, patient history, and epidemiological  information.  Fact Sheet for Patients:   StrictlyIdeas.no  Fact Sheet for Healthcare Providers: BankingDealers.co.za  This test is not yet approved or  cleared by the [place] FDA and has been authorized for detection and/or diagnosis of SARS-CoV-2 by FDA under an Emergency Use Authorization (EUA).  This EUA will remain in effect (meaning this test can be used) for the duration of the COVID-19 declaration under Section 564(b)(1) of the Act, 21 U.S.C. section 360bbb-3(b)(1), unless the authorization is terminated or revoked sooner.  Performed at [facility], [address] [place], [place] [address]     CT HEAD WO CONTRAST  Addendum Date: 10/03/2019   ADDENDUM REPORT: 10/03/2019 18:02 ADDENDUM: Clinical team at the [facility] emergency Department called to discuss this case. There is acute hemorrhage layering dependently in the occipital horn of the left lateral ventricle. I do not see any evidence of intraparenchymal hemorrhage or subarachnoid blood elsewhere. Electronically Signed   By: Nelson Chimes M.D.   On: 10/03/2019 18:02   Result Date: 10/03/2019 CLINICAL DATA:  Status post fall. EXAM: CT HEAD WITHOUT CONTRAST CT CERVICAL SPINE WITHOUT CONTRAST TECHNIQUE: Multidetector CT imaging of the head and cervical spine was performed following the standard protocol without intravenous contrast. Multiplanar CT image reconstructions of the cervical spine were also generated. COMPARISON:  None. FINDINGS: CT HEAD FINDINGS Brain: No evidence of acute infarction, hemorrhage, hydrocephalus, extra-axial collection or mass lesion/mass effect. There is chronic diffuse atrophy. Chronic bilateral periventricular white matter small vessel ischemic changes are noted. Vascular: No hyperdense vessel is noted. Skull: Normal. Negative for fracture or focal lesion. Sinuses/Orbits: No acute finding. Other: None. CT CERVICAL SPINE FINDINGS Alignment: Normal. Skull  base and vertebrae: No acute fracture. Patient status post anterior fusion of C3 through C7 without malalignment. Soft tissues and spinal canal: No prevertebral fluid or swelling. No visible canal hematoma. Disc levels:  Status post prior fusion of C3 through C7. Upper chest: Emphysematous changes of lung apices are noted. Other: None. IMPRESSION: 1. No focal acute intracranial abnormality identified. 2. Chronic diffuse atrophy and chronic bilateral periventricular white matter small vessel ischemic change. 3. No acute fracture or dislocation of cervical spine. 4. Status post prior anterior fusion of C3 through C7 without malalignment. Electronically Signed: By: Abelardo Diesel M.D. On: 10/03/2019 14:56   CT CHEST W CONTRAST  Result Date: 10/03/2019 CLINICAL DATA:  Abdominal trauma. EXAM: CT CHEST, ABDOMEN, AND PELVIS WITH CONTRAST TECHNIQUE: Multidetector CT imaging of the chest, abdomen and pelvis was performed following the standard protocol during bolus administration of intravenous contrast. CONTRAST:  17mL OMNIPAQUE IOHEXOL 300 MG/ML  SOLN COMPARISON:  December 23, 2008. FINDINGS: CT CHEST FINDINGS Cardiovascular: Atherosclerosis of thoracic aorta is noted without aneurysm or dissection. Normal cardiac size. No pericardial effusion. Coronary artery calcifications are noted. Mediastinum/Nodes: No enlarged mediastinal, hilar, or axillary lymph nodes. Thyroid gland, trachea, and esophagus demonstrate no significant findings. Lungs/Pleura: No pneumothorax or pleural effusion is noted. Emphysematous disease is noted in both lungs. Calcified granuloma is noted in right lung base which is unchanged compared to prior exam. 7 mm subpleural nodule is noted posteriorly in the left lower  lobe best seen on image number 106 of series 4. Musculoskeletal: No chest wall mass or suspicious bone lesions identified. CT ABDOMEN PELVIS FINDINGS Hepatobiliary: No focal liver abnormality is seen. No gallstones, gallbladder wall  thickening, or biliary dilatation. Pancreas: Unremarkable. No pancreatic ductal dilatation or surrounding inflammatory changes. Spleen: Calcified splenic granulomata are noted. No acute abnormality is seen. Adrenals/Urinary Tract: Adrenal glands are unremarkable. Kidneys are normal, without renal calculi, focal lesion, or hydronephrosis. Bladder is unremarkable. Stomach/Bowel: Stomach is within normal limits. Appendix appears normal. No evidence of bowel wall thickening, distention, or inflammatory changes. Vascular/Lymphatic: Aortic atherosclerosis. No enlarged abdominal or pelvic lymph nodes. Reproductive: Prostate is unremarkable. Other: No abdominal wall hernia or abnormality. No abdominopelvic ascites. Musculoskeletal: No acute or significant osseous findings. IMPRESSION: 1. Coronary artery calcifications are noted suggesting coronary artery disease. 2. 7 mm subpleural nodule is noted posteriorly in the left lower lobe. Non-contrast chest CT at 6-12 months is recommended. If the nodule is stable at time of repeat CT, then future CT at 18-24 months (from today's scan) is considered optional for low-risk patients, but is recommended for high-risk patients. This recommendation follows the consensus statement: Guidelines for Management of Incidental Pulmonary Nodules Detected on CT Images: From the Fleischner Society 2017; Radiology 2017; 284:228-243. 3. No other abnormality seen in the chest, abdomen or pelvis. Aortic Atherosclerosis (ICD10-I70.0) and Emphysema (ICD10-J43.9). Electronically Signed   By: Marijo Conception M.D.   On: 10/03/2019 15:09   CT CERVICAL SPINE WO CONTRAST  Addendum Date: 10/03/2019   ADDENDUM REPORT: 10/03/2019 18:02 ADDENDUM: Clinical team at the [facility] emergency Department called to discuss this case. There is acute hemorrhage layering dependently in the occipital horn of the left lateral ventricle. I do not see any evidence of intraparenchymal hemorrhage or subarachnoid blood  elsewhere. Electronically Signed   By: Nelson Chimes M.D.   On: 10/03/2019 18:02   Result Date: 10/03/2019 CLINICAL DATA:  Status post fall. EXAM: CT HEAD WITHOUT CONTRAST CT CERVICAL SPINE WITHOUT CONTRAST TECHNIQUE: Multidetector CT imaging of the head and cervical spine was performed following the standard protocol without intravenous contrast. Multiplanar CT image reconstructions of the cervical spine were also generated. COMPARISON:  None. FINDINGS: CT HEAD FINDINGS Brain: No evidence of acute infarction, hemorrhage, hydrocephalus, extra-axial collection or mass lesion/mass effect. There is chronic diffuse atrophy. Chronic bilateral periventricular white matter small vessel ischemic changes are noted. Vascular: No hyperdense vessel is noted. Skull: Normal. Negative for fracture or focal lesion. Sinuses/Orbits: No acute finding. Other: None. CT CERVICAL SPINE FINDINGS Alignment: Normal. Skull base and vertebrae: No acute fracture. Patient status post anterior fusion of C3 through C7 without malalignment. Soft tissues and spinal canal: No prevertebral fluid or swelling. No visible canal hematoma. Disc levels:  Status post prior fusion of C3 through C7. Upper chest: Emphysematous changes of lung apices are noted. Other: None. IMPRESSION: 1. No focal acute intracranial abnormality identified. 2. Chronic diffuse atrophy and chronic bilateral periventricular white matter small vessel ischemic change. 3. No acute fracture or dislocation of cervical spine. 4. Status post prior anterior fusion of C3 through C7 without malalignment. Electronically Signed: By: Abelardo Diesel M.D. On: 10/03/2019 14:56   CT ABDOMEN PELVIS W CONTRAST  Result Date: 10/03/2019 CLINICAL DATA:  Abdominal trauma. EXAM: CT CHEST, ABDOMEN, AND PELVIS WITH CONTRAST TECHNIQUE: Multidetector CT imaging of the chest, abdomen and pelvis was performed following the standard protocol during bolus administration of intravenous contrast. CONTRAST:  117mL  OMNIPAQUE IOHEXOL 300 MG/ML  SOLN COMPARISON:  December 23, 2008. FINDINGS: CT CHEST FINDINGS Cardiovascular: Atherosclerosis of thoracic aorta is noted without aneurysm or dissection. Normal cardiac size. No pericardial effusion. Coronary artery calcifications are noted. Mediastinum/Nodes: No enlarged mediastinal, hilar, or axillary lymph nodes. Thyroid gland, trachea, and esophagus demonstrate no significant findings. Lungs/Pleura: No pneumothorax or pleural effusion is noted. Emphysematous disease is noted in both lungs. Calcified granuloma is noted in right lung base which is unchanged compared to prior exam. 7 mm subpleural nodule is noted posteriorly in the left lower lobe best seen on image number 106 of series 4. Musculoskeletal: No chest wall mass or suspicious bone lesions identified. CT ABDOMEN PELVIS FINDINGS Hepatobiliary: No focal liver abnormality is seen. No gallstones, gallbladder wall thickening, or biliary dilatation. Pancreas: Unremarkable. No pancreatic ductal dilatation or surrounding inflammatory changes. Spleen: Calcified splenic granulomata are noted. No acute abnormality is seen. Adrenals/Urinary Tract: Adrenal glands are unremarkable. Kidneys are normal, without renal calculi, focal lesion, or hydronephrosis. Bladder is unremarkable. Stomach/Bowel: Stomach is within normal limits. Appendix appears normal. No evidence of bowel wall thickening, distention, or inflammatory changes. Vascular/Lymphatic: Aortic atherosclerosis. No enlarged abdominal or pelvic lymph nodes. Reproductive: Prostate is unremarkable. Other: No abdominal wall hernia or abnormality. No abdominopelvic ascites. Musculoskeletal: No acute or significant osseous findings. IMPRESSION: 1. Coronary artery calcifications are noted suggesting coronary artery disease. 2. 7 mm subpleural nodule is noted posteriorly in the left lower lobe. Non-contrast chest CT at 6-12 months is recommended. If the nodule is stable at time of  repeat CT, then future CT at 18-24 months (from today's scan) is considered optional for low-risk patients, but is recommended for high-risk patients. This recommendation follows the consensus statement: Guidelines for Management of Incidental Pulmonary Nodules Detected on CT Images: From the Fleischner Society 2017; Radiology 2017; 284:228-243. 3. No other abnormality seen in the chest, abdomen or pelvis. Aortic Atherosclerosis (ICD10-I70.0) and Emphysema (ICD10-J43.9). Electronically Signed   By: Marijo Conception M.D.   On: 10/03/2019 15:09   DG Pelvis Portable  Result Date: 10/03/2019 CLINICAL DATA:  Status post fall this morning. EXAM: PORTABLE PELVIS 1-2 VIEWS COMPARISON:  None. FINDINGS: There is no evidence of pelvic fracture or diastasis. Mild degenerative joint changes of bilateral hips with narrowed joint space and osteophyte formation are noted. IMPRESSION: No acute fracture or dislocation. Electronically Signed   By: Abelardo Diesel M.D.   On: 10/03/2019 13:15   DG Chest Port 1 View  Result Date: 10/03/2019 CLINICAL DATA:  Right chest pain EXAM: PORTABLE CHEST 1 VIEW COMPARISON:  03/18/2018 FINDINGS: The lungs are well expanded and are symmetric. There has developed bibasilar reticulonodular infiltrate, a finding can be seen with atypical infection or aspiration. No pneumothorax or pleural effusion. Cardiac size within normal limits. Pulmonary vascularity normal. No acute bone abnormality. Cervical fusion hardware again noted. IMPRESSION: Interval development of bibasilar reticulonodular infiltrate suspicious for atypical infection. Electronically Signed   By: Fidela Salisbury MD   On: 10/03/2019 13:16    Review of Systems - Per HPI  Blood pressure (!) 157/85, pulse (!) 101, temperature 97.9 F (36.6 C), resp. rate 18, height 5\' 8"  (1.727 m), weight 73 kg, SpO2 94 %. Physical Exam  Neurological:     Mental Status: He is alert.     GCS: GCS eye subscore is 4. GCS verbal subscore is 5. GCS motor  subscore is 6.     Comments: Speech is clear and goal oriented, follows commands Major Cranial nerves without deficit, no facial droop  Moves extremities without ataxia, coordination intact   Assessment/Plan: The patient is at low risk for neurological deterioration and does not need any neurosurgical treatment at this time. He is not in need of transferring to [facility] and I feel that admission to [facility] for observation overnight would be appropriate. Stop Plavix. Follow up CT head is only needed if patient has a neurological decline.   Peggyann Shoals, MD 10/04/2019, 1:15 AM

## 2019-10-04 NOTE — Progress Notes (Signed)
Hypoglycemic Event  CBG: 64  Treatment: 8oz of orange juice; 1 pack of graham crackers  Symptoms: none; routine check  Follow-up CBG: Time: 22:21 CBG Result: 80  Possible Reasons for Event: poor apetite Comments/MD notified: Dr. Meyer Russel, Darius Fillingim L

## 2019-10-04 NOTE — Consult Note (Signed)
Requesting Physician: Dr. Olevia Bowens    Chief Complaint: Seizure  History obtained from: Patient and Chart     HPI:                                                                                                                                       Terry Munoz is a 69 y.o. male with past medical history significant for hypertension, hyperlipidemia, hyponatremia, diabetes mellitus presents to [facility] emergency department after having a fall from the back of his pickup truck resulting in a small amount of intraventricular hemorrhage and questionable seizure activity.  Patient remembers that he was trying to get off from the back of his pickup truck when he suddenly tripped and fell to the ground.  Patient does not recollect anything from that point.  Wife who was there extreme noticed the patient was having tonic-clonic jerking movements lasting for a few minutes.  Patient did not suffer tongue bite or urinary incontinence.  Wife stated she called EMS immediately, the time she got off the phone was no longer jerking but was still confused after the event.  Patient taken to [facility], ED and CT head shows small volume of acute blood in the left occipital horn.  Neurosurgery was consulted and patient was recommended to be admitted for observation and repeat CT scan.  Due to history concerning for seizure neurology was consulted.  Patient has no prior history of seizures.  Patient is completely back to his normal mental status apart from mild slurred speech.  No family history of seizures and no prior head trauma.   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   Hyperlipidemia 10/04/2019   Hypertension    Hyponatremia 03/2014   Insomnia    Kidney stones    Mouth ulcers    Osteoarthritis    Otitis media    Peripheral vascular disease (HCC)    Sinusitis     Past  Surgical History:  Procedure Laterality Date   DENTAL SURGERY     FEMORAL-POPLITEAL BYPASS GRAFT Right 07/24/2015   Procedure: Thrombectomy Right Femoral-Popliteal Artery; Thrombectomy Right Anterior-Tibial Artery, Thrombectomy Right Tibial-Peroneal Trunk; Bovine Patch Angioplasty Right Popliteal Artery; Endarterectomy Right Anterior Tibial Artery and Right Tibial-Peroneal Trunk; Right Leg Angiogram, Four Compartment Fasciotomy Right Lower Leg;  Surgeon: [place], MD;  Location: [place];  Service: Vascular;  L   HERNIA REPAIR     lithotomy     LOWER EXTREMITY ANGIOGRAM N/A 05/01/2014   Procedure: LOWER EXTREMITY ANGIOGRAM;  Surgeon: Laverda Page, MD;  Location: [facility] CATH LAB;  Service: Cardiovascular;  Laterality: N/A;   NECK SURGERY     POPLITEAL ARTERY ANGIOPLASTY Right 05/02/2014   to mid sfa   THROMBECTOMY  05/02/2014    Family History  Problem Relation  Age of Onset   Heart disease Mother    Hypertension Mother    Hypertension Father    Hypertension Sister    Hypertension Brother    Diabetes Brother    Hypertension Sister    Social History:  reports that he has been smoking cigarettes. He has a 90.00 pack-year smoking history. He has never used smokeless tobacco. He reports current alcohol use of about 24.0 standard drinks of alcohol per week. He reports that he does not use drugs.  Allergies:  Allergies  Allergen Reactions   Sulfa Antibiotics Hives    Medications:                                                                                                                        I reviewed home medications   ROS:                                                                                                                                     14 systems reviewed and negative except above    Examination:                                                                                                      General: Appears well-developed and  well-nourished.  Psych: Affect appropriate to situation Eyes: No scleral injection HENT: No OP obstrucion Head: Normocephalic.  Cardiovascular: Normal rate and regular rhythm.  Respiratory: Effort normal and breath sounds normal to anterior ascultation GI: Soft.  No distension. There is no tenderness.  Skin: Multiple bruises    Neurological Examination Mental Status: Alert, oriented, thought content appropriate.  Speech fluent without evidence of aphasia. Able to follow 3 step commands without difficulty. Cranial Nerves: II: Visual fields grossly normal,  III,IV, VI: ptosis not present, extra-ocular motions intact bilaterally, pupils equal, round, reactive to light and accommodation V,VII: smile symmetric, facial light touch sensation normal bilaterally VIII: hearing normal bilaterally IX,X: uvula rises symmetrically XI: bilateral shoulder shrug XII: midline  tongue extension Motor: Right : Upper extremity   5/5    Left:     Upper extremity   5/5  Lower extremity   5/5     Lower extremity   5/5 Tone and bulk:normal tone throughout; no atrophy noted Sensory: Pinprick and light touch intact throughout, bilaterally Deep Tendon Reflexes: 2+ and symmetric throughout Plantars: Right: downgoing   Left: downgoing Cerebellar: normal finger-to-nose, normal rapid alternating movements and normal heel-to-shin test Gait: normal gait and station     Lab Results: Basic Metabolic Panel: Recent Labs  Lab 10/03/19 1309 10/03/19 1318 10/04/19 0656 10/04/19 1329  NA 137 138 137 136  K 3.9 4.1 3.9 3.7  CL 98 97* 98 98  CO2 27  --  27 29  GLUCOSE 81 82 113* 187*  BUN 13 12 15 15   CREATININE 1.24 1.30* 1.17 1.10  CALCIUM 9.1  --  9.0 8.7*  MG  --   --   --  1.9  PHOS  --   --   --  3.2    CBC: Recent Labs  Lab 10/03/19 1309 10/03/19 1318 10/04/19 0656 10/04/19 1329  WBC 12.5*  --  9.4 7.2  HGB 16.5 16.3 15.6 15.3  HCT 48.6 48.0 46.9 45.3  MCV 100.6*  --  100.9* 101.1*  PLT  185  --  169 170    Coagulation Studies: Recent Labs    10/03/19 1309  LABPROT 12.4  INR 1.0    Imaging: CT HEAD WO CONTRAST  Addendum Date: 10/03/2019   ADDENDUM REPORT: 10/03/2019 18:02 ADDENDUM: Clinical team at the [facility] emergency Department called to discuss this case. There is acute hemorrhage layering dependently in the occipital horn of the left lateral ventricle. I do not see any evidence of intraparenchymal hemorrhage or subarachnoid blood elsewhere. Electronically Signed   By: Nelson Chimes M.D.   On: 10/03/2019 18:02   Result Date: 10/03/2019 CLINICAL DATA:  Status post fall. EXAM: CT HEAD WITHOUT CONTRAST CT CERVICAL SPINE WITHOUT CONTRAST TECHNIQUE: Multidetector CT imaging of the head and cervical spine was performed following the standard protocol without intravenous contrast. Multiplanar CT image reconstructions of the cervical spine were also generated. COMPARISON:  None. FINDINGS: CT HEAD FINDINGS Brain: No evidence of acute infarction, hemorrhage, hydrocephalus, extra-axial collection or mass lesion/mass effect. There is chronic diffuse atrophy. Chronic bilateral periventricular white matter small vessel ischemic changes are noted. Vascular: No hyperdense vessel is noted. Skull: Normal. Negative for fracture or focal lesion. Sinuses/Orbits: No acute finding. Other: None. CT CERVICAL SPINE FINDINGS Alignment: Normal. Skull base and vertebrae: No acute fracture. Patient status post anterior fusion of C3 through C7 without malalignment. Soft tissues and spinal canal: No prevertebral fluid or swelling. No visible canal hematoma. Disc levels:  Status post prior fusion of C3 through C7. Upper chest: Emphysematous changes of lung apices are noted. Other: None. IMPRESSION: 1. No focal acute intracranial abnormality identified. 2. Chronic diffuse atrophy and chronic bilateral periventricular white matter small vessel ischemic change. 3. No acute fracture or dislocation of cervical spine.  4. Status post prior anterior fusion of C3 through C7 without malalignment. Electronically Signed: By: Abelardo Diesel M.D. On: 10/03/2019 14:56   CT CHEST W CONTRAST  Result Date: 10/03/2019 CLINICAL DATA:  Abdominal trauma. EXAM: CT CHEST, ABDOMEN, AND PELVIS WITH CONTRAST TECHNIQUE: Multidetector CT imaging of the chest, abdomen and pelvis was performed following the standard protocol during bolus administration of intravenous contrast. CONTRAST:  156mL OMNIPAQUE IOHEXOL 300 MG/ML  SOLN COMPARISON:  December 23, 2008. FINDINGS: CT CHEST FINDINGS Cardiovascular: Atherosclerosis of thoracic aorta is noted without aneurysm or dissection. Normal cardiac size. No pericardial effusion. Coronary artery calcifications are noted. Mediastinum/Nodes: No enlarged mediastinal, hilar, or axillary lymph nodes. Thyroid gland, trachea, and esophagus demonstrate no significant findings. Lungs/Pleura: No pneumothorax or pleural effusion is noted. Emphysematous disease is noted in both lungs. Calcified granuloma is noted in right lung base which is unchanged compared to prior exam. 7 mm subpleural nodule is noted posteriorly in the left lower lobe best seen on image number 106 of series 4. Musculoskeletal: No chest wall mass or suspicious bone lesions identified. CT ABDOMEN PELVIS FINDINGS Hepatobiliary: No focal liver abnormality is seen. No gallstones, gallbladder wall thickening, or biliary dilatation. Pancreas: Unremarkable. No pancreatic ductal dilatation or surrounding inflammatory changes. Spleen: Calcified splenic granulomata are noted. No acute abnormality is seen. Adrenals/Urinary Tract: Adrenal glands are unremarkable. Kidneys are normal, without renal calculi, focal lesion, or hydronephrosis. Bladder is unremarkable. Stomach/Bowel: Stomach is within normal limits. Appendix appears normal. No evidence of bowel wall thickening, distention, or inflammatory changes. Vascular/Lymphatic: Aortic atherosclerosis. No enlarged  abdominal or pelvic lymph nodes. Reproductive: Prostate is unremarkable. Other: No abdominal wall hernia or abnormality. No abdominopelvic ascites. Musculoskeletal: No acute or significant osseous findings. IMPRESSION: 1. Coronary artery calcifications are noted suggesting coronary artery disease. 2. 7 mm subpleural nodule is noted posteriorly in the left lower lobe. Non-contrast chest CT at 6-12 months is recommended. If the nodule is stable at time of repeat CT, then future CT at 18-24 months (from today's scan) is considered optional for low-risk patients, but is recommended for high-risk patients. This recommendation follows the consensus statement: Guidelines for Management of Incidental Pulmonary Nodules Detected on CT Images: From the Fleischner Society 2017; Radiology 2017; 284:228-243. 3. No other abnormality seen in the chest, abdomen or pelvis. Aortic Atherosclerosis (ICD10-I70.0) and Emphysema (ICD10-J43.9). Electronically Signed   By: Marijo Conception M.D.   On: 10/03/2019 15:09   CT CERVICAL SPINE WO CONTRAST  Addendum Date: 10/03/2019   ADDENDUM REPORT: 10/03/2019 18:02 ADDENDUM: Clinical team at the [facility] emergency Department called to discuss this case. There is acute hemorrhage layering dependently in the occipital horn of the left lateral ventricle. I do not see any evidence of intraparenchymal hemorrhage or subarachnoid blood elsewhere. Electronically Signed   By: Nelson Chimes M.D.   On: 10/03/2019 18:02   Result Date: 10/03/2019 CLINICAL DATA:  Status post fall. EXAM: CT HEAD WITHOUT CONTRAST CT CERVICAL SPINE WITHOUT CONTRAST TECHNIQUE: Multidetector CT imaging of the head and cervical spine was performed following the standard protocol without intravenous contrast. Multiplanar CT image reconstructions of the cervical spine were also generated. COMPARISON:  None. FINDINGS: CT HEAD FINDINGS Brain: No evidence of acute infarction, hemorrhage, hydrocephalus, extra-axial collection or mass  lesion/mass effect. There is chronic diffuse atrophy. Chronic bilateral periventricular white matter small vessel ischemic changes are noted. Vascular: No hyperdense vessel is noted. Skull: Normal. Negative for fracture or focal lesion. Sinuses/Orbits: No acute finding. Other: None. CT CERVICAL SPINE FINDINGS Alignment: Normal. Skull base and vertebrae: No acute fracture. Patient status post anterior fusion of C3 through C7 without malalignment. Soft tissues and spinal canal: No prevertebral fluid or swelling. No visible canal hematoma. Disc levels:  Status post prior fusion of C3 through C7. Upper chest: Emphysematous changes of lung apices are noted. Other: None. IMPRESSION: 1. No focal acute intracranial abnormality identified. 2. Chronic diffuse atrophy and chronic bilateral periventricular white  matter small vessel ischemic change. 3. No acute fracture or dislocation of cervical spine. 4. Status post prior anterior fusion of C3 through C7 without malalignment. Electronically Signed: By: Abelardo Diesel M.D. On: 10/03/2019 14:56   CT ABDOMEN PELVIS W CONTRAST  Result Date: 10/03/2019 CLINICAL DATA:  Abdominal trauma. EXAM: CT CHEST, ABDOMEN, AND PELVIS WITH CONTRAST TECHNIQUE: Multidetector CT imaging of the chest, abdomen and pelvis was performed following the standard protocol during bolus administration of intravenous contrast. CONTRAST:  112mL OMNIPAQUE IOHEXOL 300 MG/ML  SOLN COMPARISON:  December 23, 2008. FINDINGS: CT CHEST FINDINGS Cardiovascular: Atherosclerosis of thoracic aorta is noted without aneurysm or dissection. Normal cardiac size. No pericardial effusion. Coronary artery calcifications are noted. Mediastinum/Nodes: No enlarged mediastinal, hilar, or axillary lymph nodes. Thyroid gland, trachea, and esophagus demonstrate no significant findings. Lungs/Pleura: No pneumothorax or pleural effusion is noted. Emphysematous disease is noted in both lungs. Calcified granuloma is noted in right lung  base which is unchanged compared to prior exam. 7 mm subpleural nodule is noted posteriorly in the left lower lobe best seen on image number 106 of series 4. Musculoskeletal: No chest wall mass or suspicious bone lesions identified. CT ABDOMEN PELVIS FINDINGS Hepatobiliary: No focal liver abnormality is seen. No gallstones, gallbladder wall thickening, or biliary dilatation. Pancreas: Unremarkable. No pancreatic ductal dilatation or surrounding inflammatory changes. Spleen: Calcified splenic granulomata are noted. No acute abnormality is seen. Adrenals/Urinary Tract: Adrenal glands are unremarkable. Kidneys are normal, without renal calculi, focal lesion, or hydronephrosis. Bladder is unremarkable. Stomach/Bowel: Stomach is within normal limits. Appendix appears normal. No evidence of bowel wall thickening, distention, or inflammatory changes. Vascular/Lymphatic: Aortic atherosclerosis. No enlarged abdominal or pelvic lymph nodes. Reproductive: Prostate is unremarkable. Other: No abdominal wall hernia or abnormality. No abdominopelvic ascites. Musculoskeletal: No acute or significant osseous findings. IMPRESSION: 1. Coronary artery calcifications are noted suggesting coronary artery disease. 2. 7 mm subpleural nodule is noted posteriorly in the left lower lobe. Non-contrast chest CT at 6-12 months is recommended. If the nodule is stable at time of repeat CT, then future CT at 18-24 months (from today's scan) is considered optional for low-risk patients, but is recommended for high-risk patients. This recommendation follows the consensus statement: Guidelines for Management of Incidental Pulmonary Nodules Detected on CT Images: From the Fleischner Society 2017; Radiology 2017; 284:228-243. 3. No other abnormality seen in the chest, abdomen or pelvis. Aortic Atherosclerosis (ICD10-I70.0) and Emphysema (ICD10-J43.9). Electronically Signed   By: Marijo Conception M.D.   On: 10/03/2019 15:09   DG Pelvis  Portable  Result Date: 10/03/2019 CLINICAL DATA:  Status post fall this morning. EXAM: PORTABLE PELVIS 1-2 VIEWS COMPARISON:  None. FINDINGS: There is no evidence of pelvic fracture or diastasis. Mild degenerative joint changes of bilateral hips with narrowed joint space and osteophyte formation are noted. IMPRESSION: No acute fracture or dislocation. Electronically Signed   By: Abelardo Diesel M.D.   On: 10/03/2019 13:15   DG Chest Port 1 View  Result Date: 10/03/2019 CLINICAL DATA:  Right chest pain EXAM: PORTABLE CHEST 1 VIEW COMPARISON:  03/18/2018 FINDINGS: The lungs are well expanded and are symmetric. There has developed bibasilar reticulonodular infiltrate, a finding can be seen with atypical infection or aspiration. No pneumothorax or pleural effusion. Cardiac size within normal limits. Pulmonary vascularity normal. No acute bone abnormality. Cervical fusion hardware again noted. IMPRESSION: Interval development of bibasilar reticulonodular infiltrate suspicious for atypical infection. Electronically Signed   By: Fidela Salisbury MD   On: 10/03/2019 13:16  I have reviewed the above imaging : Reviewed CT head   ASSESSMENT AND PLAN   69 y.o. male with past medical history significant for hypertension, hyperlipidemia, hyponatremia, diabetes mellitus presents to [facility] emergency department after having a fall from the back of his pickup truck resulting in a small amount of intraventricular hemorrhage and questionable seizure activity.  This patient remembers tripping and falling, unlikely seizure was responsible for patient's fall and subsequent head trauma.  Suspect patient had a seizure as a result of head trauma instead.  Of note patient has bibasilar reticulonodular infiltrates, unclear if this is just from aspiration while having a seizure versus atypical infection I would defer this for medicine team to work-up.  Likely traumatic seizure  Recommendations -EEG tomorrow  morning -Observation for more seizures -No antiepileptic agent -Seizure precautions including no driving for 6 months   Per [facility] statutes, patients with seizures are not allowed to drive until they have been seizure-free for six months. Use caution when using heavy equipment or power tools. Avoid working on ladders or at heights. Take showers instead of baths. Ensure the water temperature is not too high on the home water heater. Do not go swimming alone. Do not lock yourself in a room alone (i.e. bathroom). When caring for infants or small children, sit down when holding, feeding, or changing them to minimize risk of injury to the child in the event you have a seizure. Maintain good sleep hygiene. Avoid alcohol.    If Terry Munoz has another seizure, call 911 and bring them back to the ED if:       A.  The seizure lasts longer than 5 minutes.            B.  The patient doesn't wake shortly after the seizure or has new problems such as difficulty seeing, speaking or moving following the seizure       C.  The patient was injured during the seizure       D.  The patient has a temperature over 102 F (39C)       E.  The patient vomited during the seizure and now is having trouble breathing   Klarissa Mcilvain [facility] Pager Number [phone]

## 2019-10-04 NOTE — TOC Initial Note (Signed)
Transition of Care [facility]) - Initial/Assessment Note    Patient Details  Name: Terry Munoz MRN: [id number] Date of Birth: 07/03/1950  [facility] CM/SW Contact:    Natasha Bence, LCSW Phone Number: 10/04/2019, 3:49 PM  Clinical Narrative:                 Patient is a 12 male admitted for Intracranial bleed. Patient reported that he lives at home with his wife. Patient also reported that he has a hx with home health after a previous surgery. Due to patient's ambulatory status and ability to meet his ADL's regularly, patient expressed that he did not feel that he would need SNF or Home health upon discharge. TOC received SA consult for patient. Patient accepting of referral list provided.  TOC to follow.   Expected Discharge Plan: Home/Self Care Barriers to Discharge: Continued Medical Work up   Patient Goals and CMS Choice Patient states their goals for this hospitalization and ongoing recovery are:: Return home      Expected Discharge Plan and Services Expected Discharge Plan: Home/Self Care                                              Prior Living Arrangements/Services   Lives with:: Significant Other          Need for Family Participation in Patient Care: No (Comment) Care giver support system in place?: No (comment)      Activities of Daily Living Home Assistive Devices/Equipment: CBG Meter, Dentures (specify type), Crutches, Cane (specify quad or straight), Walker (specify type), Wheelchair, Shower chair with back ADL Screening (condition at time of admission) Patient's cognitive ability adequate to safely complete daily activities?: Yes Is the patient deaf or have difficulty hearing?: No Does the patient have difficulty seeing, even when wearing glasses/contacts?: No Does the patient have difficulty concentrating, remembering, or making decisions?: No Patient able to express need for assistance with ADLs?: Yes Does the patient have  difficulty dressing or bathing?: No Independently performs ADLs?: Yes (appropriate for developmental age) Does the patient have difficulty walking or climbing stairs?: No Weakness of Legs: None Weakness of Arms/Hands: None  Permission Sought/Granted                  Emotional Assessment     Affect (typically observed): Accepting, Adaptable Orientation: : Oriented to Self, Oriented to Place, Oriented to  Time, Oriented to Situation, Fluctuating Orientation (Suspected and/or reported Sundowners) Alcohol / Substance Use: Not Applicable Psych Involvement: No (comment)  Admission diagnosis:  Intracranial hemorrhage (HCC) [I62.9] Intracranial bleed (HCC) [I62.9] ICH (intracerebral hemorrhage) ([place]) [I61.9] Patient Active Problem List   Diagnosis Date Noted  . Lung nodule 10/04/2019  . Hyperlipidemia 10/04/2019  . ICH (intracerebral hemorrhage) ([place]) 10/04/2019  . Intracranial bleed ([place]) 10/03/2019  . COPD (chronic obstructive pulmonary disease) ([place])   . Leukocytosis   . 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 PVD (peripheral vascular disease) with claudication ([place]) 05/01/2014  . COPD exacerbation ([place]) 04/21/2014  . possible Aspiration pneumonia 04/21/2014  . DM type 2 (diabetes mellitus, type 2) ([place]) 04/21/2014  . Alcohol withdrawal delirium ([place]) 04/20/2014  . Diabetes mellitus, stable ([place]) 04/20/2014  . Essential hypertension 04/20/2014  . Hyponatremia 04/20/2014  . Tobacco abuse 04/20/2014  . Claudication of right lower extremity ([place]) 04/18/2014  . Peripheral artery disease (  [place]) 04/18/2014   PCP:  Aletha Halim., PA-C Pharmacy:   [facility] [address], [place] [address] [place] [address] Phone: [phone] Fax: [phone]  [facility] [place], [place] [address] Phone: [phone] Fax: [phone]  [place], [place] [address] [place] [address] Phone: [phone] Fax: [phone]  [facility] [place], [place] [address] [place] [address] Phone: [phone] Fax: [phone]     Social Determinants of Health (SDOH) Interventions    Readmission Risk Interventions No flowsheet data found.

## 2019-10-04 NOTE — Progress Notes (Signed)
Patient arrived to unit alert and oriented. Vital signs stable. Patient left in bed with phone and call bell within reach, and bed in lowest position with alarm on. Dinner ordered. Will continue to monitor. Gwendolyn Grant, RN

## 2019-10-04 NOTE — Progress Notes (Signed)
PROGRESS NOTE    Terry Munoz  VEL:[id number] DOB: [birth year] DOA: 10/03/2019 PCP: Aletha Halim., PA-C    Brief Narrative:  69 year old male who presents to the emergency room after a fall.  CT imaging of the head indicated hemorrhage in occipital horn of the left lateral ventricle.  Case was reviewed with neurosurgery who did not feel that neurosurgical intervention was needed at this time.  On further questioning, patient's wife reported that the cause of his fall was that patient was having a seizure.  This is new onset seizures.  Case reviewed with neurology at [facility] who agreed with transfer for EEG, MRI brain and further neurology evaluation.   Assessment & Plan:   Principal Problem:   Intracranial bleed (HCC) Active Problems:   Essential hypertension   Tobacco abuse   DM type 2 (diabetes mellitus, type 2) (HCC)   COPD (chronic obstructive pulmonary disease) (HCC)   Leukocytosis   Lung nodule   Hyperlipidemia   ICH (intracerebral hemorrhage) ([place])   1. Intracranial hemorrhage.  Noted on CT head that patient had hemorrhage in the occipital horn of the left lateral ventricle.  Plavix currently on hold.  Case was reviewed with neurosurgery who did not feel that surgical intervention was necessary at this time.  Currently, he does not have any neurologic deficits. 2. Seizure.  Patient's wife describes that after he fell to the ground from his truck, patient began having jerking episodes of upper and lower extremities which lasted approximately 5 minutes.  No urinary incontinence, tongue biting.  Patient was somnolent/lethargic for approximately 30 minutes after event.  No similar episodes in the past.  Case was reviewed with Dr. Leonel Ramsay and it was recommended that patient have EEG and MRI brain as well as being started on Keppra 500 mg twice daily for now.  Unfortunately, the service is not available at [facility] over the weekend.  Patient will be transferred to [facility]  Neurology will see in consult.  It is unlikely that his seizures related to alcohol withdrawal since he does not have any other signs of DTs and last drink was 24 hours before event. 3. History of alcohol use.  Patient's wife reports that patient drinks 2-3 beers and 5-6 mixed drinks every day.  His last drink was on Thursday afternoon.  No signs of withdrawal at this time.  He will be started on CIWA protocol. 4. Hypertension.  Continue on irbesartan.  Blood pressure stable. 5. Diabetes.  Continue Lantus.  Monitor on sliding scale insulin.  Blood sugar stable. 6. Hyperlipidemia.  Continue statin. 7. COPD with ongoing tobacco use.  Continue on bronchodilators.  No shortness of breath or wheezing at this time. 8. Left lower lobe lung nodule.  Repeat CT in 6 months as outpatient. 9. Peripheral vascular disease.  Followed by Dr. Einar Gip.  Chronically on Plavix which is on hold due to #1.   DVT prophylaxis: SCDs Start: 10/04/19 0001  Code Status: full code Family Communication: discussed with son and wife at the bedside Disposition Plan: Status is: Inpatient  Remains inpatient appropriate because:Ongoing diagnostic testing needed not appropriate for outpatient work up   Dispo: The patient is from: Home              Anticipated d/c is to: Home              Anticipated d/c date is: 1 day              Patient  currently is not medically stable to d/c.   Consultants:   Neurology, Dr. Leonel Ramsay  Procedures:     Antimicrobials:      Subjective: Patient is feeling well.  Denies any changes in vision.  No headache.  Objective: Vitals:   10/04/19 0518 10/04/19 0748 10/04/19 1401 10/04/19 1756  BP: (!) 153/97  135/82 140/81  Pulse: (!) 109  87 85  Resp: 16  18 20   Temp: 97.7 F (36.5 C)  98.3 F (36.8 C) 97.7 F (36.5 C)  TempSrc: Oral  Oral Oral  SpO2: 93% 93% 91% 95%  Weight:      Height:        Intake/Output Summary (Last 24 hours) at 10/04/2019 1803 Last data  filed at 10/04/2019 0900 Gross per 24 hour  Intake 240 ml  Output 1 ml  Net 239 ml   Filed Weights   10/03/19 1939 10/04/19 0114  Weight: 73 kg 71.8 kg    Examination:  General exam: Appears calm and comfortable  Respiratory system: Clear to auscultation. Respiratory effort normal. Cardiovascular system: S1 & S2 heard, RRR. No JVD, murmurs, rubs, gallops or clicks. No pedal edema. Gastrointestinal system: Abdomen is nondistended, soft and nontender. No organomegaly or masses felt. Normal bowel sounds heard. Central nervous system: Alert and oriented. No focal neurological deficits. Extremities: Symmetric 5 x 5 power. Skin: No rashes, lesions or ulcers Psychiatry: Judgement and insight appear normal. Mood & affect appropriate.     Data Reviewed: I have personally reviewed following labs and imaging studies  CBC: Recent Labs  Lab 10/03/19 1309 10/03/19 1318 10/04/19 0656 10/04/19 1329  WBC 12.5*  --  9.4 7.2  HGB 16.5 16.3 15.6 15.3  HCT 48.6 48.0 46.9 45.3  MCV 100.6*  --  100.9* 101.1*  PLT 185  --  169 623   Basic Metabolic Panel: Recent Labs  Lab 10/03/19 1309 10/03/19 1318 10/04/19 0656 10/04/19 1329  NA 137 138 137 136  K 3.9 4.1 3.9 3.7  CL 98 97* 98 98  CO2 27  --  27 29  GLUCOSE 81 82 113* 187*  BUN 13 12 15 15   CREATININE 1.24 1.30* 1.17 1.10  CALCIUM 9.1  --  9.0 8.7*  MG  --   --   --  1.9  PHOS  --   --   --  3.2   GFR: Estimated Creatinine Clearance: 60.5 mL/min (by C-G formula based on SCr of 1.1 mg/dL). Liver Function Tests: Recent Labs  Lab 10/03/19 1309 10/04/19 1329  AST 40 26  ALT 36 24  ALKPHOS 78 74  BILITOT 0.9 0.9  PROT 7.3 7.0  ALBUMIN 3.9 3.6   No results for input(s): LIPASE, AMYLASE in the last 168 hours. No results for input(s): AMMONIA in the last 168 hours. Coagulation Profile: Recent Labs  Lab 10/03/19 1309  INR 1.0   Cardiac Enzymes: No results for input(s): CKTOTAL, CKMB, CKMBINDEX, TROPONINI in the last  168 hours. BNP (last 3 results) No results for input(s): PROBNP in the last 8760 hours. HbA1C: Recent Labs    10/04/19 0656  HGBA1C 7.3*   CBG: Recent Labs  Lab 10/04/19 0757 10/04/19 1112  GLUCAP 123* 165*   Lipid Profile: No results for input(s): CHOL, HDL, LDLCALC, TRIG, CHOLHDL, LDLDIRECT in the last 72 hours. Thyroid Function Tests: No results for input(s): TSH, T4TOTAL, FREET4, T3FREE, THYROIDAB in the last 72 hours. Anemia Panel: No results for input(s): VITAMINB12, FOLATE, FERRITIN, TIBC, IRON, RETICCTPCT in  the last 72 hours. Sepsis Labs: Recent Labs  Lab 10/03/19 1309  LATICACIDVEN 1.9    Recent Results (from the past 240 hour(s))  SARS Coronavirus 2 by RT PCR (hospital order, performed in [facility] hospital lab) Nasopharyngeal Nasopharyngeal Swab     Status: None   Collection Time: 10/03/19  8:30 PM   Specimen: Nasopharyngeal Swab  Result Value Ref Range Status   SARS Coronavirus 2 NEGATIVE NEGATIVE Final    Comment: (NOTE) SARS-CoV-2 target nucleic acids are NOT DETECTED.  The SARS-CoV-2 RNA is generally detectable in upper and lower respiratory specimens during the acute phase of infection. The lowest concentration of SARS-CoV-2 viral copies this assay can detect is 250 copies / mL. A negative result does not preclude SARS-CoV-2 infection and should not be used as the sole basis for treatment or other patient management decisions.  A negative result may occur with improper specimen collection / handling, submission of specimen other than nasopharyngeal swab, presence of viral mutation(s) within the areas targeted by this assay, and inadequate number of viral copies (<250 copies / mL). A negative result must be combined with clinical observations, patient history, and epidemiological information.  Fact Sheet for Patients:   StrictlyIdeas.no  Fact Sheet for Healthcare  Providers: BankingDealers.co.za  This test is not yet approved or  cleared by the [place] FDA and has been authorized for detection and/or diagnosis of SARS-CoV-2 by FDA under an Emergency Use Authorization (EUA).  This EUA will remain in effect (meaning this test can be used) for the duration of the COVID-19 declaration under Section 564(b)(1) of the Act, 21 U.S.C. section 360bbb-3(b)(1), unless the authorization is terminated or revoked sooner.  Performed at [facility], [address] [place], [place] [address]          Radiology Studies: CT HEAD WO CONTRAST  Addendum Date: 10/03/2019   ADDENDUM REPORT: 10/03/2019 18:02 ADDENDUM: Clinical team at the [facility] emergency Department called to discuss this case. There is acute hemorrhage layering dependently in the occipital horn of the left lateral ventricle. I do not see any evidence of intraparenchymal hemorrhage or subarachnoid blood elsewhere. Electronically Signed   By: Terry Chimes M.D.   On: 10/03/2019 18:02   Result Date: 10/03/2019 CLINICAL DATA:  Status post fall. EXAM: CT HEAD WITHOUT CONTRAST CT CERVICAL SPINE WITHOUT CONTRAST TECHNIQUE: Multidetector CT imaging of the head and cervical spine was performed following the standard protocol without intravenous contrast. Multiplanar CT image reconstructions of the cervical spine were also generated. COMPARISON:  None. FINDINGS: CT HEAD FINDINGS Brain: No evidence of acute infarction, hemorrhage, hydrocephalus, extra-axial collection or mass lesion/mass effect. There is chronic diffuse atrophy. Chronic bilateral periventricular white matter small vessel ischemic changes are noted. Vascular: No hyperdense vessel is noted. Skull: Normal. Negative for fracture or focal lesion. Sinuses/Orbits: No acute finding. Other: None. CT CERVICAL SPINE FINDINGS Alignment: Normal. Skull base and vertebrae: No acute fracture. Patient status post anterior fusion of C3 through  C7 without malalignment. Soft tissues and spinal canal: No prevertebral fluid or swelling. No visible canal hematoma. Disc levels:  Status post prior fusion of C3 through C7. Upper chest: Emphysematous changes of lung apices are noted. Other: None. IMPRESSION: 1. No focal acute intracranial abnormality identified. 2. Chronic diffuse atrophy and chronic bilateral periventricular white matter small vessel ischemic change. 3. No acute fracture or dislocation of cervical spine. 4. Status post prior anterior fusion of C3 through C7 without malalignment. Electronically Signed: By: Mallie Darting.D.  On: 10/03/2019 14:56   CT CHEST W CONTRAST  Result Date: 10/03/2019 CLINICAL DATA:  Abdominal trauma. EXAM: CT CHEST, ABDOMEN, AND PELVIS WITH CONTRAST TECHNIQUE: Multidetector CT imaging of the chest, abdomen and pelvis was performed following the standard protocol during bolus administration of intravenous contrast. CONTRAST:  151mL OMNIPAQUE IOHEXOL 300 MG/ML  SOLN COMPARISON:  December 23, 2008. FINDINGS: CT CHEST FINDINGS Cardiovascular: Atherosclerosis of thoracic aorta is noted without aneurysm or dissection. Normal cardiac size. No pericardial effusion. Coronary artery calcifications are noted. Mediastinum/Nodes: No enlarged mediastinal, hilar, or axillary lymph nodes. Thyroid gland, trachea, and esophagus demonstrate no significant findings. Lungs/Pleura: No pneumothorax or pleural effusion is noted. Emphysematous disease is noted in both lungs. Calcified granuloma is noted in right lung base which is unchanged compared to prior exam. 7 mm subpleural nodule is noted posteriorly in the left lower lobe best seen on image number 106 of series 4. Musculoskeletal: No chest wall mass or suspicious bone lesions identified. CT ABDOMEN PELVIS FINDINGS Hepatobiliary: No focal liver abnormality is seen. No gallstones, gallbladder wall thickening, or biliary dilatation. Pancreas: Unremarkable. No pancreatic ductal dilatation  or surrounding inflammatory changes. Spleen: Calcified splenic granulomata are noted. No acute abnormality is seen. Adrenals/Urinary Tract: Adrenal glands are unremarkable. Kidneys are normal, without renal calculi, focal lesion, or hydronephrosis. Bladder is unremarkable. Stomach/Bowel: Stomach is within normal limits. Appendix appears normal. No evidence of bowel wall thickening, distention, or inflammatory changes. Vascular/Lymphatic: Aortic atherosclerosis. No enlarged abdominal or pelvic lymph nodes. Reproductive: Prostate is unremarkable. Other: No abdominal wall hernia or abnormality. No abdominopelvic ascites. Musculoskeletal: No acute or significant osseous findings. IMPRESSION: 1. Coronary artery calcifications are noted suggesting coronary artery disease. 2. 7 mm subpleural nodule is noted posteriorly in the left lower lobe. Non-contrast chest CT at 6-12 months is recommended. If the nodule is stable at time of repeat CT, then future CT at 18-24 months (from today's scan) is considered optional for low-risk patients, but is recommended for high-risk patients. This recommendation follows the consensus statement: Guidelines for Management of Incidental Pulmonary Nodules Detected on CT Images: From the Fleischner Society 2017; Radiology 2017; 284:228-243. 3. No other abnormality seen in the chest, abdomen or pelvis. Aortic Atherosclerosis (ICD10-I70.0) and Emphysema (ICD10-J43.9). Electronically Signed   By: Marijo Conception M.D.   On: 10/03/2019 15:09   CT CERVICAL SPINE WO CONTRAST  Addendum Date: 10/03/2019   ADDENDUM REPORT: 10/03/2019 18:02 ADDENDUM: Clinical team at the [facility] emergency Department called to discuss this case. There is acute hemorrhage layering dependently in the occipital horn of the left lateral ventricle. I do not see any evidence of intraparenchymal hemorrhage or subarachnoid blood elsewhere. Electronically Signed   By: Terry Chimes M.D.   On: 10/03/2019 18:02   Result Date:  10/03/2019 CLINICAL DATA:  Status post fall. EXAM: CT HEAD WITHOUT CONTRAST CT CERVICAL SPINE WITHOUT CONTRAST TECHNIQUE: Multidetector CT imaging of the head and cervical spine was performed following the standard protocol without intravenous contrast. Multiplanar CT image reconstructions of the cervical spine were also generated. COMPARISON:  None. FINDINGS: CT HEAD FINDINGS Brain: No evidence of acute infarction, hemorrhage, hydrocephalus, extra-axial collection or mass lesion/mass effect. There is chronic diffuse atrophy. Chronic bilateral periventricular white matter small vessel ischemic changes are noted. Vascular: No hyperdense vessel is noted. Skull: Normal. Negative for fracture or focal lesion. Sinuses/Orbits: No acute finding. Other: None. CT CERVICAL SPINE FINDINGS Alignment: Normal. Skull base and vertebrae: No acute fracture. Patient status post anterior fusion of C3 through  C7 without malalignment. Soft tissues and spinal canal: No prevertebral fluid or swelling. No visible canal hematoma. Disc levels:  Status post prior fusion of C3 through C7. Upper chest: Emphysematous changes of lung apices are noted. Other: None. IMPRESSION: 1. No focal acute intracranial abnormality identified. 2. Chronic diffuse atrophy and chronic bilateral periventricular white matter small vessel ischemic change. 3. No acute fracture or dislocation of cervical spine. 4. Status post prior anterior fusion of C3 through C7 without malalignment. Electronically Signed: By: Abelardo Diesel M.D. On: 10/03/2019 14:56   CT ABDOMEN PELVIS W CONTRAST  Result Date: 10/03/2019 CLINICAL DATA:  Abdominal trauma. EXAM: CT CHEST, ABDOMEN, AND PELVIS WITH CONTRAST TECHNIQUE: Multidetector CT imaging of the chest, abdomen and pelvis was performed following the standard protocol during bolus administration of intravenous contrast. CONTRAST:  149mL OMNIPAQUE IOHEXOL 300 MG/ML  SOLN COMPARISON:  December 23, 2008. FINDINGS: CT CHEST FINDINGS  Cardiovascular: Atherosclerosis of thoracic aorta is noted without aneurysm or dissection. Normal cardiac size. No pericardial effusion. Coronary artery calcifications are noted. Mediastinum/Nodes: No enlarged mediastinal, hilar, or axillary lymph nodes. Thyroid gland, trachea, and esophagus demonstrate no significant findings. Lungs/Pleura: No pneumothorax or pleural effusion is noted. Emphysematous disease is noted in both lungs. Calcified granuloma is noted in right lung base which is unchanged compared to prior exam. 7 mm subpleural nodule is noted posteriorly in the left lower lobe best seen on image number 106 of series 4. Musculoskeletal: No chest wall mass or suspicious bone lesions identified. CT ABDOMEN PELVIS FINDINGS Hepatobiliary: No focal liver abnormality is seen. No gallstones, gallbladder wall thickening, or biliary dilatation. Pancreas: Unremarkable. No pancreatic ductal dilatation or surrounding inflammatory changes. Spleen: Calcified splenic granulomata are noted. No acute abnormality is seen. Adrenals/Urinary Tract: Adrenal glands are unremarkable. Kidneys are normal, without renal calculi, focal lesion, or hydronephrosis. Bladder is unremarkable. Stomach/Bowel: Stomach is within normal limits. Appendix appears normal. No evidence of bowel wall thickening, distention, or inflammatory changes. Vascular/Lymphatic: Aortic atherosclerosis. No enlarged abdominal or pelvic lymph nodes. Reproductive: Prostate is unremarkable. Other: No abdominal wall hernia or abnormality. No abdominopelvic ascites. Musculoskeletal: No acute or significant osseous findings. IMPRESSION: 1. Coronary artery calcifications are noted suggesting coronary artery disease. 2. 7 mm subpleural nodule is noted posteriorly in the left lower lobe. Non-contrast chest CT at 6-12 months is recommended. If the nodule is stable at time of repeat CT, then future CT at 18-24 months (from today's scan) is considered optional for low-risk  patients, but is recommended for high-risk patients. This recommendation follows the consensus statement: Guidelines for Management of Incidental Pulmonary Nodules Detected on CT Images: From the Fleischner Society 2017; Radiology 2017; 284:228-243. 3. No other abnormality seen in the chest, abdomen or pelvis. Aortic Atherosclerosis (ICD10-I70.0) and Emphysema (ICD10-J43.9). Electronically Signed   By: Marijo Conception M.D.   On: 10/03/2019 15:09   DG Pelvis Portable  Result Date: 10/03/2019 CLINICAL DATA:  Status post fall this morning. EXAM: PORTABLE PELVIS 1-2 VIEWS COMPARISON:  None. FINDINGS: There is no evidence of pelvic fracture or diastasis. Mild degenerative joint changes of bilateral hips with narrowed joint space and osteophyte formation are noted. IMPRESSION: No acute fracture or dislocation. Electronically Signed   By: Abelardo Diesel M.D.   On: 10/03/2019 13:15   DG Chest Port 1 View  Result Date: 10/03/2019 CLINICAL DATA:  Right chest pain EXAM: PORTABLE CHEST 1 VIEW COMPARISON:  03/18/2018 FINDINGS: The lungs are well expanded and are symmetric. There has developed bibasilar reticulonodular infiltrate, a  finding can be seen with atypical infection or aspiration. No pneumothorax or pleural effusion. Cardiac size within normal limits. Pulmonary vascularity normal. No acute bone abnormality. Cervical fusion hardware again noted. IMPRESSION: Interval development of bibasilar reticulonodular infiltrate suspicious for atypical infection. Electronically Signed   By: Fidela Salisbury MD   On: 10/03/2019 13:16        Scheduled Meds: . amitriptyline  50 mg Oral QHS  . atorvastatin  40 mg Oral Daily  . empagliflozin  10 mg Oral Daily  . folic acid  1 mg Oral Daily  . insulin aspart  0-15 Units Subcutaneous TID WC  . insulin glargine  46 Units Subcutaneous QPC breakfast  . irbesartan  75 mg Oral Daily  . levETIRAcetam  500 mg Oral BID  . linagliptin  5 mg Oral Daily  . LORazepam  0-4 mg  Intravenous Q6H   Followed by  . [START ON 10/06/2019] LORazepam  0-4 mg Intravenous Q12H  . mometasone-formoterol  2 puff Inhalation BID  . multivitamin with minerals  1 tablet Oral Daily  . nicotine  21 mg Transdermal Daily  . pantoprazole  40 mg Oral Daily  . thiamine  100 mg Oral Daily   Or  . thiamine  100 mg Intravenous Daily  . thiamine  150 mg Oral Daily   Continuous Infusions:   LOS: 0 days    Time spent: 22mins    Kathie Dike, MD [facility]   If 7PM-7AM, please contact night-coverage [email or web address]  10/04/2019, 6:03 PM

## 2019-10-05 ENCOUNTER — Inpatient Hospital Stay (HOSPITAL_COMMUNITY): Payer: Medicare Other

## 2019-10-05 LAB — GLUCOSE, CAPILLARY
Glucose-Capillary: 123 mg/dL — ABNORMAL HIGH (ref 70–99)
Glucose-Capillary: 116 mg/dL — ABNORMAL HIGH (ref 70–99)
Glucose-Capillary: 155 mg/dL — ABNORMAL HIGH (ref 70–99)

## 2019-10-05 MED ORDER — ADULT MULTIVITAMIN W/MINERALS CH: 1.0000 | ORAL_TABLET | Freq: Every day | ORAL | 0 refills | Status: AC

## 2019-10-05 MED ORDER — FOLIC ACID 1 MG PO TABS: 1.0000 mg | ORAL_TABLET | Freq: Every day | ORAL | 0 refills | Status: AC

## 2019-10-05 NOTE — Progress Notes (Signed)
Patient discharge instructions discussed with patient. Wife at bedside. Iv removed and telemetry discontinued. Patient clothing returned to patient. Patient transported off unit via wheelchair for transport home. Gwendolyn Grant, RN

## 2019-10-05 NOTE — Procedures (Signed)
ELECTROENCEPHALOGRAM REPORT   Patient: Terry Munoz       Room #: 5V74M EEG No. ID: [id number] Age: 69 y.o.        Sex: male Requesting Physician: Shahmehdi Report Date:  10/05/2019        Interpreting Physician: Alexis Goodell  History: [name] is an 69 y.o. male with seizure noted after fall  Medications:  Keppra, Dulera, Tradjenta, Avapro, Insulin, Jardiance, Elavil  Conditions of Recording:  This is a 21 channel routine scalp EEG performed with bipolar and monopolar montages arranged in accordance to the international 10/20 system of electrode placement. One channel was dedicated to EKG recording.  The patient is in the awake and drowsy states.  Description:  The waking background activity consists of a low voltage, symmetrical, fairly well organized, 8 Hz alpha activity, seen from the parieto-occipital and posterior temporal regions.  Low voltage fast activity, poorly organized, is seen anteriorly and is at times superimposed on more posterior regions.  A mixture of theta and alpha rhythms are seen from the central and temporal regions. The patient drowses with slowing to irregular, low voltage theta and beta activity.   Stage II sleep is not obtained. No epileptiform activity is noted.   Hyperventilation was not performed.  Intermittent photic stimulation was performed but failed to illicit any change in the tracing.    IMPRESSION: Normal electroencephalogram, awake, drowsy and with activation procedures. There are no focal lateralizing or epileptiform features.   Alexis Goodell, MD Neurology [phone] 10/05/2019, 11:17 AM

## 2019-10-05 NOTE — Evaluation (Signed)
Physical Therapy Evaluation Patient Details Name: Terry Munoz MRN: [id number] DOB: 09-10-1950 Today's Date: 10/05/2019   History of Present Illness  Terry Munoz is a 69 y.o. male with medical history significant of COPD, type 2 diabetes, hypertension, hyponatremia, chronic insomnia, urolithiasis, history of mouth ulcers, osteoarthritis, history of otitis media, history of sinusitis, PVD who was brought to the emergency department via EMS after having a fall, hitting his right sided chest wall and head while trying to jump off the tailgate of his parked truck.  CT head there is acute hemorrhage layering dependently in the occipital horn of the left lateral ventricle.  Clinical Impression  Patient presents without issues mobilizing close to his balance and no deficits for mobility noted.  He plans on d/c home today with family.  No further skilled PT needs identified.     Follow Up Recommendations No PT follow up    Equipment Recommendations  None recommended by PT    Recommendations for Other Services       Precautions / Restrictions        Mobility  Bed Mobility Overal bed mobility: Modified Independent                Transfers Overall transfer level: Modified independent                  Ambulation/Gait Ambulation/Gait assistance: Independent Gait Distance (Feet): 200 Feet Assistive device: None Gait Pattern/deviations: Step-through pattern     General Gait Details: able to walk and look to sides and up and down without LOB, able to turn and stop no LOB and to step over simulated curb without LOB  Stairs Stairs: Yes Stairs assistance: Supervision Stair Management: Two rails;Alternating pattern;Forwards Number of Stairs: 4    Wheelchair Mobility    Modified Rankin (Stroke Patients Only)       Balance Overall balance assessment: Independent                                           Pertinent Vitals/Pain Pain  Assessment: No/denies pain    Home Living Family/patient expects to be discharged to:: Private residence Living Arrangements: Spouse/significant other Available Help at Discharge: Family;Available 24 hours/day Type of Home: House Home Access: Ramped entrance     Home Layout: One level Home Equipment: Walker - 2 wheels;Bedside commode;Shower seat;Wheelchair - manual      Prior Function Level of Independence: Independent               Hand Dominance        Extremity/Trunk Assessment   Upper Extremity Assessment Upper Extremity Assessment: Overall WFL for tasks assessed    Lower Extremity Assessment Lower Extremity Assessment: Overall WFL for tasks assessed       Communication   Communication: No difficulties  Cognition Arousal/Alertness: Awake/alert Behavior During Therapy: WFL for tasks assessed/performed Overall Cognitive Status: Within Functional Limits for tasks assessed                                        General Comments General comments (skin integrity, edema, etc.): completed parts of DGI without issues    Exercises     Assessment/Plan    PT Assessment Patent does not need any further PT services  PT Problem List  PT Treatment Interventions      PT Goals (Current goals can be found in the Care Plan section)  Acute Rehab PT Goals PT Goal Formulation: All assessment and education complete, DC therapy    Frequency     Barriers to discharge        Co-evaluation               AM-PAC PT "6 Clicks" Mobility  Outcome Measure Help needed turning from your back to your side while in a flat bed without using bedrails?: None Help needed moving from lying on your back to sitting on the side of a flat bed without using bedrails?: None Help needed moving to and from a bed to a chair (including a wheelchair)?: None Help needed standing up from a chair using your arms (e.g., wheelchair or bedside chair)?: None Help  needed to walk in hospital room?: None Help needed climbing 3-5 steps with a railing? : None 6 Click Score: 24    End of Session   Activity Tolerance: Patient tolerated treatment well Patient left: in bed;with family/visitor present   PT Visit Diagnosis: History of falling (Z91.81)    Time: 3343-5686 PT Time Calculation (min) (ACUTE ONLY): 18 min   Charges:   PT Evaluation $PT Eval Low Complexity: 1 Low          Magda Kiel, PT Acute Rehabilitation Services Pager:[phone] Office:[phone] 10/05/2019   [name] 10/05/2019, 3:05 PM

## 2019-10-05 NOTE — Progress Notes (Signed)
EEG complete - results pending 

## 2019-10-05 NOTE — Discharge Summary (Signed)
Physician Discharge Summary [facility]    Patient: Terry Munoz                   Admit date: 10/03/2019   DOB: 09-09-1950             Discharge date:10/05/2019/2:32 PM [id number]                          PCP: Aletha Halim., PA-C  Disposition: Home with home health  Recommendations for Outpatient Follow-up:   . Follow up: in 1 week  Discharge Condition: Stable   Code Status:   Code Status: Full Code  Diet recommendation: Regular healthy diet   Discharge Diagnoses:    Principal Problem:   Intracranial bleed ([place]) Active Problems:   Essential hypertension   Tobacco abuse   DM type 2 (diabetes mellitus, type 2) (HCC)   COPD (chronic obstructive pulmonary disease) (HCC)   Leukocytosis   Lung nodule   Hyperlipidemia   ICH (intracerebral hemorrhage) ([place])   Seizure ([place])   History of Present Illness/ Hospital Course Terry Munoz Summary:   69 year old male who presents to the emergency room after a fall.  CT imaging of the head indicated hemorrhage in occipital horn of the left lateral ventricle.  Case was reviewed with neurosurgery who did not feel that neurosurgical intervention was needed at this time.  On further questioning, patient's wife reported that the cause of his fall was that patient was having a seizure.  This is new onset seizures.  Case reviewed with neurology at [facility] who agreed with transfer evaluation including EEG and further imaging if needed.  Overall patient was transferred, with a continue neurochecks, no new focal neurological findings were identified.  EEG was negative.  Neurology has seen and evaluated patient thoroughly, did not recommend any further work-up or seizure medications as seizures worse post fall.  Patient is subsequently cleared by neurology and neurosurgery for discharge.  ------------------------------------------------------------------------------------------------------------------------------ 1. Intracranial  hemorrhage.  Noted on CT head that patient had hemorrhage in the occipital horn of the left lateral ventricle.  Plavix currently on hold.  Case was reviewed with neurosurgery who did not feel that surgical intervention was necessary at this time.  He continues to manifest no focal neurological findings or deficits.... Therefore no further imaging of the head was ordered.  2. Seizure.  Patient's wife describes that after he fell to the ground from his truck, patient began having jerking episodes of upper and lower extremities which lasted approximately 5 minutes.  No urinary incontinence, tongue biting.  Patient was somnolent/lethargic for approximately 30 minutes after event.  No similar episodes in the past.  Case was reviewed with Dr. Leonel Ramsay and it was recommended that patient... EEG was completed, no seizure activity was identified.  Neurology recommended no further work-up or recommendation      -The Keppra that was initiated was [place].     unlikely that his seizures related to alcohol withdrawal since he does not have any other signs of DTs and last drink was 24 hours before event.  3. History of alcohol use.  Patient's wife reports that patient drinks 2-3 beers and 5-6 mixed drinks every day.  His last drink was on Thursday afternoon.  No signs of withdrawal at this time.    While in hospital he did not require any medication for DTs no signs of DTs. 4. Hypertension.  Continue on irbesartan.  Blood pressure stable. 5.  Diabetes.  Continue Lantus.  Monitor on sliding scale insulin.  Blood sugar stable. 6. Hyperlipidemia.  Continue statin. 7. COPD with ongoing tobacco use.    No shortness of breath or wheezing at this time. 8. Left lower lobe lung nodule.  Repeat CT in 6 months as outpatient. Peripheral vascular disease.  Followed by Dr. Einar Gip.  Chronically on Plavix -which is now discontinued   Code Status: full code Family Communication: discussed with son and wife at the  bedside Extensive discussion was held with the patient's son, wife and patient at the bedside regarding his drinking habits.  Recurrent falls. He is advised to abstain from any alcohol use, to use any device to avoid any further falls. The patient and family is aware that any further deterioration of falls will be detrimental to his health specifically head bleed. He was asked regarding CODE STATUS-confirmed that he would like to be full code. Confirmed that his wife is his active healthcare power of attorney.      Disposition Plan: -To be discharged home, home PT OT has was ordered.     Discharge Instructions:   Discharge Instructions    Activity as tolerated - No restrictions   Complete by: As directed    Call MD for:   Complete by: As directed    Any change in mental status   Call MD for:  difficulty breathing, headache or visual disturbances   Complete by: As directed    Call MD for:  persistant dizziness or light-headedness   Complete by: As directed    Diet - low sodium heart healthy   Complete by: As directed    Discharge instructions   Complete by: As directed    Aggressive PT OT recommended, fall precautions at all times   Increase activity slowly   Complete by: As directed        Medication List    STOP taking these medications   clopidogrel 75 MG tablet Commonly known as: PLAVIX   HYDROcodone-acetaminophen 5-325 MG tablet Commonly known as: NORCO/VICODIN     TAKE these medications   albuterol 108 (90 Base) MCG/ACT inhaler Commonly known as: VENTOLIN HFA Inhale 1-2 puffs into the lungs every 6 (six) hours as needed for wheezing or shortness of breath.   albuterol (2.5 MG/3ML) 0.083% nebulizer solution Commonly known as: PROVENTIL Take 2.5 mg by nebulization every 6 (six) hours as needed for wheezing or shortness of breath.   amitriptyline 50 MG tablet Commonly known as: ELAVIL Take 50 mg by mouth at bedtime.   atorvastatin 40 MG tablet Commonly  known as: LIPITOR TAKE 1 TABLET DAILY   budesonide-formoterol 160-4.5 MCG/ACT inhaler Commonly known as: SYMBICORT Inhale 2 puffs into the lungs daily as needed (for shortness of breath).   folic acid 1 MG tablet Commonly known as: FOLVITE Take 1 tablet (1 mg total) by mouth daily for 5 days. Start taking on: October 06, 2019   Glyxambi 10-5 MG Tabs Generic drug: Empagliflozin-linaGLIPtin Take 1 tablet by mouth in the morning.   insulin glargine 100 UNIT/ML injection Commonly known as: LANTUS Inject 46 Units into the skin daily after breakfast.   insulin lispro 100 UNIT/ML injection Commonly known as: HUMALOG Inject 3 Units into the skin 3 (three) times daily after meals.   irbesartan 75 MG tablet Commonly known as: AVAPRO Take 1 tablet (75 mg total) by mouth daily.   multivitamin with minerals Tabs tablet Take 1 tablet by mouth daily for 5 days. Start taking on:  October 06, 2019   omeprazole 20 MG capsule Commonly known as: PRILOSEC Take 20 mg by mouth daily as needed (heartburn).   thiamine 50 MG tablet Commonly known as: VITAMIN B-1 Take 150 mg by mouth daily.       Allergies  Allergen Reactions  . Sulfa Antibiotics Hives     Procedures /Studies:   EEG  Result Date: 10/05/2019 Alexis Goodell, MD     10/05/2019 11:26 AM ELECTROENCEPHALOGRAM REPORT Patient: QUANTEZ SCHNYDER       Room #: 4Q03K EEG No. ID: [id number] Age: 69 y.o.        Sex: male Requesting Physician: Janeka Libman Report Date:  10/05/2019       Interpreting Physician: Alexis Goodell History: HERMILO DUTTER is an 69 y.o. male with seizure noted after fall Medications: Keppra, Dulera, Tradjenta, Avapro, Insulin, Jardiance, Elavil Conditions of Recording:  This is a 21 channel routine scalp EEG performed with bipolar and monopolar montages arranged in accordance to the international 10/20 system of electrode placement. One channel was dedicated to EKG recording. The patient is in the awake and drowsy states.  Description:  The waking background activity consists of a low voltage, symmetrical, fairly well organized, 8 Hz alpha activity, seen from the parieto-occipital and posterior temporal regions.  Low voltage fast activity, poorly organized, is seen anteriorly and is at times superimposed on more posterior regions.  A mixture of theta and alpha rhythms are seen from the central and temporal regions. The patient drowses with slowing to irregular, low voltage theta and beta activity.  Stage II sleep is not obtained. No epileptiform activity is noted.  Hyperventilation was not performed.  Intermittent photic stimulation was performed but failed to illicit any change in the tracing.  IMPRESSION: Normal electroencephalogram, awake, drowsy and with activation procedures. There are no focal lateralizing or epileptiform features. Alexis Goodell, MD Neurology [phone] 10/05/2019, 11:17 AM   CT HEAD WO CONTRAST  Addendum Date: 10/03/2019   ADDENDUM REPORT: 10/03/2019 18:02 ADDENDUM: Clinical team at [facility] emergency Department called to discuss this case. There is acute hemorrhage layering dependently in the occipital horn of the left lateral ventricle. I do not see any evidence of intraparenchymal hemorrhage or subarachnoid blood elsewhere. Electronically Signed   By: Nelson Chimes M.D.   On: 10/03/2019 18:02   Result Date: 10/03/2019 CLINICAL DATA:  Status post fall. EXAM: CT HEAD WITHOUT CONTRAST CT CERVICAL SPINE WITHOUT CONTRAST TECHNIQUE: Multidetector CT imaging of the head and cervical spine was performed following the standard protocol without intravenous contrast. Multiplanar CT image reconstructions of the cervical spine were also generated. COMPARISON:  None. FINDINGS: CT HEAD FINDINGS Brain: No evidence of acute infarction, hemorrhage, hydrocephalus, extra-axial collection or mass lesion/mass effect. There is chronic diffuse atrophy. Chronic bilateral periventricular white matter small vessel ischemic  changes are noted. Vascular: No hyperdense vessel is noted. Skull: Normal. Negative for fracture or focal lesion. Sinuses/Orbits: No acute finding. Other: None. CT CERVICAL SPINE FINDINGS Alignment: Normal. Skull base and vertebrae: No acute fracture. Patient status post anterior fusion of C3 through C7 without malalignment. Soft tissues and spinal canal: No prevertebral fluid or swelling. No visible canal hematoma. Disc levels:  Status post prior fusion of C3 through C7. Upper chest: Emphysematous changes of lung apices are noted. Other: None. IMPRESSION: 1. No focal acute intracranial abnormality identified. 2. Chronic diffuse atrophy and chronic bilateral periventricular white matter small vessel ischemic change. 3. No acute fracture or dislocation of cervical spine. 4. Status  post prior anterior fusion of C3 through C7 without malalignment. Electronically Signed: By: Abelardo Diesel M.D. On: 10/03/2019 14:56   CT CHEST W CONTRAST  Result Date: 10/03/2019 CLINICAL DATA:  Abdominal trauma. EXAM: CT CHEST, ABDOMEN, AND PELVIS WITH CONTRAST TECHNIQUE: Multidetector CT imaging of the chest, abdomen and pelvis was performed following the standard protocol during bolus administration of intravenous contrast. CONTRAST:  123mL OMNIPAQUE IOHEXOL 300 MG/ML  SOLN COMPARISON:  December 23, 2008. FINDINGS: CT CHEST FINDINGS Cardiovascular: Atherosclerosis of thoracic aorta is noted without aneurysm or dissection. Normal cardiac size. No pericardial effusion. Coronary artery calcifications are noted. Mediastinum/Nodes: No enlarged mediastinal, hilar, or axillary lymph nodes. Thyroid gland, trachea, and esophagus demonstrate no significant findings. Lungs/Pleura: No pneumothorax or pleural effusion is noted. Emphysematous disease is noted in both lungs. Calcified granuloma is noted in right lung base which is unchanged compared to prior exam. 7 mm subpleural nodule is noted posteriorly in the left lower lobe best seen on  image number 106 of series 4. Musculoskeletal: No chest wall mass or suspicious bone lesions identified. CT ABDOMEN PELVIS FINDINGS Hepatobiliary: No focal liver abnormality is seen. No gallstones, gallbladder wall thickening, or biliary dilatation. Pancreas: Unremarkable. No pancreatic ductal dilatation or surrounding inflammatory changes. Spleen: Calcified splenic granulomata are noted. No acute abnormality is seen. Adrenals/Urinary Tract: Adrenal glands are unremarkable. Kidneys are normal, without renal calculi, focal lesion, or hydronephrosis. Bladder is unremarkable. Stomach/Bowel: Stomach is within normal limits. Appendix appears normal. No evidence of bowel wall thickening, distention, or inflammatory changes. Vascular/Lymphatic: Aortic atherosclerosis. No enlarged abdominal or pelvic lymph nodes. Reproductive: Prostate is unremarkable. Other: No abdominal wall hernia or abnormality. No abdominopelvic ascites. Musculoskeletal: No acute or significant osseous findings. IMPRESSION: 1. Coronary artery calcifications are noted suggesting coronary artery disease. 2. 7 mm subpleural nodule is noted posteriorly in the left lower lobe. Non-contrast chest CT at 6-12 months is recommended. If the nodule is stable at time of repeat CT, then future CT at 18-24 months (from today's scan) is considered optional for low-risk patients, but is recommended for high-risk patients. This recommendation follows the consensus statement: Guidelines for Management of Incidental Pulmonary Nodules Detected on CT Images: From the Fleischner Society 2017; Radiology 2017; 284:228-243. 3. No other abnormality seen in the chest, abdomen or pelvis. Aortic Atherosclerosis (ICD10-I70.0) and Emphysema (ICD10-J43.9). Electronically Signed   By: Marijo Conception M.D.   On: 10/03/2019 15:09   CT CERVICAL SPINE WO CONTRAST  Addendum Date: 10/03/2019   ADDENDUM REPORT: 10/03/2019 18:02 ADDENDUM: Clinical team at the [facility] emergency  Department called to discuss this case. There is acute hemorrhage layering dependently in the occipital horn of the left lateral ventricle. I do not see any evidence of intraparenchymal hemorrhage or subarachnoid blood elsewhere. Electronically Signed   By: Nelson Chimes M.D.   On: 10/03/2019 18:02   Result Date: 10/03/2019 CLINICAL DATA:  Status post fall. EXAM: CT HEAD WITHOUT CONTRAST CT CERVICAL SPINE WITHOUT CONTRAST TECHNIQUE: Multidetector CT imaging of the head and cervical spine was performed following the standard protocol without intravenous contrast. Multiplanar CT image reconstructions of the cervical spine were also generated. COMPARISON:  None. FINDINGS: CT HEAD FINDINGS Brain: No evidence of acute infarction, hemorrhage, hydrocephalus, extra-axial collection or mass lesion/mass effect. There is chronic diffuse atrophy. Chronic bilateral periventricular white matter small vessel ischemic changes are noted. Vascular: No hyperdense vessel is noted. Skull: Normal. Negative for fracture or focal lesion. Sinuses/Orbits: No acute finding. Other: None. CT CERVICAL SPINE FINDINGS  Alignment: Normal. Skull base and vertebrae: No acute fracture. Patient status post anterior fusion of C3 through C7 without malalignment. Soft tissues and spinal canal: No prevertebral fluid or swelling. No visible canal hematoma. Disc levels:  Status post prior fusion of C3 through C7. Upper chest: Emphysematous changes of lung apices are noted. Other: None. IMPRESSION: 1. No focal acute intracranial abnormality identified. 2. Chronic diffuse atrophy and chronic bilateral periventricular white matter small vessel ischemic change. 3. No acute fracture or dislocation of cervical spine. 4. Status post prior anterior fusion of C3 through C7 without malalignment. Electronically Signed: By: Abelardo Diesel M.D. On: 10/03/2019 14:56   CT ABDOMEN PELVIS W CONTRAST  Result Date: 10/03/2019 CLINICAL DATA:  Abdominal trauma. EXAM: CT CHEST,  ABDOMEN, AND PELVIS WITH CONTRAST TECHNIQUE: Multidetector CT imaging of the chest, abdomen and pelvis was performed following the standard protocol during bolus administration of intravenous contrast. CONTRAST:  150mL OMNIPAQUE IOHEXOL 300 MG/ML  SOLN COMPARISON:  December 23, 2008. FINDINGS: CT CHEST FINDINGS Cardiovascular: Atherosclerosis of thoracic aorta is noted without aneurysm or dissection. Normal cardiac size. No pericardial effusion. Coronary artery calcifications are noted. Mediastinum/Nodes: No enlarged mediastinal, hilar, or axillary lymph nodes. Thyroid gland, trachea, and esophagus demonstrate no significant findings. Lungs/Pleura: No pneumothorax or pleural effusion is noted. Emphysematous disease is noted in both lungs. Calcified granuloma is noted in right lung base which is unchanged compared to prior exam. 7 mm subpleural nodule is noted posteriorly in the left lower lobe best seen on image number 106 of series 4. Musculoskeletal: No chest wall mass or suspicious bone lesions identified. CT ABDOMEN PELVIS FINDINGS Hepatobiliary: No focal liver abnormality is seen. No gallstones, gallbladder wall thickening, or biliary dilatation. Pancreas: Unremarkable. No pancreatic ductal dilatation or surrounding inflammatory changes. Spleen: Calcified splenic granulomata are noted. No acute abnormality is seen. Adrenals/Urinary Tract: Adrenal glands are unremarkable. Kidneys are normal, without renal calculi, focal lesion, or hydronephrosis. Bladder is unremarkable. Stomach/Bowel: Stomach is within normal limits. Appendix appears normal. No evidence of bowel wall thickening, distention, or inflammatory changes. Vascular/Lymphatic: Aortic atherosclerosis. No enlarged abdominal or pelvic lymph nodes. Reproductive: Prostate is unremarkable. Other: No abdominal wall hernia or abnormality. No abdominopelvic ascites. Musculoskeletal: No acute or significant osseous findings. IMPRESSION: 1. Coronary artery  calcifications are noted suggesting coronary artery disease. 2. 7 mm subpleural nodule is noted posteriorly in the left lower lobe. Non-contrast chest CT at 6-12 months is recommended. If the nodule is stable at time of repeat CT, then future CT at 18-24 months (from today's scan) is considered optional for low-risk patients, but is recommended for high-risk patients. This recommendation follows the consensus statement: Guidelines for Management of Incidental Pulmonary Nodules Detected on CT Images: From the Fleischner Society 2017; Radiology 2017; 284:228-243. 3. No other abnormality seen in the chest, abdomen or pelvis. Aortic Atherosclerosis (ICD10-I70.0) and Emphysema (ICD10-J43.9). Electronically Signed   By: Marijo Conception M.D.   On: 10/03/2019 15:09   DG Pelvis Portable  Result Date: 10/03/2019 CLINICAL DATA:  Status post fall this morning. EXAM: PORTABLE PELVIS 1-2 VIEWS COMPARISON:  None. FINDINGS: There is no evidence of pelvic fracture or diastasis. Mild degenerative joint changes of bilateral hips with narrowed joint space and osteophyte formation are noted. IMPRESSION: No acute fracture or dislocation. Electronically Signed   By: Abelardo Diesel M.D.   On: 10/03/2019 13:15   DG Chest Port 1 View  Result Date: 10/03/2019 CLINICAL DATA:  Right chest pain EXAM: PORTABLE CHEST 1 VIEW COMPARISON:  03/18/2018 FINDINGS: The lungs are well expanded and are symmetric. There has developed bibasilar reticulonodular infiltrate, a finding can be seen with atypical infection or aspiration. No pneumothorax or pleural effusion. Cardiac size within normal limits. Pulmonary vascularity normal. No acute bone abnormality. Cervical fusion hardware again noted. IMPRESSION: Interval development of bibasilar reticulonodular infiltrate suspicious for atypical infection. Electronically Signed   By: Fidela Salisbury MD   On: 10/03/2019 13:16     Subjective:   Patient was seen and examined 10/05/2019, 2:32 PM Patient  stable today. No acute distress.  No issues overnight Stable for discharge.  Discharge Exam:    Vitals:   10/05/19 0030 10/05/19 0329 10/05/19 0943 10/05/19 1404  BP: 129/86 (!) 141/80 (!) 130/99 138/75  Pulse: 90 89 94 78  Resp: 16 17 18 18   Temp: 98 F (36.7 C) 98.5 F (36.9 C) (!) 97.5 F (36.4 C) 97.9 F (36.6 C)  TempSrc: Oral Oral Oral Oral  SpO2: 93% 93% 95% 91%  Weight:      Height:        General: Pt lying comfortably in bed & appears in no obvious distress. Cardiovascular: S1 & S2 heard, RRR, S1/S2 +. No murmurs, rubs, gallops or clicks. No JVD or pedal edema. Respiratory: Clear to auscultation without wheezing, rhonchi or crackles. No increased work of breathing. Abdominal:  Non-distended, non-tender & soft. No organomegaly or masses appreciated. Normal bowel sounds heard. CNS: Alert and oriented. No focal deficits. Extremities: no edema, no cyanosis    The results of significant diagnostics from this hospitalization (including imaging, microbiology, ancillary and laboratory) are listed below for reference.      Microbiology:   Recent Results (from the past 240 hour(s))  SARS Coronavirus 2 by RT PCR (hospital order, performed in [facility] hospital lab) Nasopharyngeal Nasopharyngeal Swab     Status: None   Collection Time: 10/03/19  8:30 PM   Specimen: Nasopharyngeal Swab  Result Value Ref Range Status   SARS Coronavirus 2 NEGATIVE NEGATIVE Final    Comment: (NOTE) SARS-CoV-2 target nucleic acids are NOT DETECTED.  The SARS-CoV-2 RNA is generally detectable in upper and lower respiratory specimens during the acute phase of infection. The lowest concentration of SARS-CoV-2 viral copies this assay can detect is 250 copies / mL. A negative result does not preclude SARS-CoV-2 infection and should not be used as the sole basis for treatment or other patient management decisions.  A negative result may occur with improper specimen collection / handling,  submission of specimen other than nasopharyngeal swab, presence of viral mutation(s) within the areas targeted by this assay, and inadequate number of viral copies (<250 copies / mL). A negative result must be combined with clinical observations, patient history, and epidemiological information.  Fact Sheet for Patients:   StrictlyIdeas.no  Fact Sheet for Healthcare Providers: BankingDealers.co.za  This test is not yet approved or  cleared by the [place] FDA and has been authorized for detection and/or diagnosis of SARS-CoV-2 by FDA under an Emergency Use Authorization (EUA).  This EUA will remain in effect (meaning this test can be used) for the duration of the COVID-19 declaration under Section 564(b)(1) of the Act, 21 U.S.C. section 360bbb-3(b)(1), unless the authorization is terminated or revoked sooner.  Performed at [facility], [address], [place], [place] [address]      Labs:   CBC: Recent Labs  Lab 10/03/19 1309 10/03/19 1318 10/04/19 0656 10/04/19 1329  WBC 12.5*  --  9.4 7.2  HGB  16.5 16.3 15.6 15.3  HCT 48.6 48.0 46.9 45.3  MCV 100.6*  --  100.9* 101.1*  PLT 185  --  169 801   Basic Metabolic Panel: Recent Labs  Lab 10/03/19 1309 10/03/19 1318 10/04/19 0656 10/04/19 1329  NA 137 138 137 136  K 3.9 4.1 3.9 3.7  CL 98 97* 98 98  CO2 27  --  27 29  GLUCOSE 81 82 113* 187*  BUN 13 12 15 15   CREATININE 1.24 1.30* 1.17 1.10  CALCIUM 9.1  --  9.0 8.7*  MG  --   --   --  1.9  PHOS  --   --   --  3.2   Liver Function Tests: Recent Labs  Lab 10/03/19 1309 10/04/19 1329  AST 40 26  ALT 36 24  ALKPHOS 78 74  BILITOT 0.9 0.9  PROT 7.3 7.0  ALBUMIN 3.9 3.6   BNP (last 3 results) No results for input(s): BNP in the last 8760 hours. Cardiac Enzymes: No results for input(s): CKTOTAL, CKMB, CKMBINDEX, TROPONINI in the last 168 hours. CBG: Recent Labs  Lab 10/04/19 2150 10/04/19 2221  10/05/19 0617 10/05/19 0718 10/05/19 1142  GLUCAP 64* 81 123* 116* 155*   Hgb A1c Recent Labs    10/04/19 0656  HGBA1C 7.3*   Lipid Profile No results for input(s): CHOL, HDL, LDLCALC, TRIG, CHOLHDL, LDLDIRECT in the last 72 hours. Thyroid function studies No results for input(s): TSH, T4TOTAL, T3FREE, THYROIDAB in the last 72 hours.  Invalid input(s): FREET3 Anemia work up No results for input(s): VITAMINB12, FOLATE, FERRITIN, TIBC, IRON, RETICCTPCT in the last 72 hours. Urinalysis    Component Value Date/Time   COLORURINE YELLOW 09/02/2015 1931   APPEARANCEUR CLEAR 09/02/2015 1931   LABSPEC 1.030 09/02/2015 1931   PHURINE 5.5 09/02/2015 1931   GLUCOSEU >1000 (A) 09/02/2015 1931   HGBUR NEGATIVE 09/02/2015 1931   BILIRUBINUR NEGATIVE 09/02/2015 1931   KETONESUR 15 (A) 09/02/2015 1931   PROTEINUR NEGATIVE 09/02/2015 1931   UROBILINOGEN 1.0 04/20/2014 1604   NITRITE NEGATIVE 09/02/2015 1931   LEUKOCYTESUR NEGATIVE 09/02/2015 1931         Time coordinating discharge: Over 46 minutes   SIGNED: Deatra James, MD, FACP, FHM. [facility],  Please use [email or web address] to Page If 7PM-7AM, please contact night-coverage [email or web address] Ota [facility] 10/05/2019, 2:32 PM

## 2019-10-05 NOTE — TOC Transition Note (Signed)
Transition of Care [facility]) - CM/SW Discharge Note   Patient Details  Name: STAVROS CAIL MRN: [id number] Date of Birth: 1950-08-30  Transition of Care [facility]) CM/SW Contact:  Carles Collet, RN Phone Number: 10/05/2019, 3:27 PM   Clinical Narrative:    Damaris Schooner w patient and wife, agreeable to [facility] services, chose [facility], accepted, patient has all needed DME including RW at home.     Final next level of care: Home w Home Health Services Barriers to Discharge: No Barriers Identified   Patient Goals and CMS Choice Patient states their goals for this hospitalization and ongoing recovery are:: Return home      Discharge Placement                       Discharge Plan and Services                          HH Arranged: PT, OT [place] Agency: [place] ([facility]) Date HH Agency Contacted: 10/05/19 Time Secretary: [id number] Representative spoke with at [place]: [place] ([facility]) Interventions     Readmission Risk Interventions No flowsheet data found.

## 2019-10-06 ENCOUNTER — Telehealth: Payer: Self-pay

## 2019-10-06 LAB — GLUCOSE, CAPILLARY: Glucose-Capillary: 176 mg/dL — ABNORMAL HIGH (ref 70–99)

## 2019-10-06 NOTE — Telephone Encounter (Signed)
I am okay with this

## 2019-10-06 NOTE — Telephone Encounter (Signed)
pt's wife called. The pt was seen in ER ans had a brain bleed due to a fall. They d/c Plavix. She wanted to make sure you were ok with this. Please advise.

## 2019-10-30 ENCOUNTER — Ambulatory Visit (INDEPENDENT_AMBULATORY_CARE_PROVIDER_SITE_OTHER): Payer: Medicare Other | Admitting: Neurology

## 2019-10-30 ENCOUNTER — Encounter: Payer: Self-pay | Admitting: Neurology

## 2019-10-30 VITALS — BP 148/92 | HR 100 | Ht 68.0 in | Wt 157.0 lb

## 2019-10-30 DIAGNOSIS — S06351S Traumatic hemorrhage of left cerebrum with loss of consciousness of 30 minutes or less, sequela: Secondary | ICD-10-CM | POA: Diagnosis not present

## 2019-10-30 NOTE — Progress Notes (Signed)
Provider:  Larey Seat, MD  Primary Care Physician:  Aletha Halim., PA-C [address] [place] [address]     Referring Provider:         Chief Complaint according to patient   Patient presents with:    . New Patient (Initial Visit)           HISTORY OF PRESENT ILLNESS:  NAITHAN DELAGE is a 69 y.o. year old White or Caucasian male patient seen here as a referral on 10/30/2019 from dr Vertell Limber and Bing Matter, NP,  for a neurological evaluation .  Chief concern according to patient : Mr. Dockendorf is seen here today following a hospitalization from 03 October 2019 he actually initially was admitted to [facility] in [place] after he fell from his truck.  Mrs. Mcginness remembers that they were cleaning up the backyard and that her husband fell from the pickup truck forwards hitting the right side of his body and also the right rib cage and torso but he must have had hit his head as well.  He was not immediately responsive, on the contrary he became unresponsive and seemed to have seized he was rigid and twitching.  She could not get him to respond and as she did not have her mobile phone with her she had to go into the house by the time she found her phone and came back out her husband tried by himself to get up again but he was still very confused.  This may have taken a couple or even several minutes.  He was then presenting to the hospital at any [facility] where he stayed for 2 days from where he was transferred to [facility].  His cardiologist is Dr. Christen Butter and at the hospital he was evaluated by Dr. Erline Levine neurosurgeon.  There is an acute hemorrhage that was identified layering in the occipital horn of the left lateral ventricle this was called to the neurosurgical team, this finding calls to transfer to [facility].  A CT of the chest was also done abdominal trauma as the cause there were multiple coronary artery calcifications noted,  coronary artery disease was known as well as aortic atherosclerosis and progressed emphysema.  There was no rib fracture noted and he did not have a pneumothorax.  The patient also was noted to have chronic diffuse brain atrophy, he did not have cervical spine injuries no misalignment.  He is status post prior anterior fusion of C3-C7 -a 4 level fusion of the neck spine, Dr Carloyn Manner.  Emphysema was noticed here iin the upper lung apices .    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 post cervical spine fusion.      Family medical history: mother with Alzheimer's.   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  She is a English as a second language teacher , Chief Technology Officer first class, 25 years.  He lives in a household with spouse.   4 adult sons- many grandchildren.  Tobacco use: 1 1/2 PPD.  ETOH use; regularly  Caffeine intake in form of Coffee( all morning *) beer in PM and mixed drinks at night !     Review of Systems: Out of a complete 14 system review, the patient complains of only the following symptoms, and all other reviewed systems are negative.:  Back to baseline- according to wife and patient.     Social History   Socioeconomic History  . Marital status: Married    Spouse name: Not on file  . Number of children: 1  . Years of education: Not on file  . Highest education level: Not on file  Occupational History  . Not on file  Tobacco Use  . Smoking status: Current Every Day Smoker    Packs/day: 2.00     Years: 45.00    Pack years: 90.00    Types: Cigarettes  . Smokeless tobacco: Never Used  Vaping Use  . Vaping Use: Some days  Substance and Sexual Activity  . Alcohol use: Yes    Alcohol/week: 24.0 standard drinks    Types: 24 Cans of beer per week    Comment: daily  . Drug use: No  . Sexual activity: Not on file  Other Topics Concern  . Not on file  Social History Narrative  . Not on file   Social Determinants of Health   Financial Resource Strain:   . Difficulty of Paying Living Expenses:   Food Insecurity:   . Worried About Charity fundraiser in the Last Year:   . Arboriculturist in the Last Year:   Transportation Needs:   . Film/video editor (Medical):   Marland Kitchen Lack of Transportation (Non-Medical):   Physical Activity:   . Days of Exercise per Week:   . Minutes of Exercise per Session:   Stress:   . Feeling of Stress :   Social Connections:   . Frequency of Communication with Friends and Family:   . Frequency of Social Gatherings with Friends and Family:   . Attends Religious Services:   . Active Member of Clubs or Organizations:   . Attends Archivist Meetings:   Marland Kitchen Marital Status:     Family History  Problem Relation Age of Onset  . Heart disease Mother   . Hypertension Mother   . Hypertension Father   . Hypertension Sister   . Hypertension Brother   . Diabetes Brother   . Hypertension Sister     Past Medical History:  Diagnosis Date  . COPD (chronic obstructive pulmonary disease) ([place])   . Diabetes mellitus without complication ([place])    INSULIN DEPENDENT  . Hyperlipidemia 10/04/2019  . Hypertension   . Hyponatremia 03/2014  . Insomnia   . Kidney stones   . Mouth ulcers   . Osteoarthritis   . Otitis media   . Peripheral vascular disease ([place])   . Sinusitis     Past Surgical History:  Procedure Laterality Date  . DENTAL SURGERY    . FEMORAL-POPLITEAL BYPASS GRAFT Right 07/24/2015   Procedure: Thrombectomy Right Femoral-Popliteal  Artery; Thrombectomy Right Anterior-Tibial Artery, Thrombectomy Right Tibial-Peroneal Trunk; Bovine Patch Angioplasty Right Popliteal Artery; Endarterectomy Right Anterior Tibial Artery and Right Tibial-Peroneal Trunk; Right Leg Angiogram, Four Compartment Fasciotomy Right Lower Leg;  Surgeon: Conrad Omaha, MD;  Location: [place];  Service: Vascular;  L  . HERNIA REPAIR    . lithotomy    . LOWER EXTREMITY ANGIOGRAM N/A 05/01/2014   Procedure:  LOWER EXTREMITY ANGIOGRAM;  Surgeon: Laverda Page, MD;  Location: [facility] CATH LAB;  Service: Cardiovascular;  Laterality: N/A;  . NECK SURGERY    . POPLITEAL ARTERY ANGIOPLASTY Right 05/02/2014   to mid sfa  . THROMBECTOMY  05/02/2014     Current Outpatient Medications on File Prior to Visit  Medication Sig Dispense Refill  . albuterol (PROVENTIL HFA;VENTOLIN HFA) 108 (90 Base) MCG/ACT inhaler Inhale 1-2 puffs into the lungs every 6 (six) hours as needed for wheezing or shortness of breath.    Marland Kitchen albuterol (PROVENTIL) (2.5 MG/3ML) 0.083% nebulizer solution Take 2.5 mg by nebulization every 6 (six) hours as needed for wheezing or shortness of breath.     Marland Kitchen amitriptyline (ELAVIL) 50 MG tablet Take 50 mg by mouth at bedtime.    Marland Kitchen atorvastatin (LIPITOR) 40 MG tablet TAKE 1 TABLET DAILY (Patient taking differently: Take 40 mg by mouth daily. ) 90 tablet 3  . budesonide-formoterol (SYMBICORT) 160-4.5 MCG/ACT inhaler Inhale 2 puffs into the lungs daily as needed (for shortness of breath).     . GLYXAMBI 10-5 MG TABS Take 1 tablet by mouth in the morning.     . insulin glargine (LANTUS) 100 UNIT/ML injection Inject 46 Units into the skin daily after breakfast.     . insulin lispro (HUMALOG) 100 UNIT/ML injection Inject 3 Units into the skin 3 (three) times daily after meals.    . irbesartan (AVAPRO) 75 MG tablet Take 1 tablet (75 mg total) by mouth daily. 30 tablet 0  . omeprazole (PRILOSEC) 20 MG capsule Take 20 mg by mouth daily as needed (heartburn).    . thiamine  (VITAMIN B-1) 50 MG tablet Take 150 mg by mouth daily.     No current facility-administered medications on file prior to visit.    Allergies  Allergen Reactions  . Sulfa Antibiotics Hives    Physical exam:  Today's Vitals   10/30/19 1353  BP: (!) 148/92  Pulse: 100  Weight: 157 lb (71.2 kg)  Height: 5\' 8"  (1.727 m)   Body mass index is 23.87 kg/m.   Wt Readings from Last 3 Encounters:  10/30/19 157 lb (71.2 kg)  10/04/19 158 lb 4.6 oz (71.8 kg)  10/03/19 161 lb (73 kg)     Ht Readings from Last 3 Encounters:  10/30/19 5\' 8"  (1.727 m)  10/04/19 5\' 8"  (1.727 m)  10/03/19 5\' 8"  (1.727 m)      General: The patient is awake, alert and appears not in acute distress. The patient is poorly groomed.  Head: Normocephalic, atraumatic. Neck is supple. Mallampati 2  neck circumference:17 inches . Nasal airflow patent.  Cardiovascular:  Regular rate and cardiac rhythm by pulse. Respiratory: Lungs are clear to auscultation.  Skin:  Without evidence of ankle edema, or rash. Trunk: The patient's posture is stooped.    Neurologic exam : The patient is awake and alert, oriented to place and time.   Memory subjective described as intact.  Attention span & concentration ability appears normal.  Montreal Cognitive Assessment  10/30/2019  Visuospatial/ Executive (0/5) 1  Naming (0/3) 3  Attention: Read list of digits (0/2) 2  Attention: Read list of letters (0/1) 1  Attention: Serial 7 subtraction starting at 100 (0/3) 2  Language: Repeat phrase (0/2) 2  Language : Fluency (0/1) 1  Abstraction (0/2) 2  Delayed Recall (0/5) 1  Orientation (0/6) 6  Total 21    Speech is fluent,  without dysarthria, dysphonia or aphasia.  Mood  and affect are appropriate.   Cranial nerves: no loss of smell or taste reported  Pupils are small, the right pupil appears smaller the the left, both a round, minimal reactive to light. Funduscopic exam - no palor.   Extraocular movements in vertical and  horizontal planes were intact but there was no accomodative movement-without nystagmus. No Diplopia. Visual fields by finger perimetry are intact. Hearing was intact to soft voice and finger rubbing.    Facial sensation intact to fine touch.  Facial motor strength is symmetric and tongue and uvula move midline.  Neck ROM : rotation, tilt and flexion extension were normal for age and shoulder shrug was symmetrical.    Motor exam:  Symmetric bulk, tone and ROM.  There was mild elevated tone over the injured right shoulder and upper arm.   Only on right biceps mild  cog wheeling, symmetric grip strength .   Sensory:  Fine touch, pinprick and vibration werenormal.  Proprioception tested in the upper extremities was normal.   Coordination: Rapid alternating movements in the fingers/hands were of normal speed.  The Finger-to-nose maneuver was abnormal - with evidence of ataxia, dysmetria but no tremor.   Gait and station: Patient could rise unassisted from a seated position, walked without assistive device.  He has presented with normal arm swing. He appears stooped.  Stance is of normal width/ base and the patient turned with 4 steps.  Toe and heel walk were deferred.  Deep tendon reflexes: in the  upper and lower extremities are symmetric and intact.  Babinski response was normal        After spending a total time of  45 minutes face to face and additional time for physical and neurologic examination, review of laboratory studies,  personal review of imaging studies, reports and results of other testing and review of referral information / records as far as provided in visit, I have established the following assessments:  1)  The patient has subjectively returned to baseline and his wife confirms this.  2)  TBI- with a intracranial hemorrhage , a single seizure and loss of awareness for up to 30 minutes total. No vomiting and      no headaches reported !!!.  He needed no surgical attention-  Plavix was discontinued.  3) I agree with Dr Vertell Limber assessment , traumatic brain bleed in good recovery. If his memory is back to baseline , no reason to perform more testing now- MOCA of 21/30 points is not unusual for non college educated patient. .    My Plan is to proceed with:  1) no need for CT or EEG repeat.  2) consider MMSE in primary care environment if memory declines from here.    I would like to thank  Aletha Halim., Pa-c [address]  [place] [address] for allowing me to meet with and to take care of this pleasant patient.   In short, DAVIDSON PALMIERI is presenting with a TBI and has recovered from his deficits. I strongly urged hm to not use alcohol for a period of 6 month to help heal his brain further and prevent further seizure activity.   No driving for 6 month post brain bleed with seizure in [place]. Patient has acknowledged that his hospitalitis have also asked him not to drive .   CC: I will share my notes with PCP.  Electronically signed by: Larey Seat, MD 10/30/2019 2:17 PM  [facility] and [employer] Board certified  by The American Board of Sleep Medicine and Diplomate of the [employer] of Sleep Medicine. Board certified In Neurology through the [place], Fellow of the [employer] of Neurology. Medical Director of [employer].

## 2019-10-30 NOTE — Patient Instructions (Signed)
No driving for 6 month post brain bleed with seizure in [place].    Traumatic Brain Injury Traumatic brain injury (TBI) is an injury to the brain that results from:  A hard, direct blow to the head (closed injury).  An object penetrating the skull and entering the brain (open injury). Traumatic brain injury is also called a head injury or a concussion. TBI can be mild, moderate, or severe. What are the causes? Common causes of this condition include:  Falls.  Motor vehicle accidents.  Sports injuries.  Assaults. What increases the risk? You are more likely to develop this condition if you:  Are 70 years old or older.  Are a man.  Play contact sports, especially football, hockey, or soccer.  Are in the [employer].  Are a victim of violence.  Abuse drugs or alcohol.  Have had a previous TBI. What are the signs or symptoms? Symptoms may vary from person to person, and may include:  Loss of consciousness.  Headache.  Confusion.  Fatigue.  Changes in sleep.  Dizziness.  Mood or personality changes.  Memory problems.  Nausea or vomiting or both.  Seizures.  Clumsiness.  Slurred speech.  Depression and anxiety.  Anger.  Trouble concentrating, organizing, or making decisions.  Inability to control emotions or actions (impulse control).  Loss of or dulling of the senses, such as hearing, vision, and touch. This can include: ? Blurred vision. ? Ringing in your ears. How is this diagnosed? This condition may be diagnosed based on:  Medical history and physical exam.  Neurologic exam. This checks for brain and nervous system function, including your reflexes, memory, and coordination.  CT scan. Your TBI may be described as mild, moderate, or severe. How is this treated? Treatment depends on the severity of your brain injury and may include:  Breathing support (mechanical ventilation).  Blood pressure medicines.  Pain medicines.  Treatments to  decrease the swelling in your brain.  Brain surgery. This may be needed to: ? Remove a blood clot. ? Repair bleeding. ? Remove an object that has penetrated the brain, such as a skull fragment or a bullet. Treatment of TBI also includes:  Physical and mental rest.  Careful observation.  Medicine. You may be prescribed medicines to help with symptoms such as headaches, nausea, or difficulty sleeping.  Physical, occupational, and speech therapy.  Referral to a concussion clinic or rehabilitation center. Follow these instructions at home: Medicines  Take over-the-counter and prescription medicines only as told by your health care provider.  Do not take blood thinners (anticoagulants), aspirin, or other anti-inflammatory medicines such as ibuprofen or naproxen unless approved by your health care provider. Activity  Rest. Rest helps the brain to heal. Make sure you: ? Get plenty of sleep. Most adults should get 7-9 hours of sleep each night. ? Rest during the day. Take daytime naps or rest breaks when you feel tired.  Do not do high-risk activities that could cause a second concussion, such as riding a bike or playing sports. Having another concussion before the first one has healed can be dangerous.  Avoid a lot of visual stimulation. This includes work on the computer or phone, watching TV, and reading.  Ask your health care provider what kind of activities are safe for you. Your ability to react may be slower after a brain injury. Never do these activities if you are dizzy. Your health care provider will likely give you a plan for gradually returning to activities. General instructions  Do not drink alcohol.  Watch your symptoms and tell others to do the same. Complications sometimes occur after a brain injury. Older adults with a brain injury may have a higher risk of serious complications.  Seek support from friends and family.  Keep all follow-up visits as directed by your  health care provider. This is important. Contact a health care provider if:  Your symptoms get worse or they do not improve.  You have new symptoms.  You have another injury. Get help right away if:  You have: ? Severe, persistent headaches that are not relieved by medicine. ? Weakness or numbness in any part of your body. ? Confusion. ? Slurred speech. ? Difficulty waking up. ? Nausea or persistent vomiting. ? A feeling like you are moving when you are not (vertigo). ? Seizures or you faint. ? Changes in your vision. ? Clear or bloody discharge from your nose or ears.  You cannot use your arms or legs normally. Summary  Traumatic brain injury happens when there is a hard, direct blow to the head or when an object penetrates the skull and enters the brain.  Traumatic brain injuries may be mild, moderate, or severe. Treatment depends on the severity of your injury.  Get help right away if you have a head injury and you develop seizures, confusion, vomiting, weakness in the arms or legs, slurred speech, and other symptoms.  Rest is one of the best treatments. Do not return to activity until your health care provider approves. This information is not intended to replace advice given to you by your health care provider. Make sure you discuss any questions you have with your health care provider. Document Revised: 07/18/2017 Document Reviewed: 04/30/2017 Elsevier Patient Education  [place].

## 2020-02-12 DIAGNOSIS — D0439 Carcinoma in situ of skin of other parts of face: Secondary | ICD-10-CM | POA: Diagnosis not present

## 2020-02-12 DIAGNOSIS — C4442 Squamous cell carcinoma of skin of scalp and neck: Secondary | ICD-10-CM | POA: Diagnosis not present

## 2020-02-25 ENCOUNTER — Other Ambulatory Visit: Payer: Self-pay | Admitting: Physician Assistant

## 2020-02-25 ENCOUNTER — Ambulatory Visit
Admission: RE | Admit: 2020-02-25 | Discharge: 2020-02-25 | Disposition: A | Discharge status: Home / Self Care | Payer: Medicare Other | Source: Ambulatory Visit | Attending: Physician Assistant | Admitting: Physician Assistant

## 2020-02-25 DIAGNOSIS — R7989 Other specified abnormal findings of blood chemistry: Secondary | ICD-10-CM

## 2020-02-25 DIAGNOSIS — R0789 Other chest pain: Secondary | ICD-10-CM

## 2020-02-25 DIAGNOSIS — R0602 Shortness of breath: Secondary | ICD-10-CM

## 2020-02-25 MED ORDER — IOPAMIDOL (ISOVUE-370) INJECTION 76%
75.0000 mL | Freq: Once | INTRAVENOUS | Status: AC | PRN
  Administered 2020-02-25: 75 mL via INTRAVENOUS

## 2020-03-19 ENCOUNTER — Emergency Department (HOSPITAL_COMMUNITY): Payer: Medicare Other

## 2020-03-19 ENCOUNTER — Other Ambulatory Visit: Payer: Self-pay

## 2020-03-19 ENCOUNTER — Inpatient Hospital Stay (HOSPITAL_COMMUNITY)
Admission: EM | Admit: 2020-03-19 | Discharge: 2020-03-21 | DRG: 896 | Disposition: A | Discharge status: Home / Self Care | Payer: Medicare Other | Attending: Internal Medicine | Admitting: Internal Medicine

## 2020-03-19 DIAGNOSIS — R338 Other retention of urine: Secondary | ICD-10-CM | POA: Diagnosis present

## 2020-03-19 DIAGNOSIS — Z882 Allergy status to sulfonamides status: Secondary | ICD-10-CM

## 2020-03-19 DIAGNOSIS — R4182 Altered mental status, unspecified: Secondary | ICD-10-CM

## 2020-03-19 DIAGNOSIS — D6959 Other secondary thrombocytopenia: Secondary | ICD-10-CM | POA: Diagnosis present

## 2020-03-19 DIAGNOSIS — I2699 Other pulmonary embolism without acute cor pulmonale: Secondary | ICD-10-CM | POA: Diagnosis present

## 2020-03-19 DIAGNOSIS — G9341 Metabolic encephalopathy: Secondary | ICD-10-CM | POA: Diagnosis present

## 2020-03-19 DIAGNOSIS — Z8249 Family history of ischemic heart disease and other diseases of the circulatory system: Secondary | ICD-10-CM

## 2020-03-19 DIAGNOSIS — I10 Essential (primary) hypertension: Secondary | ICD-10-CM | POA: Diagnosis present

## 2020-03-19 DIAGNOSIS — J449 Chronic obstructive pulmonary disease, unspecified: Secondary | ICD-10-CM | POA: Diagnosis present

## 2020-03-19 DIAGNOSIS — Z7901 Long term (current) use of anticoagulants: Secondary | ICD-10-CM

## 2020-03-19 DIAGNOSIS — R270 Ataxia, unspecified: Secondary | ICD-10-CM

## 2020-03-19 DIAGNOSIS — R531 Weakness: Secondary | ICD-10-CM | POA: Diagnosis not present

## 2020-03-19 DIAGNOSIS — Z794 Long term (current) use of insulin: Secondary | ICD-10-CM

## 2020-03-19 DIAGNOSIS — G459 Transient cerebral ischemic attack, unspecified: Secondary | ICD-10-CM | POA: Diagnosis not present

## 2020-03-19 DIAGNOSIS — F1010 Alcohol abuse, uncomplicated: Principal | ICD-10-CM | POA: Diagnosis present

## 2020-03-19 DIAGNOSIS — Z20822 Contact with and (suspected) exposure to covid-19: Secondary | ICD-10-CM | POA: Diagnosis present

## 2020-03-19 DIAGNOSIS — E1151 Type 2 diabetes mellitus with diabetic peripheral angiopathy without gangrene: Secondary | ICD-10-CM | POA: Diagnosis present

## 2020-03-19 DIAGNOSIS — R0902 Hypoxemia: Secondary | ICD-10-CM | POA: Diagnosis not present

## 2020-03-19 DIAGNOSIS — R58 Hemorrhage, not elsewhere classified: Secondary | ICD-10-CM | POA: Diagnosis not present

## 2020-03-19 DIAGNOSIS — R339 Retention of urine, unspecified: Secondary | ICD-10-CM | POA: Diagnosis present

## 2020-03-19 DIAGNOSIS — Z86711 Personal history of pulmonary embolism: Secondary | ICD-10-CM

## 2020-03-19 DIAGNOSIS — K219 Gastro-esophageal reflux disease without esophagitis: Secondary | ICD-10-CM | POA: Diagnosis present

## 2020-03-19 DIAGNOSIS — E785 Hyperlipidemia, unspecified: Secondary | ICD-10-CM | POA: Diagnosis present

## 2020-03-19 DIAGNOSIS — Z79899 Other long term (current) drug therapy: Secondary | ICD-10-CM

## 2020-03-19 DIAGNOSIS — E119 Type 2 diabetes mellitus without complications: Secondary | ICD-10-CM

## 2020-03-19 DIAGNOSIS — R1084 Generalized abdominal pain: Secondary | ICD-10-CM | POA: Diagnosis not present

## 2020-03-19 DIAGNOSIS — R2689 Other abnormalities of gait and mobility: Secondary | ICD-10-CM | POA: Diagnosis present

## 2020-03-19 DIAGNOSIS — R109 Unspecified abdominal pain: Secondary | ICD-10-CM

## 2020-03-19 DIAGNOSIS — F17210 Nicotine dependence, cigarettes, uncomplicated: Secondary | ICD-10-CM | POA: Diagnosis present

## 2020-03-19 DIAGNOSIS — R52 Pain, unspecified: Secondary | ICD-10-CM | POA: Diagnosis not present

## 2020-03-19 DIAGNOSIS — E876 Hypokalemia: Secondary | ICD-10-CM | POA: Diagnosis not present

## 2020-03-19 DIAGNOSIS — M1990 Unspecified osteoarthritis, unspecified site: Secondary | ICD-10-CM | POA: Diagnosis present

## 2020-03-19 DIAGNOSIS — Z833 Family history of diabetes mellitus: Secondary | ICD-10-CM

## 2020-03-19 DIAGNOSIS — Z7951 Long term (current) use of inhaled steroids: Secondary | ICD-10-CM

## 2020-03-19 DIAGNOSIS — E871 Hypo-osmolality and hyponatremia: Secondary | ICD-10-CM | POA: Diagnosis not present

## 2020-03-19 LAB — URINALYSIS, ROUTINE W REFLEX MICROSCOPIC
Bacteria, UA: NONE SEEN
Bilirubin Urine: NEGATIVE
Glucose, UA: >=500 mg/dL — AB
Hgb urine dipstick: NEGATIVE
Ketones, ur: 20 mg/dL — AB
Leukocytes,Ua: NEGATIVE
Nitrite: NEGATIVE
Protein, ur: NEGATIVE mg/dL
Specific Gravity, Urine: 1.02 (ref 1.005–1.030)
pH: 5 (ref 5.0–8.0)

## 2020-03-19 LAB — RAPID URINE DRUG SCREEN, HOSP PERFORMED
Amphetamines: NOT DETECTED
Barbiturates: NOT DETECTED
Benzodiazepines: NOT DETECTED
Cocaine: NOT DETECTED
Opiates: NOT DETECTED
Tetrahydrocannabinol: NOT DETECTED

## 2020-03-19 LAB — CBC WITH DIFFERENTIAL/PLATELET
Abs Immature Granulocytes: 0.03 10*3/uL (ref 0.00–0.07)
Basophils Absolute: 0 10*3/uL (ref 0.0–0.1)
Basophils Relative: 0 %
Eosinophils Absolute: 0 10*3/uL (ref 0.0–0.5)
Eosinophils Relative: 0 %
HCT: 42.2 % (ref 39.0–52.0)
Hemoglobin: 14.9 g/dL (ref 13.0–17.0)
Immature Granulocytes: 0 %
Lymphocytes Relative: 11 %
Lymphs Abs: 0.8 10*3/uL (ref 0.7–4.0)
MCH: 34 pg (ref 26.0–34.0)
MCHC: 35.3 g/dL (ref 30.0–36.0)
MCV: 96.3 fL (ref 80.0–100.0)
Monocytes Absolute: 0.9 10*3/uL (ref 0.1–1.0)
Monocytes Relative: 13 %
Neutro Abs: 5.3 10*3/uL (ref 1.7–7.7)
Neutrophils Relative %: 76 %
Platelets: 122 10*3/uL — ABNORMAL LOW (ref 150–400)
RBC: 4.38 MIL/uL (ref 4.22–5.81)
RDW: 12.7 % (ref 11.5–15.5)
WBC: 7 10*3/uL (ref 4.0–10.5)
nRBC: 0 % (ref 0.0–0.2)

## 2020-03-19 LAB — COMPREHENSIVE METABOLIC PANEL
ALT: 28 U/L (ref 0–44)
AST: 35 U/L (ref 15–41)
Albumin: 3.3 g/dL — ABNORMAL LOW (ref 3.5–5.0)
Alkaline Phosphatase: 65 U/L (ref 38–126)
Anion gap: 12 (ref 5–15)
BUN: 16 mg/dL (ref 8–23)
CO2: 25 mmol/L (ref 22–32)
Calcium: 8.9 mg/dL (ref 8.9–10.3)
Chloride: 94 mmol/L — ABNORMAL LOW (ref 98–111)
Creatinine, Ser: 1.28 mg/dL — ABNORMAL HIGH (ref 0.61–1.24)
GFR, Estimated: >60 mL/min (ref >60)
Glucose, Bld: 123 mg/dL — ABNORMAL HIGH (ref 70–99)
Potassium: 4.1 mmol/L (ref 3.5–5.1)
Sodium: 131 mmol/L — ABNORMAL LOW (ref 135–145)
Total Bilirubin: 1.2 mg/dL (ref 0.3–1.2)
Total Protein: 7 g/dL (ref 6.5–8.1)

## 2020-03-19 LAB — TROPONIN I (HIGH SENSITIVITY): Troponin I (High Sensitivity): 10 ng/L (ref <18)

## 2020-03-19 LAB — T4, FREE: Free T4: 0.9 ng/dL (ref 0.61–1.12)

## 2020-03-19 LAB — BRAIN NATRIURETIC PEPTIDE: B Natriuretic Peptide: 24.5 pg/mL (ref 0.0–100.0)

## 2020-03-19 LAB — RESP PANEL BY RT-PCR (FLU A&B, COVID) ARPGX2
Influenza A by PCR: NEGATIVE
Influenza B by PCR: NEGATIVE
SARS Coronavirus 2 by RT PCR: NEGATIVE

## 2020-03-19 LAB — ETHANOL: Alcohol, Ethyl (B): <10 mg/dL (ref <10)

## 2020-03-19 LAB — LIPASE, BLOOD: Lipase: 21 U/L (ref 11–51)

## 2020-03-19 MED ORDER — IOHEXOL 350 MG/ML SOLN
100.0000 mL | Freq: Once | INTRAVENOUS | Status: AC | PRN
  Administered 2020-03-19: 100 mL via INTRAVENOUS

## 2020-03-19 NOTE — ED Notes (Signed)
Patient transported to CT 

## 2020-03-19 NOTE — ED Triage Notes (Signed)
Pt from home with EMS; hx of DVT with Xarelto, followed by head bleed and dc of Xarelto. Most recently had left PE and Xarelto started again three weeks ago.  Today reports RUQ and has not eaten today.  Pt with hx of ETOH abuse with cessation yesterday. Hx of COPD.

## 2020-03-19 NOTE — ED Provider Notes (Signed)
[facility] EMERGENCY DEPARTMENT Provider Note   CSN: [id number] Arrival date & time: 03/19/20  1942     History Chief Complaint  Patient presents with  . Abdominal Pain    Terry Munoz is a 69 y.o. male.  HPI Patient is a 69 year old male with a history of COPD, T2DM, HLD, HTN, peripheral vascular disease who presents to ED with multiple complaints.  When patient is asked why he is here, he states that he has been having intermittent abdominal pain for the last several weeks.  He complains of pain that moves from the right side to the left side, comes on every several days, last for approximately 1 hour per episode and is described as aching.  Patient was having pain earlier today, but is currently free of abdominal pain.  Patient contradicts himself frequently during interview and does not seem to be a reliable historian.  Patient is accompanied by family member, who states that the reason she wanted patient to be evaluated was for altered mental status.  Starting yesterday afternoon, patient has been unsteady on his feet, had a shuffling gait, been confused and had difficulty answering questions, and has not been himself.  She also notes a newly developed tremor and strange movements in his hands especially on the left.  She also reports that patient had a fall earlier today.  She tried to reach out and catch him, but they ended up falling together.  Patient hit his side on the coffee table.  Patient's family says patient did not have head injury or loss of consciousness.  However, patient was weak and had difficulty standing up.  They ended up having to call for help from family and then EMS to mobilize the patient.  Of note, patient was recently restarted on Xarelto several weeks ago for a pulmonary embolism.  Prior to that he had been off anticoagulation for some time due to an intracranial hemorrhage in July 2021.  Also of note, patient has an extensive history of  alcohol use.  He endorses quitting drinking as of yesterday and says his last drink was yesterday around six or 7 PM.  However, his wife states that he did have a few sips of a drink earlier today.  Patient states his typical daily consumption is a sixpack.    Past Medical History:  Diagnosis Date  . COPD (chronic obstructive pulmonary disease) ([place])   . Diabetes mellitus without complication ([place])    INSULIN DEPENDENT  . Hyperlipidemia 10/04/2019  . Hypertension   . Hyponatremia 03/2014  . Insomnia   . Kidney stones   . Mouth ulcers   . Osteoarthritis   . Otitis media   . Peripheral vascular disease ([place])   . Sinusitis    Patient Active Problem List   Diagnosis Date Noted  . Lung nodule 10/04/2019  . Hyperlipidemia 10/04/2019  . ICH (intracerebral hemorrhage) ([place]) 10/04/2019  . Seizure ([place]) 10/04/2019  . Intracranial bleed ([place]) 10/03/2019  . COPD (chronic obstructive pulmonary disease) ([place])   . Leukocytosis   . 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 PVD (peripheral vascular disease) with claudication ([place]) 05/01/2014  . COPD exacerbation ([place]) 04/21/2014  . possible Aspiration pneumonia 04/21/2014  . DM type 2 (diabetes mellitus, type 2) ([place]) 04/21/2014  . Alcohol withdrawal delirium ([place]) 04/20/2014  . Diabetes mellitus, stable ([place]) 04/20/2014  . Essential hypertension 04/20/2014  . Hyponatremia 04/20/2014  . Tobacco abuse 04/20/2014  . Claudication of right lower extremity (  [place]) 04/18/2014  . Peripheral artery disease ([place]) 04/18/2014    Past Surgical History:  Procedure Laterality Date  . DENTAL SURGERY    . FEMORAL-POPLITEAL BYPASS GRAFT Right 07/24/2015   Procedure: Thrombectomy Right Femoral-Popliteal Artery; Thrombectomy Right Anterior-Tibial Artery, Thrombectomy Right Tibial-Peroneal Trunk; Bovine Patch Angioplasty Right Popliteal Artery; Endarterectomy Right Anterior Tibial Artery and Right Tibial-Peroneal Trunk; Right Leg Angiogram, Four  Compartment Fasciotomy Right Lower Leg;  Surgeon: Conrad Centerville, MD;  Location: [facility];  Service: Vascular;  L  . HERNIA REPAIR    . lithotomy    . LOWER EXTREMITY ANGIOGRAM N/A 05/01/2014   Procedure: LOWER EXTREMITY ANGIOGRAM;  Surgeon: Laverda Page, MD;  Location: [facility] CATH LAB;  Service: Cardiovascular;  Laterality: N/A;  . NECK SURGERY    . POPLITEAL ARTERY ANGIOPLASTY Right 05/02/2014   to mid sfa  . THROMBECTOMY  05/02/2014       Family History  Problem Relation Age of Onset  . Heart disease Mother   . Hypertension Mother   . Hypertension Father   . Hypertension Sister   . Hypertension Brother   . Diabetes Brother   . Hypertension Sister     Social History   Tobacco Use  . Smoking status: Current Every Day Smoker    Packs/day: 2.00    Years: 45.00    Pack years: 90.00    Types: Cigarettes  . Smokeless tobacco: Never Used  Vaping Use  . Vaping Use: Some days  Substance Use Topics  . Alcohol use: Yes    Alcohol/week: 24.0 standard drinks    Types: 24 Cans of beer per week    Comment: daily  . Drug use: No    Home Medications Prior to Admission medications   Medication Sig Start Date End Date Taking? Authorizing Provider  albuterol (PROVENTIL HFA;VENTOLIN HFA) 108 (90 Base) MCG/ACT inhaler Inhale 1-2 puffs into the lungs every 6 (six) hours as needed for wheezing or shortness of breath.    [provider]  albuterol (PROVENTIL) (2.5 MG/3ML) 0.083% nebulizer solution Take 2.5 mg by nebulization every 6 (six) hours as needed for wheezing or shortness of breath.     [provider]  amitriptyline (ELAVIL) 50 MG tablet Take 50 mg by mouth at bedtime.    [provider]  atorvastatin (LIPITOR) 40 MG tablet TAKE 1 TABLET DAILY Patient taking differently: Take 40 mg by mouth daily.  07/01/19   Adrian Prows, MD  budesonide-formoterol [facility]) 160-4.5 MCG/ACT inhaler Inhale 2 puffs into the lungs daily as needed (for shortness of breath).      [provider]  GLYXAMBI 10-5 MG TABS Take 1 tablet by mouth in the morning.  04/09/19   [provider]  insulin glargine (LANTUS) 100 UNIT/ML injection Inject 46 Units into the skin daily after breakfast.     [provider]  insulin lispro (HUMALOG) 100 UNIT/ML injection Inject 3 Units into the skin 3 (three) times daily after meals.    [provider]  irbesartan (AVAPRO) 75 MG tablet Take 1 tablet (75 mg total) by mouth daily. 04/24/14   Geradine Girt, DO  omeprazole (PRILOSEC) 20 MG capsule Take 20 mg by mouth daily as needed (heartburn).    [provider]  thiamine (VITAMIN B-1) 50 MG tablet Take 150 mg by mouth daily.    [provider]    Allergies    Sulfa antibiotics  Review of Systems   Review of Systems  Constitutional: Negative for  chills and fever.  HENT: Negative for ear pain, rhinorrhea and sore throat.   Eyes: Negative for pain and visual disturbance.  Respiratory: Negative for cough and shortness of breath.   Cardiovascular: Negative for chest pain and leg swelling.  Gastrointestinal: Positive for abdominal pain. Negative for blood in stool, diarrhea, nausea and vomiting.  Genitourinary: Negative for dysuria, enuresis and hematuria.  Musculoskeletal: Positive for gait problem. Negative for joint swelling.  Skin: Negative for color change and rash.  Neurological: Positive for light-headedness. Negative for syncope and headaches.  Psychiatric/Behavioral: Negative for suicidal ideas.  All other systems reviewed and are negative.   Physical Exam Updated Vital Signs BP 135/90   Pulse (!) 109   Temp 99.8 F (37.7 C) (Oral)   Resp (!) 25   SpO2 92%   Physical Exam Vitals and nursing note reviewed.  Constitutional:      General: He is not in acute distress.    Appearance: He is well-developed and well-nourished. He is not ill-appearing or toxic-appearing.  HENT:     Head: Normocephalic and atraumatic.      Comments: Multiple scars over nose and scattered areas of scale and erythematous patches c/w history of skin cancers    Nose: Nose normal.     Mouth/Throat:     Mouth: Mucous membranes are moist.     Pharynx: Oropharynx is clear.  Eyes:     General: No scleral icterus.    Extraocular Movements: Extraocular movements intact.     Pupils: Pupils are equal, round, and reactive to light.  Cardiovascular:     Rate and Rhythm: Tachycardia present.     Heart sounds: No friction rub. No gallop.   Pulmonary:     Effort: Pulmonary effort is normal. No respiratory distress.     Breath sounds: Rales present.  Abdominal:     Palpations: Abdomen is soft.  Musculoskeletal:        General: No edema.     Cervical back: Neck supple.     Right lower leg: No edema.     Left lower leg: No edema.  Skin:    General: Skin is warm and dry.  Neurological:     Mental Status: He is alert.     GCS: GCS eye subscore is 4. GCS verbal subscore is 5. GCS motor subscore is 6.     Cranial Nerves: Cranial nerves are intact.     Sensory: Sensation is intact.     Motor: Tremor present.     Comments: General: Alert, following commands appropriately Orientation: Oriented to self, person, place, DISORIENTED TO TIME Language: Clear, speech is normal Fund of knowledge: Seems confused  CN: II - vision grossly normal III, IV, VI - EOMI V - normal facial sensation VII - no facial asymmetry, normal smile VIII - hearing grossly normal XI - head is midline XII - tongue midline, normal motor control  Motor: 5/5 strength in all major muscle groups. Normal tone and muscle bulk in upper and lower extremities.  Sensory: Normal sensation to light touch.  Gait: Shuffling but able to take several steps. BECAME VERY LIGHTHEADED SOON AFTER STARTING HIS ATTEMPT.   Psychiatric:        Mood and Affect: Mood and affect normal.        Behavior: Behavior normal.     ED Results / Procedures / Treatments   Labs (all labs  ordered are listed, but only abnormal results are displayed) Labs Reviewed - No data to display  EKG None  Radiology No results found.  Procedures Procedures (including critical care time)  Medications Ordered in ED Medications - No data to display  ED Course  I have reviewed the triage vital signs and the nursing notes.  Pertinent labs & imaging results that were available during my care of the patient were reviewed by me and considered in my medical decision making (see chart for details).    MDM Rules/Calculators/A&P                          69 y/o male with altered mental status, recent fall on anticoagulation, possible history intermittent abdominal pain. Pt is tachycardic on presentation, intermittently hypoxic to the high 80s on room air, and becomes lightheaded with minimal exertion. He is confused and disoriented to time. Given difficulty obtaining reliable history in this pt with multiple comorbidities, especially in the context of recent trauma, will obtain CT imaging of head, cervical spine chest/abdomen/pelvis. Will obtain CTA PE study of the chest considering patient's recent history of known pulmonary embolism and tachycardia on presentation today.  Differentials considered: I am most concerned for intracranial hemorrhage, but differential includes alcohol intoxication, alcohol withdrawal, substance use, metabolic encephalopathy, ischemic stroke, hyperammonemia, uremia, worsening PVD or neuropathy, pneumonia, urosepsis. ED provider interpretation of lab results: Mild thrombocytopenia (122), hyponatremia (131), stable elevated creatinine, hypoalbuminemia (3.3).  Initial troponin 10 -repeat pending at time of handoff to night team.  COVID-19 and flu A/B are negative.  EtOH negative.  Free T4 are WNL. TSH pending. UA with glucosuria but no evidence of infection.  UDS negative.  Blood cultures drawn and pending. ED provider interpretation of imaging results: CT head without  acute intracranial process.  Cervical spine without acute fracture; there are postsurgical changes C3-C7.  CXR without acute findings.  CTA chest and CT abdomen/pelvis pending at time of handoff. ED provider interpretation of ECG: Sinus tachycardia with a rate of 101.  QRS 106, QTc 446.  There is no STEMI.  There is no evidence of acute ischemia/infarct. Q waves in anterior leads consistent with old infarct.  MDM: Patient has remained hemodynamically stable throughout ED course.  However, he is persistently tachycardic with multiple imaging and lab results pending at time of handoff.  There is concern for significant metabolic derangement or intra-abdominal process especially light of patient's recent trauma, anticoagulated status, and multiple comorbidities.  Have communicated history, work-up, and current plan to oncoming ED provider.  Please see their note for documentation of the remainder this encounter and final disposition.  Anticipate likely admission for observation, further evaluation of his possible PE burden, possible MRI to evaluate for recent neurologic changes including relatively new tremor and changes in his gait, and to monitor for further mental status change.  Final Clinical Impression(s) / ED Diagnoses Final diagnoses:  Altered mental status, unspecified altered mental status type  Abdominal pain, unspecified abdominal location     Vanna Scotland, MD [id number]    Gareth Morgan, MD 03/21/20 1521

## 2020-03-19 NOTE — ED Provider Notes (Signed)
Patient is a 69 year old male whose care was transferred to me at shift change from Dr. Lyndel Safe. Her HPI is below: Patient is a 69 year old male with a history of COPD, T2DM, HLD, HTN, peripheral vascular disease who presents to ED with multiple complaints.  When patient is asked why he is here, he states that he has been having intermittent abdominal pain for the last several weeks.  He complains of pain that moves from the right side to the left side, comes on every several days, last for approximately 1 hour per episode and is described as aching.  Patient was having pain earlier today, but is currently free of abdominal pain.  Patient contradicts himself frequently during interview and does not seem like an entirely reliable historian.  Patient is accompanied by family member, who states that the reason she wanted patient to be evaluated was for altered mental status.  Starting yesterday afternoon, patient has been unsteady on his feet, had a shuffling gait, been confused and had difficulty answering questions, and has not been himself.  She also notes a newly developed tremor and strange movements in his hands especially on the left.  She also describes that patient had a fall earlier today.  She tried to reach out and catch him, but they ended up falling together.  Patient hit his side on the coffee table.  Patient's family says patient did not have head injury or loss of consciousness.  However, patient was weak and had difficulty standing up.  They ended up having to call for help from family and then EMS to mobilize the patient.  Of note, patient was recently restarted on Xarelto several weeks ago for a pulmonary embolism.  Prior to that he had been off anticoagulation for some time due to an intracranial hemorrhage in July 2021.  Physical Exam  BP (!) 145/132   Pulse (!) 101   Temp 99.8 F (37.7 C) (Oral)   Resp (!) 24   SpO2 91%   Physical Exam BP 135/90   Pulse (!) 109   Temp 99.8 F  (37.7 C) (Oral)   Resp (!) 25   SpO2 92%   Physical Exam Vitals and nursing note reviewed.  Constitutional:      General: He is not in acute distress.    Appearance: He is well-developed and well-nourished. He is not ill-appearing or toxic-appearing.  HENT:     Head: Normocephalic and atraumatic.     Comments: Multiple scars over nose and scattered areas of scale and erythematous patches c/w history of skin cancers    Nose: Nose normal.     Mouth/Throat:     Mouth: Mucous membranes are moist.     Pharynx: Oropharynx is clear.  Eyes:     General: No scleral icterus.    Extraocular Movements: Extraocular movements intact.     Pupils: Pupils are equal, round, and reactive to light.  Cardiovascular:     Rate and Rhythm: Tachycardia present.     Heart sounds: No friction rub. No gallop.   Pulmonary:     Effort: Pulmonary effort is normal. No respiratory distress.     Breath sounds: Rales present.  Abdominal:     Palpations: Abdomen is soft.  Musculoskeletal:        General: No edema.     Cervical back: Neck supple.     Right lower leg: No edema.     Left lower leg: No edema.  Skin:    General: Skin is warm  and dry.  Neurological:     Mental Status: He is alert.     GCS: GCS eye subscore is 4. GCS verbal subscore is 5. GCS motor subscore is 6.     Cranial Nerves: Cranial nerves are intact.     Sensory: Sensation is intact.     Motor: Tremor present.     Comments: General: Alert, following commands appropriately Orientation: Oriented to self, person, place, DISORIENTED TO TIME Language: Clear, speech is normal Fund of knowledge: Seems confused  CN: II - vision grossly normal III, IV, VI - EOMI V - normal facial sensation VII - no facial asymmetry, normal smile VIII - hearing grossly normal XI - head is midline XII - tongue midline, normal motor control  Motor: 5/5 strength in all major muscle groups. Normal tone and muscle bulk in upper and lower  extremities.  Sensory: Normal sensation to light touch.  Gait: Shuffling but able to take several steps. BECAME VERY LIGHTHEADED SOON AFTER STARTING HIS ATTEMPT.   Psychiatric:        Mood and Affect: Mood and affect normal.        Behavior: Behavior normal.   ED Course/Procedures   Clinical Course as of 03/20/20 0248  Sat Mar 20, 2020  0203 CT concerning for urinary retention.  Nursing staff notes that patient has been using the bedside commode.  Post void residual obtained showing nearly 600 cc of urine.  We will have nursing staff place a Foley catheter. [LJ]  0246 Patient resting comfortably in bed.  Heart rate improved to the mid 90s.  Normotensive.  Oxygen saturations in the high 90s on room air.  Foley catheter bag with about 600 cc of urine. [LJ]    Clinical Course User Index [LJ] Rayna Sexton, PA-C    Procedures  MDM  Patient is a 69 year old male with a complicated medical history whose care was transferred to me at shift change from Dr. Lyndel Safe.  He was brought in by his wife due to altered mental status, recent fall and anticoagulation, as well as a history of possible intermittent abdominal pain.  He was found to be intermittently hypoxic in the high 80s on room air as well as tachycardic.  CT imaging as well as x-rays were all generally reassuring.  CT of the abdomen does show a markedly distended urinary bladder.  Nursing staff does note patient has been using the bedside commode throughout his stay.  Bladder scan was obtained showing nearly 600 cc of retained urine.  Foley catheter was placed.  Foley catheter bag now has about 600 cc of urine.  Patient is sleeping comfortably in bed.  Tachycardia has improved.  He is now normotensive.  Oxygen saturations in the high 90s on room air.  Although patient's vital signs have improved, he still appears extremely fatigued.  Does not use a walker or cane at baseline.  More falls recently.  Patient unable to sit up or ambulate  in the emergency department.  Feel that admission for observation is necessary at this time.  Will discuss with the hospitalist team for admission.      Rayna Sexton, PA-C 03/20/20 [id number]    Merrily Pew, MD 03/20/20 [phone]

## 2020-03-20 ENCOUNTER — Observation Stay (HOSPITAL_COMMUNITY): Payer: Medicare Other

## 2020-03-20 DIAGNOSIS — F1010 Alcohol abuse, uncomplicated: Secondary | ICD-10-CM | POA: Diagnosis present

## 2020-03-20 DIAGNOSIS — I10 Essential (primary) hypertension: Secondary | ICD-10-CM | POA: Diagnosis present

## 2020-03-20 DIAGNOSIS — E871 Hypo-osmolality and hyponatremia: Secondary | ICD-10-CM | POA: Diagnosis not present

## 2020-03-20 DIAGNOSIS — Z794 Long term (current) use of insulin: Secondary | ICD-10-CM | POA: Diagnosis not present

## 2020-03-20 DIAGNOSIS — R2689 Other abnormalities of gait and mobility: Secondary | ICD-10-CM | POA: Diagnosis present

## 2020-03-20 DIAGNOSIS — R109 Unspecified abdominal pain: Secondary | ICD-10-CM

## 2020-03-20 DIAGNOSIS — Z833 Family history of diabetes mellitus: Secondary | ICD-10-CM | POA: Diagnosis not present

## 2020-03-20 DIAGNOSIS — E1159 Type 2 diabetes mellitus with other circulatory complications: Secondary | ICD-10-CM

## 2020-03-20 DIAGNOSIS — R338 Other retention of urine: Secondary | ICD-10-CM | POA: Diagnosis present

## 2020-03-20 DIAGNOSIS — R270 Ataxia, unspecified: Secondary | ICD-10-CM

## 2020-03-20 DIAGNOSIS — D6959 Other secondary thrombocytopenia: Secondary | ICD-10-CM | POA: Diagnosis present

## 2020-03-20 DIAGNOSIS — R531 Weakness: Secondary | ICD-10-CM | POA: Diagnosis present

## 2020-03-20 DIAGNOSIS — Z20822 Contact with and (suspected) exposure to covid-19: Secondary | ICD-10-CM | POA: Diagnosis present

## 2020-03-20 DIAGNOSIS — R4182 Altered mental status, unspecified: Secondary | ICD-10-CM | POA: Diagnosis not present

## 2020-03-20 DIAGNOSIS — G9341 Metabolic encephalopathy: Secondary | ICD-10-CM | POA: Diagnosis present

## 2020-03-20 DIAGNOSIS — E1151 Type 2 diabetes mellitus with diabetic peripheral angiopathy without gangrene: Secondary | ICD-10-CM | POA: Diagnosis present

## 2020-03-20 DIAGNOSIS — Z86711 Personal history of pulmonary embolism: Secondary | ICD-10-CM | POA: Diagnosis not present

## 2020-03-20 DIAGNOSIS — Z882 Allergy status to sulfonamides status: Secondary | ICD-10-CM | POA: Diagnosis not present

## 2020-03-20 DIAGNOSIS — I2699 Other pulmonary embolism without acute cor pulmonale: Secondary | ICD-10-CM | POA: Diagnosis present

## 2020-03-20 DIAGNOSIS — K219 Gastro-esophageal reflux disease without esophagitis: Secondary | ICD-10-CM | POA: Diagnosis present

## 2020-03-20 DIAGNOSIS — E876 Hypokalemia: Secondary | ICD-10-CM | POA: Diagnosis not present

## 2020-03-20 DIAGNOSIS — F17210 Nicotine dependence, cigarettes, uncomplicated: Secondary | ICD-10-CM | POA: Diagnosis present

## 2020-03-20 DIAGNOSIS — Z79899 Other long term (current) drug therapy: Secondary | ICD-10-CM | POA: Diagnosis not present

## 2020-03-20 DIAGNOSIS — R0902 Hypoxemia: Secondary | ICD-10-CM | POA: Diagnosis present

## 2020-03-20 DIAGNOSIS — E785 Hyperlipidemia, unspecified: Secondary | ICD-10-CM | POA: Diagnosis present

## 2020-03-20 DIAGNOSIS — Z7901 Long term (current) use of anticoagulants: Secondary | ICD-10-CM | POA: Diagnosis not present

## 2020-03-20 DIAGNOSIS — R339 Retention of urine, unspecified: Secondary | ICD-10-CM | POA: Diagnosis present

## 2020-03-20 DIAGNOSIS — J449 Chronic obstructive pulmonary disease, unspecified: Secondary | ICD-10-CM | POA: Diagnosis present

## 2020-03-20 DIAGNOSIS — M1990 Unspecified osteoarthritis, unspecified site: Secondary | ICD-10-CM | POA: Diagnosis present

## 2020-03-20 DIAGNOSIS — Z8249 Family history of ischemic heart disease and other diseases of the circulatory system: Secondary | ICD-10-CM | POA: Diagnosis not present

## 2020-03-20 DIAGNOSIS — Z7951 Long term (current) use of inhaled steroids: Secondary | ICD-10-CM | POA: Diagnosis not present

## 2020-03-20 LAB — URINE CULTURE: Culture: NO GROWTH

## 2020-03-20 LAB — CBG MONITORING, ED
Glucose-Capillary: 60 mg/dL — ABNORMAL LOW (ref 70–99)
Glucose-Capillary: 157 mg/dL — ABNORMAL HIGH (ref 70–99)
Glucose-Capillary: 121 mg/dL — ABNORMAL HIGH (ref 70–99)

## 2020-03-20 LAB — HIV ANTIBODY (ROUTINE TESTING W REFLEX): HIV Screen 4th Generation wRfx: NONREACTIVE

## 2020-03-20 LAB — HEMOGLOBIN A1C
Hgb A1c MFr Bld: 7.7 % — ABNORMAL HIGH (ref 4.8–5.6)
Mean Plasma Glucose: 174.29 mg/dL

## 2020-03-20 LAB — GLUCOSE, CAPILLARY
Glucose-Capillary: 82 mg/dL (ref 70–99)
Glucose-Capillary: 139 mg/dL — ABNORMAL HIGH (ref 70–99)

## 2020-03-20 LAB — TROPONIN I (HIGH SENSITIVITY): Troponin I (High Sensitivity): 10 ng/L (ref <18)

## 2020-03-20 LAB — TSH: TSH: 4.11 u[IU]/mL (ref 0.350–4.500)

## 2020-03-20 LAB — AMMONIA: Ammonia: 22 umol/L (ref 9–35)

## 2020-03-20 LAB — MAGNESIUM: Magnesium: 1.7 mg/dL (ref 1.7–2.4)

## 2020-03-20 LAB — FOLATE: Folate: 16.4 ng/mL (ref >5.9)

## 2020-03-20 LAB — PHOSPHORUS: Phosphorus: 3.2 mg/dL (ref 2.5–4.6)

## 2020-03-20 LAB — VITAMIN B12: Vitamin B-12: 466 pg/mL (ref 180–914)

## 2020-03-20 MED ORDER — FOLIC ACID 1 MG PO TABS
1.0000 mg | ORAL_TABLET | Freq: Every day | ORAL | Status: DC
  Administered 2020-03-20 – 2020-03-21 (×2): 1 mg via ORAL
  Filled 2020-03-20 (×2): qty 1

## 2020-03-20 MED ORDER — INSULIN ASPART 100 UNIT/ML ~~LOC~~ SOLN
3.0000 [IU] | Freq: Three times a day (TID) | SUBCUTANEOUS | Status: DC
  Administered 2020-03-20 – 2020-03-21 (×3): 3 [IU] via SUBCUTANEOUS

## 2020-03-20 MED ORDER — ACETAMINOPHEN 650 MG RE SUPP: 650.0000 mg | Freq: Four times a day (QID) | RECTAL | Status: DC | PRN

## 2020-03-20 MED ORDER — MOMETASONE FURO-FORMOTEROL FUM 200-5 MCG/ACT IN AERO
2.0000 | INHALATION_SPRAY | Freq: Two times a day (BID) | RESPIRATORY_TRACT | Status: DC
  Administered 2020-03-20 – 2020-03-21 (×2): 2 via RESPIRATORY_TRACT
  Filled 2020-03-20: qty 8.8

## 2020-03-20 MED ORDER — AMITRIPTYLINE HCL 25 MG PO TABS
50.0000 mg | ORAL_TABLET | Freq: Every day | ORAL | Status: DC
Start: 2020-03-20 — End: 2020-03-21
  Administered 2020-03-20: 21:00:00 50 mg via ORAL
  Filled 2020-03-20: qty 2

## 2020-03-20 MED ORDER — ACETAMINOPHEN 325 MG PO TABS: 650.0000 mg | ORAL_TABLET | Freq: Four times a day (QID) | ORAL | Status: DC | PRN

## 2020-03-20 MED ORDER — INSULIN ASPART 100 UNIT/ML ~~LOC~~ SOLN
0.0000 [IU] | Freq: Three times a day (TID) | SUBCUTANEOUS | Status: DC
  Administered 2020-03-20: 13:00:00 2 [IU] via SUBCUTANEOUS
  Administered 2020-03-21: 12:00:00 3 [IU] via SUBCUTANEOUS

## 2020-03-20 MED ORDER — EMPAGLIFLOZIN-LINAGLIPTIN 10-5 MG PO TABS: 1.0000 | ORAL_TABLET | Freq: Every day | ORAL | Status: DC

## 2020-03-20 MED ORDER — ALBUTEROL SULFATE (2.5 MG/3ML) 0.083% IN NEBU: 2.5000 mg | INHALATION_SOLUTION | Freq: Four times a day (QID) | RESPIRATORY_TRACT | Status: DC | PRN

## 2020-03-20 MED ORDER — LORAZEPAM 1 MG PO TABS: 1.0000 mg | ORAL_TABLET | ORAL | Status: DC | PRN

## 2020-03-20 MED ORDER — CHLORHEXIDINE GLUCONATE CLOTH 2 % EX PADS
6.0000 | MEDICATED_PAD | Freq: Every day | CUTANEOUS | Status: DC
  Administered 2020-03-20 – 2020-03-21 (×2): 6 via TOPICAL

## 2020-03-20 MED ORDER — ONDANSETRON HCL 4 MG PO TABS: 4.0000 mg | ORAL_TABLET | Freq: Four times a day (QID) | ORAL | Status: DC | PRN

## 2020-03-20 MED ORDER — ATORVASTATIN CALCIUM 40 MG PO TABS
40.0000 mg | ORAL_TABLET | Freq: Every day | ORAL | Status: DC
  Administered 2020-03-20: 21:00:00 40 mg via ORAL
  Filled 2020-03-20: qty 1

## 2020-03-20 MED ORDER — LINAGLIPTIN 5 MG PO TABS
5.0000 mg | ORAL_TABLET | Freq: Every day | ORAL | Status: DC
  Administered 2020-03-20: 21:00:00 5 mg via ORAL
  Filled 2020-03-20 (×2): qty 1

## 2020-03-20 MED ORDER — IRBESARTAN 75 MG PO TABS
75.0000 mg | ORAL_TABLET | Freq: Every day | ORAL | Status: DC
  Administered 2020-03-20: 21:00:00 75 mg via ORAL
  Filled 2020-03-20 (×2): qty 1

## 2020-03-20 MED ORDER — NICOTINE 14 MG/24HR TD PT24
14.0000 mg | MEDICATED_PATCH | Freq: Every day | TRANSDERMAL | Status: DC
  Administered 2020-03-20 – 2020-03-21 (×2): 14 mg via TRANSDERMAL
  Filled 2020-03-20 (×2): qty 1

## 2020-03-20 MED ORDER — INSULIN GLARGINE 100 UNIT/ML ~~LOC~~ SOLN
30.0000 [IU] | Freq: Every day | SUBCUTANEOUS | Status: DC
  Administered 2020-03-20 – 2020-03-21 (×2): 30 [IU] via SUBCUTANEOUS
  Filled 2020-03-20 (×2): qty 0.3

## 2020-03-20 MED ORDER — MAGNESIUM SULFATE 2 GM/50ML IV SOLN
2.0000 g | Freq: Once | INTRAVENOUS | Status: AC
  Administered 2020-03-20: 14:00:00 2 g via INTRAVENOUS
  Filled 2020-03-20: qty 50

## 2020-03-20 MED ORDER — THIAMINE HCL 100 MG/ML IJ SOLN
500.0000 mg | Freq: Three times a day (TID) | INTRAVENOUS | Status: DC
  Administered 2020-03-20 – 2020-03-21 (×4): 500 mg via INTRAVENOUS
  Filled 2020-03-20 (×6): qty 5

## 2020-03-20 MED ORDER — LORAZEPAM 2 MG/ML IJ SOLN: 1.0000 mg | INTRAMUSCULAR | Status: DC | PRN

## 2020-03-20 MED ORDER — THIAMINE HCL 100 MG/ML IJ SOLN: 250.0000 mg | INTRAVENOUS | Status: DC

## 2020-03-20 MED ORDER — ADULT MULTIVITAMIN W/MINERALS CH
1.0000 | ORAL_TABLET | Freq: Every day | ORAL | Status: DC
  Administered 2020-03-20 – 2020-03-21 (×2): 1 via ORAL
  Filled 2020-03-20 (×2): qty 1

## 2020-03-20 MED ORDER — RIVAROXABAN 20 MG PO TABS: 20.0000 mg | ORAL_TABLET | Freq: Every day | ORAL | Status: DC

## 2020-03-20 MED ORDER — ALBUTEROL SULFATE HFA 108 (90 BASE) MCG/ACT IN AERS: 1.0000 | INHALATION_SPRAY | Freq: Four times a day (QID) | RESPIRATORY_TRACT | Status: DC | PRN

## 2020-03-20 MED ORDER — ONDANSETRON HCL 4 MG/2ML IJ SOLN: 4.0000 mg | Freq: Four times a day (QID) | INTRAMUSCULAR | Status: DC | PRN

## 2020-03-20 MED ORDER — RIVAROXABAN 15 MG PO TABS
15.0000 mg | ORAL_TABLET | Freq: Two times a day (BID) | ORAL | Status: DC
  Administered 2020-03-20 (×2): 15 mg via ORAL
  Filled 2020-03-20 (×4): qty 1

## 2020-03-20 MED ORDER — EMPAGLIFLOZIN 10 MG PO TABS
10.0000 mg | ORAL_TABLET | Freq: Every day | ORAL | Status: DC
  Administered 2020-03-20: 21:00:00 10 mg via ORAL
  Filled 2020-03-20 (×2): qty 1

## 2020-03-20 MED ORDER — PANTOPRAZOLE SODIUM 40 MG PO TBEC
40.0000 mg | DELAYED_RELEASE_TABLET | Freq: Every day | ORAL | Status: DC
  Administered 2020-03-20 – 2020-03-21 (×2): 40 mg via ORAL
  Filled 2020-03-20 (×2): qty 1

## 2020-03-20 NOTE — H&P (Signed)
History and Physical    Terry Munoz TGG:[id number] DOB: [birth year] DOA: 03/19/2020  PCP: Aletha Halim., PA-C  Patient coming from: Home  I have personally briefly reviewed patient's old medical records in [place]  Chief Complaint: Gait instability  HPI: Terry Munoz is a 69 y.o. male with medical history significant of DM2, HTN, HLD, COPD, EtOH abuse.  Of note pt also had small PE recently diagnosed and started on Xarelto 12/20.  Pt reportedly quit drinking 11/23 around six or 7pm.  Pt brought in by wife who wanted pt evaluated for AMS and unstable gait.  Per wife symptoms onset yesterday afternoon.  Shuffling gait, confusion, difficulty answering questions, and "hasnt been himself".  Wife also reports newly developed tremor and strange movements in his hands, especially on left side.  Pt also had fall earlier yesterday.  No head trauma.  Pt with generalized weakness.  Patient on the other hand has differing stories about why he is here depending on which provider asks him.  EDP got that he was here for intermittent abdominal pain.  When I asked him he stated that he was "unsteady on his feet".  Denies SOB, CP.   ED Course: BAL of 0, UDS neg.  Sodium 131, UA neg, CT chest, abd, pelvis, head, and neck are unremarkable as to the cause of his AMS.   Review of Systems: As per HPI, otherwise all review of systems negative.  Though not sure how reliable ROS is.  Past Medical History:  Diagnosis Date  . COPD (chronic obstructive pulmonary disease) ([place])   . 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 Hyperlipidemia 10/04/2019  . Hypertension   . Hyponatremia 03/2014  . Insomnia   . Kidney stones   . Mouth ulcers   . Osteoarthritis   . Otitis media   . Peripheral vascular disease ([place])   . Sinusitis     Past Surgical History:  Procedure Laterality Date  . DENTAL SURGERY    . FEMORAL-POPLITEAL BYPASS GRAFT Right 07/24/2015    Procedure: Thrombectomy Right Femoral-Popliteal Artery; Thrombectomy Right Anterior-Tibial Artery, Thrombectomy Right Tibial-Peroneal Trunk; Bovine Patch Angioplasty Right Popliteal Artery; Endarterectomy Right Anterior Tibial Artery and Right Tibial-Peroneal Trunk; Right Leg Angiogram, Four Compartment Fasciotomy Right Lower Leg;  Surgeon: Conrad [place], MD;  Location: [facility];  Service: Vascular;  L  . HERNIA REPAIR    . lithotomy    . LOWER EXTREMITY ANGIOGRAM N/A 05/01/2014   Procedure: LOWER EXTREMITY ANGIOGRAM;  Surgeon: Laverda Page, MD;  Location[facility] C[facility];  Service: Cardiovascular;  Laterality: N/A;  . NECK SURGERY    . POPLITEAL ARTERY ANGIOPLASTY Right 05/02/2014   to mid sfa  . THROMBECTOMY  05/02/2014     reports that he has been smoking cigarettes. He has a 90.00 pack-year smoking history. He has never used smokeless tobacco. He reports current alcohol use of about 24.0 standard drinks of alcohol per week. He reports that he does not use drugs.  Allergies  Allergen Reactions  . Sulfa Antibiotics Hives    Family History  Problem Relation Age of Onset  . Heart disease Mother   . Hypertension Mother   . Hypertension Father   . Hypertension Sister   . Hypertension Brother   . Diabetes Brother   . Hypertension Sister      Prior to Admission medications   Medication Sig Start Date End Date Taking? Authorizing Provider  ADVAIR DISKUS 500-50 MCG/DOSE  AEPB Inhale 1 puff into the lungs 2 (two) times daily. 03/11/20  Yes [provider]  albuterol (PROVENTIL HFA;VENTOLIN HFA) 108 (90 Base) MCG/ACT inhaler Inhale 1-2 puffs into the lungs every 6 (six) hours as needed for wheezing or shortness of breath.   Yes [provider]  albuterol (PROVENTIL) (2.5 MG/3ML) 0.083% nebulizer solution Take 2.5 mg by nebulization every 6 (six) hours as needed for wheezing or shortness of breath.    Yes [provider]  amitriptyline (ELAVIL) 50 MG tablet Take 50 mg  by mouth at bedtime.   Yes [provider]  atorvastatin (LIPITOR) 40 MG tablet TAKE 1 TABLET DAILY Patient taking differently: Take 40 mg by mouth at bedtime. 07/01/19  Yes Adrian Prows, MD  budesonide-formoterol [facility]) 160-4.5 MCG/ACT inhaler Inhale 2 puffs into the lungs daily as needed (for shortness of breath).    Yes [provider]  Emollient (AQUAPHILIC) OINT Apply 1 application topically See admin instructions. Apply with efudex as needed   Yes [provider]  fluorouracil (EFUDEX) 5 % cream Apply 1 application topically daily as needed (rash). 03/01/20  Yes [provider]  GLYXAMBI 10-5 MG TABS Take 1 tablet by mouth at bedtime. 04/09/19  Yes [provider]  insulin glargine (LANTUS) 100 UNIT/ML injection Inject 46 Units into the skin daily after breakfast.    Yes [provider]  insulin lispro (HUMALOG) 100 UNIT/ML injection Inject 3 Units into the skin 3 (three) times daily after meals.   Yes [provider]  irbesartan (AVAPRO) 75 MG tablet Take 1 tablet (75 mg total) by mouth daily. Patient taking differently: Take 75 mg by mouth at bedtime. 04/24/14  Yes Geradine Girt, DO  omeprazole (PRILOSEC) 20 MG capsule Take 20 mg by mouth daily as needed (heartburn).   Yes [provider]  Rivaroxaban (XARELTO) 15 MG TABS tablet Take 15 mg by mouth 2 (two) times daily with a meal.   Yes [provider]  thiamine (VITAMIN B-1) 50 MG tablet Take 150 mg by mouth daily.   Yes [provider]  XARELTO 20 MG TABS tablet Take 20 mg by mouth daily. 03/01/20  Yes [provider]    Physical Exam: Vitals:   03/20/20 0345 03/20/20 0400 03/20/20 0415 03/20/20 0430  BP: (!) 156/82 (!) 147/73 (!) 145/78 (!) 128/119  Pulse: (!) 115 100 97 95  Resp: (!) 29 (!) 25 (!) 22   Temp:      TempSrc:      SpO2: 92% 91% 92% 96%    Constitutional: NAD, calm, comfortable Eyes: No nystagmus ENMT: Mucous  membranes are moist. Posterior pharynx clear of any exudate or lesions.Normal dentition.  Neck: normal, supple, no masses, no thyromegaly Respiratory: clear to auscultation bilaterally, no wheezing, no crackles. Normal respiratory effort. No accessory muscle use.  Cardiovascular: Regular rate and rhythm, no murmurs / rubs / gallops. No extremity edema. 2+ pedal pulses. No carotid bruits.  Abdomen: no tenderness, no masses palpated. No hepatosplenomegaly. Bowel sounds positive.  Musculoskeletal: no clubbing / cyanosis. No joint deformity upper and lower extremities. Good ROM, no contractures. Normal muscle tone.  Skin: no rashes, lesions, ulcers. No induration Neurologic: MAE, shuffling gait, ataxia, dysmetria much more so on the LUE compared to RUE.  Not seeing much in the way of tremor. Psychiatric: Oriented to self and location, ? Confabulation   Labs on Admission: I have personally reviewed following labs and imaging studies  CBC: Recent Labs  Lab  03/19/20 2119  WBC 7.0  NEUTROABS 5.3  HGB 14.9  HCT 42.2  MCV 96.3  PLT 123XX123*   Basic Metabolic Panel: Recent Labs  Lab 03/19/20 2119  NA 131*  K 4.1  CL 94*  CO2 25  GLUCOSE 123*  BUN 16  CREATININE 1.28*  CALCIUM 8.9   GFR: CrCl cannot be calculated (Unknown ideal weight.). Liver Function Tests: Recent Labs  Lab 03/19/20 2119  AST 35  ALT 28  ALKPHOS 65  BILITOT 1.2  PROT 7.0  ALBUMIN 3.3*   Recent Labs  Lab 03/19/20 2119  LIPASE 21   No results for input(s): AMMONIA in the last 168 hours. Coagulation Profile: No results for input(s): INR, PROTIME in the last 168 hours. Cardiac Enzymes: No results for input(s): CKTOTAL, CKMB, CKMBINDEX, TROPONINI in the last 168 hours. BNP (last 3 results) No results for input(s): PROBNP in the last 8760 hours. HbA1C: No results for input(s): HGBA1C in the last 72 hours. CBG: No results for input(s): GLUCAP in the last 168 hours. Lipid Profile: No results for input(s):  CHOL, HDL, LDLCALC, TRIG, CHOLHDL, LDLDIRECT in the last 72 hours. Thyroid Function Tests: Recent Labs    03/19/20 2119 03/19/20 2129  TSH  --  4.110  FREET4 0.90  --    Anemia Panel: No results for input(s): VITAMINB12, FOLATE, FERRITIN, TIBC, IRON, RETICCTPCT in the last 72 hours. Urine analysis:    Component Value Date/Time   COLORURINE YELLOW 03/19/2020 2146   APPEARANCEUR CLEAR 03/19/2020 2146   LABSPEC 1.020 03/19/2020 2146   PHURINE 5.0 03/19/2020 2146   GLUCOSEU >=500 (A) 03/19/2020 2146   HGBUR NEGATIVE 03/19/2020 2146   [place] NEGATIVE 03/19/2020 2146   KETONESUR 20 (A) 03/19/2020 2146   PROTEINUR NEGATIVE 03/19/2020 2146   UROBILINOGEN 1.0 04/20/2014 1604   NITRITE NEGATIVE 03/19/2020 2146   LEUKOCYTESUR NEGATIVE 03/19/2020 2146    Radiological Exams on Admission: CT Head Wo Contrast  Result Date: 03/19/2020 CLINICAL DATA:  Right upper quadrant pain, history of alcohol abuse, fell EXAM: CT HEAD WITHOUT CONTRAST TECHNIQUE: Contiguous axial images were obtained from the base of the skull through the vertex without intravenous contrast. COMPARISON:  10/03/2019 FINDINGS: Brain: Scattered hypodensities throughout the periventricular white matter are compatible with chronic small vessel ischemic changes. No signs of acute infarct or hemorrhage. Lateral ventricles and midline structures are unremarkable. No acute extra-axial fluid collections. No mass effect. Vascular: No hyperdense vessel or unexpected calcification. Skull: Normal. Negative for fracture or focal lesion. Sinuses/Orbits: No acute finding. Other: None. IMPRESSION: 1. No acute intracranial process. Electronically Signed   By: Randa Ngo M.D.   On: 03/19/2020 21:53   CT Angio Chest PE W and/or Wo Contrast  Result Date: 03/20/2020 CLINICAL DATA:  Status post fall. EXAM: CT ANGIOGRAPHY CHEST WITH CONTRAST TECHNIQUE: Multidetector CT imaging of the chest was performed using the standard protocol during  bolus administration of intravenous contrast. Multiplanar CT image reconstructions and MIPs were obtained to evaluate the vascular anatomy. CONTRAST:  128mL OMNIPAQUE IOHEXOL 350 MG/ML SOLN COMPARISON:  February 25, 2019 FINDINGS: Cardiovascular: There is marked severity calcification of the aortic arch and descending thoracic aorta. Satisfactory opacification of the pulmonary arteries to the segmental level. No evidence of pulmonary embolism. Normal heart size. No pericardial effusion. Mediastinum/Nodes: A subcentimeter calcified right hilar lymph node is seen. Thyroid gland, trachea, and esophagus demonstrate no significant findings. Lungs/Pleura: There is marked severity emphysematous lung disease. An 8 mm calcified lung nodule is seen within the  medial aspect of the right lower lobe. There is no evidence of acute infiltrate, pleural effusion or pneumothorax. Upper Abdomen: There is a small hiatal hernia. Musculoskeletal: No chest wall abnormality. A metallic density fusion plate and screws are seen along the anterior aspect of the lower cervical spine. No acute or significant osseous findings. Review of the MIP images confirms the above findings. IMPRESSION: 1. No evidence of pulmonary embolus. 2. Marked severity emphysematous lung disease. 3. Small hiatal hernia. 4. Aortic atherosclerosis. Aortic Atherosclerosis (ICD10-I70.0) and Emphysema (ICD10-J43.9). Electronically Signed   By: Virgina Norfolk M.D.   On: 03/20/2020 00:04   CT Cervical Spine Wo Contrast  Result Date: 03/19/2020 CLINICAL DATA:  [place] EXAM: CT CERVICAL SPINE WITHOUT CONTRAST TECHNIQUE: Multidetector CT imaging of the cervical spine was performed without intravenous contrast. Multiplanar CT image reconstructions were also generated. COMPARISON:  10/03/2019 FINDINGS: Alignment: Alignment is grossly anatomic. Skull base and vertebrae: No acute fracture. No primary bone lesion or focal pathologic process. Soft tissues and spinal canal: No  prevertebral fluid or swelling. No visible canal hematoma. Disc levels: Previous anterior fusion spanning C3 through C7 is again noted. No evidence of metallic hardware failure or loosening. Stable spondylosis at C2-3 and C7-T1. Upper chest: Airway is patent. Emphysematous changes are seen at the lung apices. Other: Reconstructed images demonstrate no additional findings. IMPRESSION: 1. No acute cervical spine fracture. 2. Stable postsurgical changes from C3 through C7. 3. Stable spondylosis at C2-3 and C7-T1. Electronically Signed   By: Randa Ngo M.D.   On: 03/19/2020 21:55   CT ABDOMEN PELVIS W CONTRAST  Result Date: 03/20/2020 CLINICAL DATA:  Status post fall. EXAM: CT ABDOMEN AND PELVIS WITH CONTRAST TECHNIQUE: Multidetector CT imaging of the abdomen and pelvis was performed using the standard protocol following bolus administration of intravenous contrast. CONTRAST:  163mL OMNIPAQUE IOHEXOL 350 MG/ML SOLN COMPARISON:  October 03, 2019 FINDINGS: Lower chest: 8 mm calcified lung nodule is seen within the right lung base. Hepatobiliary: No focal liver abnormality is seen. No gallstones, gallbladder wall thickening, or biliary dilatation. Pancreas: Unremarkable. No pancreatic ductal dilatation or surrounding inflammatory changes. Spleen: Numerous punctate calcified granulomas are seen scattered throughout the spleen. Adrenals/Urinary Tract: Adrenal glands are unremarkable. Kidneys are normal in size, without renal calculi or hydronephrosis. A 0.8 cm cystic appearing areas seen within the lower pole of the right kidney. The urinary bladder is markedly distended. Stomach/Bowel: There is a small hiatal hernia. Appendix appears normal. No evidence of bowel wall thickening, distention, or inflammatory changes. Vascular/Lymphatic: Aortic atherosclerosis. No enlarged abdominal or pelvic lymph nodes. Reproductive: There is moderate to marked severity prostate gland enlargement. Other: No abdominal wall hernia or  abnormality. No abdominopelvic ascites. Musculoskeletal: Degenerative changes are seen throughout the lumbar spine. IMPRESSION: 1. Markedly distended urinary bladder. 2. Moderate to marked severity prostate gland enlargement. 3. Small hiatal hernia. 4. Aortic atherosclerosis. Aortic Atherosclerosis (ICD10-I70.0). Electronically Signed   By: Virgina Norfolk M.D.   On: 03/20/2020 00:13   DG Chest Portable 1 View  Result Date: 03/19/2020 CLINICAL DATA:  Chest pain for 3-4 weeks without shortness of breath or cough. EXAM: PORTABLE CHEST 1 VIEW COMPARISON:  October 03, 2019 FINDINGS: There is no evidence of acute infiltrate, pleural effusion or pneumothorax. The heart size and mediastinal contours are within normal limits. There is mild calcification of the aortic arch. A radiopaque fusion plate and screws are seen overlying the lower cervical spine. IMPRESSION: No active cardiopulmonary disease. Electronically Signed   By: Hoover Browns  [place] M.D.   On: 03/19/2020 22:08    EKG: Independently reviewed.  S.Tach at 101  Assessment/Plan Principal Problem:   Ataxia Active Problems:   Essential hypertension   DM type 2 (diabetes mellitus, type 2) ([place])   Acute urinary retention   ETOH abuse   Pulmonary embolism ([place])    1. Ataxia, AMS - 1. DDx includes (but not limited to), wernickie-korsakoff, stroke, EtOH withdrawal, other 2. High suspicion for wernicke / korsakoff - putting pt on empiric treatment dose thiamine at the moment 3. MRI brain pending 4. CIWA for possible withdrawals 5. Likely warrants neuro consult this AM after MRI resulted 2. HTN - 1. Cont home BP meds 3. Recent PE - 1. Cont Xarelto 4. DM2 - 1. Lantus 30u daily (takes 46 at home) 2. Mod scale SSI AC with 3u mealtime coverage 5. Acute urinary retention - 1. Foley in place 6. EtOH abuse - 1. CIWA 2. See above discussion 3. But not convinced his current symptoms are "just" due to withdrawals.  DVT prophylaxis: Xarelto Code  Status: Full Family Communication: No family in room Disposition Plan: Home after ataxia / AMS work up [employer] called: None Admission status: Place in [place] Hospitalists  How to contact the [facility] Attending or Consulting provider [place] or covering provider during after hours [place], for this patient?  1. Check the care team in [facility] and look for a) attending/consulting [facility] provider listed and b) the [facility] team listed 2. Log into [email or web address]  Amion Physician Scheduling and messaging for groups and whole hospitals  On call and physician scheduling software for group practices, residents, hospitalists and other medical providers for call, clinic, rotation and shift schedules. On[employer] is a hospital-wide system for scheduling doctors and paging doctors on call. EasyPlot is for scientific plotting and data analysis.  [email or web address]  and use [place]'s universal password to access. If you do not have the password, please contact the hospital operator.  3. Locate the [facility] provider you are looking for under [facility] and page to a number that you can be directly reached. 4. If you still have difficulty reaching the provider, please page the [facility] (Director on Call) for the Hospitalists listed on amion for assistance.  03/20/2020, 5:30 AM

## 2020-03-20 NOTE — Progress Notes (Signed)
PROGRESS NOTE    Terry Munoz  BWG:[id number] DOB: 01-24-1951 DOA: 03/19/2020 PCP: Richmond Campbell., PA-C   Brief Narrative:  Patient is 69 year old male with past medical history of hypertension, type 2 diabetes mellitus, hyperlipidemia, COPD, ethanol abuse, tobacco abuse presents to emergency department for evaluation of altered mental status and gait instability.  Patient reportedly quit drinking on 11/23.  He smokes 2 packs/day.  Upon arrival to ED: Patient tachycardic, tachypneic, blood pressure 157/80, blood alcohol level: WNL, UDS negative, sodium: 131, UA negative for infection CT chest, abdomen, pelvis, CT head and neck: Negative for acute findings.  Patient admitted for further evaluation and management of possible alcohol withdrawal  Assessment & Plan:   Acute metabolic encephalopathy -Patient presented with confusion and ataxia in the setting of alcohol abuse -Differential diagnosis including Wernicke's Korsakoff, ethanol withdrawal  -CT head/cervical spine, MRI brain negative for acute findings. -BAL: WNL, UDS negative, UA negative for infection, TSH, ammonia level: WNL.  Troponin x2 - -Patient started on multivitamin, thiamine, folic acid and CIWA protocol -On seizure/aspiration precautions -Consult PT/OT/SLP.  Check B12 and folate level -Neurochecks every 4 hour -Patient is at increased risk of morbidity and mortality-low threshold to transfer to ICU  History of PE: -CTA chest negative for PE.  Continue Xarelto  Type 2 diabetes mellitus: Uncontrolled.  A1c: 7.7%. -Continued Lantus 30 units daily and sliding scale insulin -Monitor blood pressure closely  Acute urinary retention: -Reviewed CT abdomen/pelvis shows markedly distended bladder -Foley in place.  Hypertension: Blood pressure elevated upon arrival -Continue ibesartan  Hyperlipidemia: Continue statin  GERD: Continue PPI  Tobacco abuse: Counseled about cessation.  Nicotine patch ordered  DVT  prophylaxis: Xarelto Code Status: Full code Family Communication:  None present at bedside.  Plan of care discussed with patient in length and he verbalized understanding and agreed with it. Disposition Plan: To be determined  Consultants:   None  Procedures:   CT head  CT cervical spine  MRI brain  CTA chest abdomen, pelvis  Antimicrobials:   none   Status is: Observation  Dispo: The patient is from: Home              Anticipated d/c is to: Home              Anticipated d/c date is: 2 days              Patient currently is not medically stable to d/c.   Subjective: Patient seen and examined in ED.  Sitting comfortably on the bed.  Alert and oriented x3.  Denies headache, chest pain, nausea, vomiting, abdominal pain, lightheadedness or dizziness.  Objective: Vitals:   03/20/20 1000 03/20/20 1030 03/20/20 1100 03/20/20 1130  BP: 137/86 127/77 (!) 150/71 128/65  Pulse: (!) 105 94 93 (!) 43  Resp:   18 18  Temp:      TempSrc:      SpO2: 99% 100% 99% 100%    Intake/Output Summary (Last 24 hours) at 03/20/2020 1146 Last data filed at 03/20/2020 0843 Gross per 24 hour  Intake 50 ml  Output 600 ml  Net -550 ml   There were no vitals filed for this visit.  Examination:  General exam: Appears calm and comfortable, on room air, elderly, following commands Respiratory system: Clear to auscultation. Respiratory effort normal. Cardiovascular system: S1 & S2 heard, RRR. No JVD, murmurs, rubs, gallops or clicks. No pedal edema. Gastrointestinal system: Abdomen is nondistended, soft and nontender. No organomegaly or masses felt.  Normal bowel sounds heard. Central nervous system: Alert and oriented. No focal neurological deficits. Extremities: Symmetric 5 x 5 power. Skin: No rashes, lesions or ulcers Psychiatry: Judgement and insight appear normal. Mood & affect appropriate.    Data Reviewed: I have personally reviewed following labs and imaging  studies  CBC: Recent Labs  Lab 03/19/20 2119  WBC 7.0  NEUTROABS 5.3  HGB 14.9  HCT 42.2  MCV 96.3  PLT 102*   Basic Metabolic Panel: Recent Labs  Lab 03/19/20 2119 03/20/20 0443  NA 131*  --   K 4.1  --   CL 94*  --   CO2 25  --   GLUCOSE 123*  --   BUN 16  --   CREATININE 1.28*  --   CALCIUM 8.9  --   MG  --  1.7  PHOS  --  3.2   GFR: CrCl cannot be calculated (Unknown ideal weight.). Liver Function Tests: Recent Labs  Lab 03/19/20 2119  AST 35  ALT 28  ALKPHOS 65  BILITOT 1.2  PROT 7.0  ALBUMIN 3.3*   Recent Labs  Lab 03/19/20 2119  LIPASE 21   Recent Labs  Lab 03/20/20 0443  AMMONIA 22   Coagulation Profile: No results for input(s): INR, PROTIME in the last 168 hours. Cardiac Enzymes: No results for input(s): CKTOTAL, CKMB, CKMBINDEX, TROPONINI in the last 168 hours. BNP (last 3 results) No results for input(s): PROBNP in the last 8760 hours. HbA1C: Recent Labs    03/20/20 0632  HGBA1C 7.7*   CBG: Recent Labs  Lab 03/20/20 0837 03/20/20 1004  GLUCAP 60* 157*   Lipid Profile: No results for input(s): CHOL, HDL, LDLCALC, TRIG, CHOLHDL, LDLDIRECT in the last 72 hours. Thyroid Function Tests: Recent Labs    03/19/20 2119 03/19/20 2129  TSH  --  4.110  FREET4 0.90  --    Anemia Panel: No results for input(s): VITAMINB12, FOLATE, FERRITIN, TIBC, IRON, RETICCTPCT in the last 72 hours. Sepsis Labs: No results for input(s): PROCALCITON, LATICACIDVEN in the last 168 hours.  Recent Results (from the past 240 hour(s))  Resp Panel by RT-PCR (Flu A&B, Covid) Nasopharyngeal Swab     Status: None   Collection Time: 03/19/20  9:19 PM   Specimen: Nasopharyngeal Swab; Nasopharyngeal(NP) swabs in vial transport medium  Result Value Ref Range Status   SARS Coronavirus 2 by RT PCR NEGATIVE NEGATIVE Final    Comment: (NOTE) SARS-CoV-2 target nucleic acids are NOT DETECTED.  The SARS-CoV-2 RNA is generally detectable in upper  respiratory specimens during the acute phase of infection. The lowest concentration of SARS-CoV-2 viral copies this assay can detect is 138 copies/mL. A negative result does not preclude SARS-Cov-2 infection and should not be used as the sole basis for treatment or other patient management decisions. A negative result may occur with  improper specimen collection/handling, submission of specimen other than nasopharyngeal swab, presence of viral mutation(s) within the areas targeted by this assay, and inadequate number of viral copies(<138 copies/mL). A negative result must be combined with clinical observations, patient history, and epidemiological information. The expected result is Negative.  Fact Sheet for Patients:  EntrepreneurPulse.com.au  Fact Sheet for Healthcare Providers:  IncredibleEmployment.be  This test is no t yet approved or cleared by the [place] FDA and  has been authorized for detection and/or diagnosis of SARS-CoV-2 by FDA under an Emergency Use Authorization (EUA). This EUA will remain  in effect (meaning this test can be used) for the  duration of the COVID-19 declaration under Section 564(b)(1) of the Act, 21 U.S.C.section 360bbb-3(b)(1), unless the authorization is terminated  or revoked sooner.       Influenza A by PCR NEGATIVE NEGATIVE Final   Influenza B by PCR NEGATIVE NEGATIVE Final    Comment: (NOTE) The Xpert Xpress SARS-CoV-2/FLU/RSV plus assay is intended as an aid in the diagnosis of influenza from Nasopharyngeal swab specimens and should not be used as a sole basis for treatment. Nasal washings and aspirates are unacceptable for Xpert Xpress SARS-CoV-2/FLU/RSV testing.  Fact Sheet for Patients: EntrepreneurPulse.com.au  Fact Sheet for Healthcare Providers: IncredibleEmployment.be  This test is not yet approved or cleared by the [place] FDA and has been  authorized for detection and/or diagnosis of SARS-CoV-2 by FDA under an Emergency Use Authorization (EUA). This EUA will remain in effect (meaning this test can be used) for the duration of the COVID-19 declaration under Section 564(b)(1) of the Act, 21 U.S.C. section 360bbb-3(b)(1), unless the authorization is terminated or revoked.  Performed at [facility], [place] [address], [place], [place] [address]       Radiology Studies: CT Head Wo Contrast  Result Date: 03/19/2020 CLINICAL DATA:  Right upper quadrant pain, history of alcohol abuse, fell EXAM: CT HEAD WITHOUT CONTRAST TECHNIQUE: Contiguous axial images were obtained from the base of the skull through the vertex without intravenous contrast. COMPARISON:  10/03/2019 FINDINGS: Brain: Scattered hypodensities throughout the periventricular white matter are compatible with chronic small vessel ischemic changes. No signs of acute infarct or hemorrhage. Lateral ventricles and midline structures are unremarkable. No acute extra-axial fluid collections. No mass effect. Vascular: No hyperdense vessel or unexpected calcification. Skull: Normal. Negative for fracture or focal lesion. Sinuses/Orbits: No acute finding. Other: None. IMPRESSION: 1. No acute intracranial process. Electronically Signed   By: Randa Ngo M.D.   On: 03/19/2020 21:53   CT Angio Chest PE W and/or Wo Contrast  Result Date: 03/20/2020 CLINICAL DATA:  Status post fall. EXAM: CT ANGIOGRAPHY CHEST WITH CONTRAST TECHNIQUE: Multidetector CT imaging of the chest was performed using the standard protocol during bolus administration of intravenous contrast. Multiplanar CT image reconstructions and MIPs were obtained to evaluate the vascular anatomy. CONTRAST:  183mL OMNIPAQUE IOHEXOL 350 MG/ML SOLN COMPARISON:  February 25, 2019 FINDINGS: Cardiovascular: There is marked severity calcification of the aortic arch and descending thoracic aorta. Satisfactory opacification of the  pulmonary arteries to the segmental level. No evidence of pulmonary embolism. Normal heart size. No pericardial effusion. Mediastinum/Nodes: A subcentimeter calcified right hilar lymph node is seen. Thyroid gland, trachea, and esophagus demonstrate no significant findings. Lungs/Pleura: There is marked severity emphysematous lung disease. An 8 mm calcified lung nodule is seen within the medial aspect of the right lower lobe. There is no evidence of acute infiltrate, pleural effusion or pneumothorax. Upper Abdomen: There is a small hiatal hernia. Musculoskeletal: No chest wall abnormality. A metallic density fusion plate and screws are seen along the anterior aspect of the lower cervical spine. No acute or significant osseous findings. Review of the MIP images confirms the above findings. IMPRESSION: 1. No evidence of pulmonary embolus. 2. Marked severity emphysematous lung disease. 3. Small hiatal hernia. 4. Aortic atherosclerosis. Aortic Atherosclerosis (ICD10-I70.0) and Emphysema (ICD10-J43.9). Electronically Signed   By: Virgina Norfolk M.D.   On: 03/20/2020 00:04   CT Cervical Spine Wo Contrast  Result Date: 03/19/2020 CLINICAL DATA:  [place] EXAM: CT CERVICAL SPINE WITHOUT CONTRAST TECHNIQUE: Multidetector CT imaging of the cervical spine  was performed without intravenous contrast. Multiplanar CT image reconstructions were also generated. COMPARISON:  10/03/2019 FINDINGS: Alignment: Alignment is grossly anatomic. Skull base and vertebrae: No acute fracture. No primary bone lesion or focal pathologic process. Soft tissues and spinal canal: No prevertebral fluid or swelling. No visible canal hematoma. Disc levels: Previous anterior fusion spanning C3 through C7 is again noted. No evidence of metallic hardware failure or loosening. Stable spondylosis at C2-3 and C7-T1. Upper chest: Airway is patent. Emphysematous changes are seen at the lung apices. Other: Reconstructed images demonstrate no additional  findings. IMPRESSION: 1. No acute cervical spine fracture. 2. Stable postsurgical changes from C3 through C7. 3. Stable spondylosis at C2-3 and C7-T1. Electronically Signed   By: Sharlet Salina M.D.   On: 03/19/2020 21:55   MR BRAIN WO CONTRAST  Result Date: 03/20/2020 CLINICAL DATA:  Initial evaluation for acute neuro deficit, stroke suspected. EXAM: MRI HEAD WITHOUT CONTRAST TECHNIQUE: Multiplanar, multiecho pulse sequences of the brain and surrounding structures were obtained without intravenous contrast. COMPARISON:  Prior head CT from 03/19/2020. FINDINGS: Brain: Generalized age-related cerebral atrophy. Patchy and confluent T2/FLAIR hyperintensity within the periventricular deep white matter both cerebral hemispheres, most consistent with chronic small vessel ischemic disease. White matter. Few additional small remote infarcts noted about the cerebellum. Scattered remote lacunar infarcts noted about the periventricular No abnormal foci of restricted diffusion to suggest acute or subacute ischemia. Gray-white matter differentiation maintained. No encephalomalacia to suggest chronic cortical infarction. No foci of susceptibility artifact to suggest acute or chronic intracranial hemorrhage. No mass lesion, midline shift or mass effect. No hydrocephalus or extra-axial fluid collection. Pituitary gland suprasellar region normal. Midline structures intact. Vascular: Major intracranial vascular flow voids are maintained. Skull and upper cervical spine: Craniocervical junction within normal limits. Postsurgical changes partially visualize within the upper cervical spine. Bone marrow signal intensity within normal limits. No scalp soft tissue abnormality. Sinuses/Orbits: Globes and orbital soft tissues within normal limits. Paranasal sinuses are clear. Small bilateral mastoid effusions noted, of doubtful significance. Inner ear structures grossly normal. Visualized nasopharynx unremarkable. Other: None.  IMPRESSION: 1. No acute intracranial infarct or other abnormality. 2. Age-related cerebral atrophy with chronic small vessel ischemic disease, with a few scattered remote lacunar infarcts involving the periventricular white matter and cerebellum. Electronically Signed   By: Rise Mu M.D.   On: 03/20/2020 05:57   CT ABDOMEN PELVIS W CONTRAST  Result Date: 03/20/2020 CLINICAL DATA:  Status post fall. EXAM: CT ABDOMEN AND PELVIS WITH CONTRAST TECHNIQUE: Multidetector CT imaging of the abdomen and pelvis was performed using the standard protocol following bolus administration of intravenous contrast. CONTRAST:  OMNIPAQUE IOHEXOL 350 MG/ML SOLN COMPARISON:  October 03, 2019 FINDINGS: Lower chest: 8 mm calcified lung nodule is seen within the right lung base. Hepatobiliary: No focal liver abnormality is seen. No gallstones, gallbladder wall thickening, or biliary dilatation. Pancreas: Unremarkable. No pancreatic ductal dilatation or surrounding inflammatory changes. Spleen: Numerous punctate calcified granulomas are seen scattered throughout the spleen. Adrenals/Urinary Tract: Adrenal glands are unremarkable. Kidneys are normal in size, without renal calculi or hydronephrosis. A 0.8 cm cystic appearing areas seen within the lower pole of the right kidney. The urinary bladder is markedly distended. Stomach/Bowel: There is a small hiatal hernia. Appendix appears normal. No evidence of bowel wall thickening, distention, or inflammatory changes. Vascular/Lymphatic: Aortic atherosclerosis. No enlarged abdominal or pelvic lymph nodes. Reproductive: There is moderate to marked severity prostate gland enlargement. Other: No abdominal wall hernia or abnormality. No abdominopelvic ascites.  Musculoskeletal: Degenerative changes are seen throughout the lumbar spine. IMPRESSION: 1. Markedly distended urinary bladder. 2. Moderate to marked severity prostate gland enlargement. 3. Small hiatal hernia. 4. Aortic  atherosclerosis. Aortic Atherosclerosis (ICD10-I70.0). Electronically Signed   By: Aram Candela M.D.   On: 03/20/2020 00:13   DG Chest Portable 1 View  Result Date: 03/19/2020 CLINICAL DATA:  Chest pain for 3-4 weeks without shortness of breath or cough. EXAM: PORTABLE CHEST 1 VIEW COMPARISON:  October 03, 2019 FINDINGS: There is no evidence of acute infiltrate, pleural effusion or pneumothorax. The heart size and mediastinal contours are within normal limits. There is mild calcification of the aortic arch. A radiopaque fusion plate and screws are seen overlying the lower cervical spine. IMPRESSION: No active cardiopulmonary disease. Electronically Signed   By: Aram Candela M.D.   On: 03/19/2020 22:08    Scheduled Meds: . amitriptyline  50 mg Oral QHS  . atorvastatin  40 mg Oral QHS  . Empagliflozin-linaGLIPtin  1 tablet Oral QHS  . folic acid  1 mg Oral Daily  . insulin aspart  0-15 Units Subcutaneous TID WC  . insulin aspart  3 Units Subcutaneous TID WC  . insulin glargine  30 Units Subcutaneous Daily  . irbesartan  75 mg Oral QHS  . mometasone-formoterol  2 puff Inhalation BID  . multivitamin with minerals  1 tablet Oral Daily  . nicotine  14 mg Transdermal Daily  . pantoprazole  40 mg Oral Daily  . Rivaroxaban  15 mg Oral BID WC  . [START ON 04/05/2020] rivaroxaban  20 mg Oral Q supper   Continuous Infusions: . [START ON 03/22/2020] thiamine injection    . thiamine injection Stopped (03/20/20 0843)     LOS: 0 days   Time spent: 40 minutes   Fahmida Jurich Estill Cotta, MD [facility]  If 7PM-7AM, please contact night-coverage [email or web address] 03/20/2020, 11:46 AM

## 2020-03-20 NOTE — ED Notes (Signed)
Attempted to give report and was told the nurse will call back

## 2020-03-20 NOTE — ED Notes (Signed)
Pt provided w lunch tray.

## 2020-03-20 NOTE — ED Notes (Signed)
Pt had a BM in the middle of the floor.

## 2020-03-20 NOTE — ED Notes (Signed)
Pt provided with breakfast tray.

## 2020-03-20 NOTE — ED Notes (Signed)
Attempted to give report and was told nurse is at lunch and she'll call me back.

## 2020-03-20 NOTE — Evaluation (Signed)
Physical Therapy Evaluation & Discharge Patient Details Name: Terry Munoz MRN: [id number] DOB: [birth year] Today's Date: 03/20/2020   History of Present Illness  Pt is a 69 y.o. male admitted 03/19/20 with AMS, unstable gait and fall; workup for acute metabolic encephalopathy. 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 Of note, pt reports he quit drinking 12/23. PMH includes HTN, DM, COPD, PVD, ETOH abuse, tobacco abuse.    Clinical Impression  Patient evaluated by Physical Therapy with no further acute PT needs identified. 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 Today, pt moving well at supervision-level; stability and cognition much improved since events leading to admission. Wife present and supportive. All education has been completed and the patient has no further questions. Acute PT is signing off. Thank you for this referral.    Follow Up Recommendations No PT follow up;Supervision - Intermittent    Equipment Recommendations  None recommended by PT    Recommendations for Other Services       Precautions / Restrictions Precautions Precautions: Fall Restrictions Weight Bearing Restrictions: No      Mobility  Bed Mobility Overal bed mobility: Independent                  Transfers Overall transfer level: Needs assistance Equipment used: None Transfers: Sit to/from Stand Sit to Stand: Supervision            Ambulation/Gait Ambulation/Gait assistance: Supervision Gait Distance (Feet): 200 Feet Assistive device: None Gait Pattern/deviations: Step-through pattern;Decreased stride length Gait velocity: Decreased   General Gait Details: Slow, mostly steady gait without DME; supervision due to fall risk and safety with lines. No overt instability or LOB noted  Stairs            Wheelchair Mobility    Modified Rankin (Stroke Patients  Only) Modified Rankin (Stroke Patients Only) Pre-Morbid Rankin Score: No symptoms Modified Rankin: No significant disability     Balance Overall balance assessment: Needs assistance   Sitting balance-Leahy Scale: Good Sitting balance - Comments: Able to adjust socks sitting EOB     Standing balance-Leahy Scale: Good               High level balance activites: Backward walking;Side stepping;Direction changes;Turns;Sudden stops;Head turns High Level Balance Comments: No overt instability or LOB with higher level balance tasks             Pertinent Vitals/Pain Pain Assessment: No/denies pain    Home Living Family/patient expects to be discharged to:: Private residence Living Arrangements: Spouse/significant other Available Help at Discharge: Family;Available 24 hours/day Type of Home: House Home Access: Ramped entrance;Stairs to enter Entrance Stairs-Rails: Right;Left Entrance Stairs-Number of Steps: 6 Home Layout: One level Home Equipment: Walker - 2 wheels;Bedside commode;Shower seat;Wheelchair - manual      Prior Function Level of Independence: Independent         Comments: Still working on his farm, mostly farms hay     Hand Dominance        Extremity/Trunk Assessment   Upper Extremity Assessment Upper Extremity Assessment: Overall WFL for tasks assessed    Lower Extremity Assessment Lower Extremity Assessment: Overall WFL for tasks assessed       Communication   Communication: No difficulties  Cognition Arousal/Alertness: Awake/alert Behavior During Therapy: WFL for tasks assessed/performed Overall Cognitive Status: Within Functional Limits for tasks assessed  General Comments: Frustrated about indwelling catheter having to stay in due to bladder retention      General Comments General comments (skin integrity, edema, etc.): Wife present and supportive    Exercises     Assessment/Plan     PT Assessment Patent does not need any further PT services  PT Problem List         PT Treatment Interventions      PT Goals (Current goals can be found in the Care Plan section)  Acute Rehab PT Goals PT Goal Formulation: All assessment and education complete, DC therapy    Frequency     Barriers to discharge        Co-evaluation               AM-PAC PT "6 Clicks" Mobility  Outcome Measure Help needed turning from your back to your side while in a flat bed without using bedrails?: None Help needed moving from lying on your back to sitting on the side of a flat bed without using bedrails?: None Help needed moving to and from a bed to a chair (including a wheelchair)?: A Little Help needed standing up from a chair using your arms (e.g., wheelchair or bedside chair)?: A Little Help needed to walk in hospital room?: A Little Help needed climbing 3-5 steps with a railing? : A Little 6 Click Score: 20    End of Session Equipment Utilized During Treatment: Gait belt Activity Tolerance: Patient tolerated treatment well Patient left: in bed;with call bell/phone within reach;with bed alarm set;with family/visitor present Nurse Communication: Mobility status PT Visit Diagnosis: Other abnormalities of gait and mobility (R26.89)    Time: [id number] PT Time Calculation (min) (ACUTE ONLY): 17 min   Charges:   PT Evaluation $PT Eval Moderate Complexity: [place], PT, DPT Acute Rehabilitation Services  Pager [phone] Office [place] 03/20/2020, 4:04 PM

## 2020-03-21 DIAGNOSIS — R270 Ataxia, unspecified: Secondary | ICD-10-CM | POA: Diagnosis not present

## 2020-03-21 LAB — CBC
HCT: 40.5 % (ref 39.0–52.0)
Hemoglobin: 14.1 g/dL (ref 13.0–17.0)
MCH: 33.3 pg (ref 26.0–34.0)
MCHC: 34.8 g/dL (ref 30.0–36.0)
MCV: 95.5 fL (ref 80.0–100.0)
Platelets: 139 10*3/uL — ABNORMAL LOW (ref 150–400)
RBC: 4.24 MIL/uL (ref 4.22–5.81)
RDW: 12.8 % (ref 11.5–15.5)
WBC: 7 10*3/uL (ref 4.0–10.5)
nRBC: 0 % (ref 0.0–0.2)

## 2020-03-21 LAB — COMPREHENSIVE METABOLIC PANEL
ALT: 31 U/L (ref 0–44)
AST: 43 U/L — ABNORMAL HIGH (ref 15–41)
Albumin: 3 g/dL — ABNORMAL LOW (ref 3.5–5.0)
Alkaline Phosphatase: 54 U/L (ref 38–126)
Anion gap: 12 (ref 5–15)
BUN: 14 mg/dL (ref 8–23)
CO2: 24 mmol/L (ref 22–32)
Calcium: 8.6 mg/dL — ABNORMAL LOW (ref 8.9–10.3)
Chloride: 96 mmol/L — ABNORMAL LOW (ref 98–111)
Creatinine, Ser: 1.07 mg/dL (ref 0.61–1.24)
GFR, Estimated: >60 mL/min (ref >60)
Glucose, Bld: 76 mg/dL (ref 70–99)
Potassium: 3.4 mmol/L — ABNORMAL LOW (ref 3.5–5.1)
Sodium: 132 mmol/L — ABNORMAL LOW (ref 135–145)
Total Bilirubin: 0.8 mg/dL (ref 0.3–1.2)
Total Protein: 6.6 g/dL (ref 6.5–8.1)

## 2020-03-21 LAB — GLUCOSE, CAPILLARY
Glucose-Capillary: 91 mg/dL (ref 70–99)
Glucose-Capillary: 166 mg/dL — ABNORMAL HIGH (ref 70–99)

## 2020-03-21 LAB — MAGNESIUM: Magnesium: 2 mg/dL (ref 1.7–2.4)

## 2020-03-21 MED ORDER — POTASSIUM CHLORIDE 20 MEQ PO PACK
40.0000 meq | PACK | Freq: Once | ORAL | Status: AC
  Administered 2020-03-21: 10:00:00 40 meq via ORAL
  Filled 2020-03-21: qty 2

## 2020-03-21 MED ORDER — ADULT MULTIVITAMIN W/MINERALS CH: 1.0000 | ORAL_TABLET | Freq: Every day | ORAL | Status: AC

## 2020-03-21 MED ORDER — RIVAROXABAN 20 MG PO TABS: 20.0000 mg | ORAL_TABLET | Freq: Every day | ORAL | Status: DC

## 2020-03-21 MED ORDER — FOLIC ACID 1 MG PO TABS: 1.0000 mg | ORAL_TABLET | Freq: Every day | ORAL | 0 refills | Status: AC

## 2020-03-21 MED ORDER — RIVAROXABAN 20 MG PO TABS: 20.0000 mg | ORAL_TABLET | Freq: Every day | ORAL | 0 refills | Status: DC

## 2020-03-21 NOTE — Evaluation (Signed)
Clinical/Bedside Swallow Evaluation Patient Details  Name: Terry Munoz MRN: [id number] Date of Birth: [birth year]  Today's Date: 03/21/2020 Time: SLP Start Time (ACUTE ONLY): 0950 SLP Stop Time (ACUTE ONLY): 0957 SLP Time Calculation (min) (ACUTE ONLY): 7 min  Past Medical History:  Past Medical History:  Diagnosis Date  . COPD (chronic obstructive pulmonary disease) ([place])   . 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 Hyperlipidemia 10/04/2019  . Hypertension   . Hyponatremia 03/2014  . Insomnia   . Kidney stones   . Mouth ulcers   . Osteoarthritis   . Otitis media   . Peripheral vascular disease ([place])   . Sinusitis    Past Surgical History:  Past Surgical History:  Procedure Laterality Date  . DENTAL SURGERY    . FEMORAL-POPLITEAL BYPASS GRAFT Right 07/24/2015   Procedure: Thrombectomy Right Femoral-Popliteal Artery; Thrombectomy Right Anterior-Tibial Artery, Thrombectomy Right Tibial-Peroneal Trunk; Bovine Patch Angioplasty Right Popliteal Artery; Endarterectomy Right Anterior Tibial Artery and Right Tibial-Peroneal Trunk; Right Leg Angiogram, Four Compartment Fasciotomy Right Lower Leg;  Surgeon: Conrad [place], MD;  Location: [facility];  Service: Vascular;  L  . HERNIA REPAIR    . lithotomy    . LOWER EXTREMITY ANGIOGRAM N/A 05/01/2014   Procedure: LOWER EXTREMITY ANGIOGRAM;  Surgeon: Laverda Page, MD;  Location: [facility] CATH LAB;  Service: Cardiovascular;  Laterality: N/A;  . NECK SURGERY    . POPLITEAL ARTERY ANGIOPLASTY Right 05/02/2014   to mid sfa  . THROMBECTOMY  05/02/2014   HPI:  Pt is a 69 y.o. male admitted 03/19/20 with AMS, unstable gait and fall; workup for acute metabolic encephalopathy. 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 Of note, pt reports he quit drinking 12/23. PMH includes HTN, DM, COPD, PVD, ETOH abuse, tobacco abuse.   Assessment /  Plan / Recommendation Clinical Impression  Pt's oral-motor abilities are WFL's and states he wears lower dentures when eating that are not present. Reported consuming bacon, eggs this morning for breakfast without difficulty. He typically takes meds in applesauce (wife corroberated) but today took approximately 5 (one large) with water and immediate throat clear. Educated to continue using applesauce or take one at a time with water. No further ST needed. SLP Visit Diagnosis: Dysphagia, unspecified (R13.10)    Aspiration Risk  Mild aspiration risk    Diet Recommendation Regular;Thin liquid   Liquid Administration via: Cup;Straw Medication Administration: Whole meds with liquid Supervision: Patient able to self feed Postural Changes: Seated upright at 90 degrees    Other  Recommendations Oral Care Recommendations: Oral care BID   Follow up Recommendations None      Frequency and Duration            Prognosis        Swallow Study   General HPI: Pt is a 69 y.o. male admitted 03/19/20 with AMS, unstable gait and fall; workup for acute metabolic encephalopathy. 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 Of note, pt reports he quit drinking 12/23. PMH includes HTN, DM, COPD, PVD, ETOH abuse, tobacco abuse. Type of Study: Bedside Swallow Evaluation Previous Swallow Assessment:  (none) Diet Prior to this Study: Regular;Thin liquids Temperature Spikes Noted: No Respiratory Status: Room air History of Recent Intubation: No Behavior/Cognition: Alert;Cooperative;Pleasant mood Oral Cavity Assessment: Within Functional Limits Oral Care Completed by SLP: No Oral Cavity - Dentition: Dentures,  bottom;Dentures, not available (upper dentition) Vision: Functional for self-feeding Self-Feeding Abilities: Able to feed self Patient Positioning: Upright in bed Baseline Vocal Quality: Normal Volitional  Cough: Strong Volitional Swallow: Able to elicit    Oral/Motor/Sensory Function Overall Oral Motor/Sensory Function: Within functional limits   Ice Chips Ice chips: Not tested   Thin Liquid Thin Liquid: Impaired Presentation: Cup Pharyngeal  Phase Impairments: Throat Clearing - Immediate    Nectar Thick Nectar Thick Liquid: Not tested   Honey Thick Honey Thick Liquid: Not tested   Puree Puree: Not tested   Solid     Solid: Not tested      Houston Siren 03/21/2020,10:11 AM   Orbie Pyo Colvin Caroli.Ed Risk analyst [phone] Office [phone]

## 2020-03-21 NOTE — Plan of Care (Signed)

## 2020-03-21 NOTE — Evaluation (Deleted)
Clinical/Bedside Swallow Evaluation Patient Details  Name: Terry Munoz MRN: [id number] Date of Birth: [birth year]  Today's Date: 03/21/2020 Time: SLP Start Time (ACUTE ONLY): 0950 SLP Stop Time (ACUTE ONLY): 0957 SLP Time Calculation (min) (ACUTE ONLY): 7 min  Past Medical History:  Past Medical History:  Diagnosis Date  . COPD (chronic obstructive pulmonary disease) ([place])   . 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 Hyperlipidemia 10/04/2019  . Hypertension   . Hyponatremia 03/2014  . Insomnia   . Kidney stones   . Mouth ulcers   . Osteoarthritis   . Otitis media   . Peripheral vascular disease ([place])   . Sinusitis    Past Surgical History:  Past Surgical History:  Procedure Laterality Date  . DENTAL SURGERY    . FEMORAL-POPLITEAL BYPASS GRAFT Right 07/24/2015   Procedure: Thrombectomy Right Femoral-Popliteal Artery; Thrombectomy Right Anterior-Tibial Artery, Thrombectomy Right Tibial-Peroneal Trunk; Bovine Patch Angioplasty Right Popliteal Artery; Endarterectomy Right Anterior Tibial Artery and Right Tibial-Peroneal Trunk; Right Leg Angiogram, Four Compartment Fasciotomy Right Lower Leg;  Surgeon: Conrad [place], MD;  Location: [place];  Service: Vascular;  L  . HERNIA REPAIR    . lithotomy    . LOWER EXTREMITY ANGIOGRAM N/A 05/01/2014   Procedure: LOWER EXTREMITY ANGIOGRAM;  Surgeon: Laverda Page, MD;  Location: [facility] CATH LAB;  Service: Cardiovascular;  Laterality: N/A;  . NECK SURGERY    . POPLITEAL ARTERY ANGIOPLASTY Right 05/02/2014   to mid sfa  . THROMBECTOMY  05/02/2014   HPI:  Pt is a 69 y.o. male admitted 03/19/20 with AMS, unstable gait and fall; workup for acute metabolic encephalopathy. 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 Of note, pt reports he quit drinking 12/23. PMH includes HTN, DM, COPD, PVD, ETOH abuse, tobacco abuse.   Assessment /  Plan / Recommendation Clinical Impression  Pt's oral-motor abilities are WFL's and states he wears lower dentures when eating that are not present. Reported consuming bacon, eggs this morning for breakfast without difficulty. He typically takes meds in applesauce (wife corroberated) but today took approximately 5 (one large) with water and immediate throat clear. Educated to continue using applesauce or take one at a time with water. No further ST needed. SLP Visit Diagnosis: Dysphagia, unspecified (R13.10)    Aspiration Risk  Mild aspiration risk    Diet Recommendation Regular;Thin liquid   Liquid Administration via: Cup;Straw Medication Administration: Whole meds with liquid Supervision: Patient able to self feed Postural Changes: Seated upright at 90 degrees    Other  Recommendations Oral Care Recommendations: Oral care BID   Follow up Recommendations None      Frequency and Duration            Prognosis        Swallow Study   General HPI: Pt is a 69 y.o. male admitted 03/19/20 with AMS, unstable gait and fall; workup for acute metabolic encephalopathy. 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 Of note, pt reports he quit drinking 12/23. PMH includes HTN, DM, COPD, PVD, ETOH abuse, tobacco abuse. Type of Study: Bedside Swallow Evaluation Previous Swallow Assessment:  (none) Diet Prior to this Study: Regular;Thin liquids Temperature Spikes Noted: No Respiratory Status: Room air History of Recent Intubation: No Behavior/Cognition: Alert;Cooperative;Pleasant mood Oral Cavity Assessment: Within Functional Limits Oral Care Completed by SLP: No Oral Cavity - Dentition: Dentures,  bottom;Dentures, not available (upper dentition) Vision: Functional for self-feeding Self-Feeding Abilities: Able to feed self Patient Positioning: Upright in bed Baseline Vocal Quality: Normal Volitional  Cough: Strong Volitional Swallow: Able to elicit    Oral/Motor/Sensory Function Overall Oral Motor/Sensory Function: Within functional limits   Ice Chips Ice chips: Not tested   Thin Liquid Thin Liquid: Impaired Presentation: Cup Pharyngeal  Phase Impairments: Throat Clearing - Immediate    Nectar Thick Nectar Thick Liquid: Not tested   Honey Thick Honey Thick Liquid: Not tested   Puree Puree: Not tested   Solid     Solid: Not tested      Terry Munoz 03/21/2020,10:11 AM   Terry Munoz Risk analyst [phone] Office [phone]

## 2020-03-21 NOTE — Discharge Summary (Signed)
Physician Discharge Summary  TILTON CATA [id number] DOB: 12-07-1950 DOA: 03/19/2020  PCP: Aletha Halim., PA-C  Admit date: 03/19/2020 Discharge date: 03/21/2020  Admitted From: Home Disposition: Home  Recommendations for Outpatient Follow-up:  Follow-up with PCP in 1 week Repeat CBC and BMP and magnesium level on follow-up visit Avoid alcohol and cigarette smoking Take Xarelto 20 mg daily  Home Health: None Equipment/Devices: None Discharge Condition: Stable CODE STATUS: Full code Diet recommendation: Carb consistent/heart healthy diet  Brief/Interim Summary: Patient is 69 year old male with past medical history of hypertension, type 2 diabetes mellitus, hyperlipidemia, COPD, ethanol abuse, tobacco abuse presents to emergency department for evaluation of altered mental status and gait instability.  Patient reportedly quit drinking on 11/23.  He smokes 2 packs/day.  Upon arrival to ED: Patient tachycardic, tachypneic, blood pressure 157/80, blood alcohol level: WNL, UDS negative, sodium: 131, UA negative for infection CT chest, abdomen, pelvis, CT head and neck: Negative for acute findings.  Patient admitted for further evaluation and management of possible alcohol withdrawal  Acute metabolic encephalopathy -Patient presented with confusion and ataxia in the setting of alcohol abuse -CT head/cervical spine, MRI brain negative for acute findings. -BAL: WNL, UDS negative, UA negative for infection, TSH, ammonia level: WNL.  Troponin x2 -B12, folate level: WNL -Patient started on multivitamin, thiamine, folic acid and CIWA protocol -On seizure/aspiration precautions -Consulted PT/OT recommended no follow-up -Evaluated by SLP recommended regular diet/thin liquids -CIWA score 0, patient did not receive Ativan in last 12 hours.  He is alert and oriented x3 and wishes to go home. -Discharged on multivitamin, folic acid and thiamine.  Counseled about alcohol cessation and he  verbalized understanding.    History of PE: -Diagnosed in 02/25/2020. -CTA chest during this admission negative for PE.    He finished initial loading dose of Xarelto 15 mg twice daily for 21 days.  Continued Xarelto 20 mg daily.  -Recommend to follow-up with PCP to discuss about duration of Xarelto.  Type 2 diabetes mellitus: Uncontrolled.  A1c: 7.7%. -Continued Lantus 30 units daily and sliding scale insulin -Nute home regimen of Lantus and NovoLog at the time of discharge  Acute urinary retention: -Reviewed CT abdomen/pelvis shows markedly distended bladder -Foley in place.   -12/26-Foley was clamped-patient was able to void without any issues.  Foley DC'd.  Hypertension: Blood pressure elevated upon arrival.  His blood pressure improved. -Continued ibesartan  Hyperlipidemia: Continued statin  GERD: Continued PPI  Thrombocytopenia: Platelet: 139.  Likely in the setting of alcohol abuse.  No signs of active bleeding.  Repeat CBC on follow-up visit  Hypokalemia: Potassium 3.4.  Replenished.  Repeat BMP on follow-up visit  Hyponatremia: Sodium 132.  Patient remained asymptomatic.  Repeat BMP on follow-up visit with PCP.  Tobacco abuse: Counseled about cessation.  Nicotine patch ordered  Discharge Diagnoses:  Acute metabolic encephalopathy in the setting of alcohol abuse History of PE Type 2 diabetes mellitus Acute urinary retention Hypertension Hyperlipidemia GERD Thrombocytopenia Hypokalemia Hyponatremia Tobacco abuse  Discharge Instructions  Discharge Instructions    Diet - low sodium heart healthy   Complete by: As directed    Discharge instructions   Complete by: As directed    Follow-up with PCP in 1 week Repeat CBC, BMP and magnesium level on follow-up visit Avoid alcohol and cigarette smoking Take Xarelto 20 mg once daily   Increase activity slowly   Complete by: As directed      Allergies as of 03/21/2020      Reactions  Sulfa Antibiotics Hives       Medication List    TAKE these medications   Advair Diskus 500-50 MCG/DOSE Aepb Generic drug: Fluticasone-Salmeterol Inhale 1 puff into the lungs 2 (two) times daily.   albuterol 108 (90 Base) MCG/ACT inhaler Commonly known as: VENTOLIN HFA Inhale 1-2 puffs into the lungs every 6 (six) hours as needed for wheezing or shortness of breath.   albuterol (2.5 MG/3ML) 0.083% nebulizer solution Commonly known as: PROVENTIL Take 2.5 mg by nebulization every 6 (six) hours as needed for wheezing or shortness of breath.   amitriptyline 50 MG tablet Commonly known as: ELAVIL Take 50 mg by mouth at bedtime.   Aquaphilic Oint Apply 1 application topically See admin instructions. Apply with efudex as needed   atorvastatin 40 MG tablet Commonly known as: LIPITOR TAKE 1 TABLET DAILY What changed: when to take this   budesonide-formoterol 160-4.5 MCG/ACT inhaler Commonly known as: SYMBICORT Inhale 2 puffs into the lungs daily as needed (for shortness of breath).   fluorouracil 5 % cream Commonly known as: EFUDEX Apply 1 application topically daily as needed (rash).   folic acid 1 MG tablet Commonly known as: FOLVITE Take 1 tablet (1 mg total) by mouth daily. Start taking on: March 22, 2020   Glyxambi 10-5 MG Tabs Generic drug: Empagliflozin-linaGLIPtin Take 1 tablet by mouth at bedtime.   insulin glargine 100 UNIT/ML injection Commonly known as: LANTUS Inject 46 Units into the skin daily after breakfast.   insulin lispro 100 UNIT/ML injection Commonly known as: HUMALOG Inject 3 Units into the skin 3 (three) times daily after meals.   irbesartan 75 MG tablet Commonly known as: AVAPRO Take 1 tablet (75 mg total) by mouth daily. What changed: when to take this   multivitamin with minerals Tabs tablet Take 1 tablet by mouth daily. Start taking on: March 22, 2020   omeprazole 20 MG capsule Commonly known as: PRILOSEC Take 20 mg by mouth daily as needed  (heartburn).   rivaroxaban 20 MG Tabs tablet Commonly known as: XARELTO Take 1 tablet (20 mg total) by mouth daily with supper. What changed:   medication strength  how much to take  when to take this   thiamine 50 MG tablet Commonly known as: VITAMIN B-1 Take 150 mg by mouth daily.       Allergies  Allergen Reactions  . Sulfa Antibiotics Hives    Consultations:  None   Procedures/Studies: CT Head Wo Contrast  Result Date: 03/19/2020 CLINICAL DATA:  Right upper quadrant pain, history of alcohol abuse, fell EXAM: CT HEAD WITHOUT CONTRAST TECHNIQUE: Contiguous axial images were obtained from the base of the skull through the vertex without intravenous contrast. COMPARISON:  10/03/2019 FINDINGS: Brain: Scattered hypodensities throughout the periventricular white matter are compatible with chronic small vessel ischemic changes. No signs of acute infarct or hemorrhage. Lateral ventricles and midline structures are unremarkable. No acute extra-axial fluid collections. No mass effect. Vascular: No hyperdense vessel or unexpected calcification. Skull: Normal. Negative for fracture or focal lesion. Sinuses/Orbits: No acute finding. Other: None. IMPRESSION: 1. No acute intracranial process. Electronically Signed   By: Randa Ngo M.D.   On: 03/19/2020 21:53   CT Angio Chest PE W and/or Wo Contrast  Result Date: 03/20/2020 CLINICAL DATA:  Status post fall. EXAM: CT ANGIOGRAPHY CHEST WITH CONTRAST TECHNIQUE: Multidetector CT imaging of the chest was performed using the standard protocol during bolus administration of intravenous contrast. Multiplanar CT image reconstructions and MIPs were obtained to evaluate  the vascular anatomy. CONTRAST:  155mL OMNIPAQUE IOHEXOL 350 MG/ML SOLN COMPARISON:  February 25, 2019 FINDINGS: Cardiovascular: There is marked severity calcification of the aortic arch and descending thoracic aorta. Satisfactory opacification of the pulmonary arteries to the  segmental level. No evidence of pulmonary embolism. Normal heart size. No pericardial effusion. Mediastinum/Nodes: A subcentimeter calcified right hilar lymph node is seen. Thyroid gland, trachea, and esophagus demonstrate no significant findings. Lungs/Pleura: There is marked severity emphysematous lung disease. An 8 mm calcified lung nodule is seen within the medial aspect of the right lower lobe. There is no evidence of acute infiltrate, pleural effusion or pneumothorax. Upper Abdomen: There is a small hiatal hernia. Musculoskeletal: No chest wall abnormality. A metallic density fusion plate and screws are seen along the anterior aspect of the lower cervical spine. No acute or significant osseous findings. Review of the MIP images confirms the above findings. IMPRESSION: 1. No evidence of pulmonary embolus. 2. Marked severity emphysematous lung disease. 3. Small hiatal hernia. 4. Aortic atherosclerosis. Aortic Atherosclerosis (ICD10-I70.0) and Emphysema (ICD10-J43.9). Electronically Signed   By: Virgina Norfolk M.D.   On: 03/20/2020 00:04   CT ANGIO CHEST PE W OR WO CONTRAST  Result Date: 02/25/2020 CLINICAL DATA:  Elevated D-dimer. Dyspnea. Chest pressure. Cough. Current smoker. Follow-up pulmonary nodule. EXAM: CT ANGIOGRAPHY CHEST WITH CONTRAST TECHNIQUE: Multidetector CT imaging of the chest was performed using the standard protocol during bolus administration of intravenous contrast. Multiplanar CT image reconstructions and MIPs were obtained to evaluate the vascular anatomy. CONTRAST:  3mL ISOVUE-370 IOPAMIDOL (ISOVUE-370) INJECTION 76% COMPARISON:  10/03/2019 chest CT. FINDINGS: Cardiovascular: The study is high quality for the evaluation of pulmonary embolism. Acute segmental and subsegmental lingular pulmonary emboli. No additional pulmonary emboli. Specifically, no lobar or central pulmonary emboli. Atherosclerotic nonaneurysmal thoracic aorta. Normal caliber pulmonary arteries. Normal heart  size. No significant pericardial fluid/thickening. Left anterior descending and left circumflex coronary atherosclerosis. Mediastinum/Nodes: No discrete thyroid nodules. Unremarkable esophagus. No pathologically enlarged axillary, mediastinal or hilar lymph nodes. Lungs/Pleura: No pneumothorax. No pleural effusion. Severe centrilobular and paraseptal emphysema with diffuse bronchial wall thickening. Solid 6 mm peripheral left lower lobe pulmonary nodule (series 13/image 95), stable using similar measurement technique. Stable scattered calcified right lower lobe pulmonary granulomas. Small indistinct patchy subpleural focus of consolidation in the inferior lingula (series 13/image 135), new. No new significant pulmonary nodules. Upper abdomen: No acute abnormality. Musculoskeletal: No aggressive appearing focal osseous lesions. Mild thoracic spondylosis. Partially visualized surgical hardware in the cervical spine from ACDF. Review of the MIP images confirms the above findings. IMPRESSION: 1. Acute segmental and subsegmental lingular pulmonary emboli. No lobar or central pulmonary emboli. 2. New small indistinct patchy subpleural focus of consolidation in the inferior lingula, which could represent a developing small pulmonary infarct versus atelectasis. 3. Stable 6 mm peripheral left lower lobe pulmonary nodule. Suggest follow-up noncontrast chest CT in 12 months in this high risk patient. 4. Severe centrilobular and paraseptal emphysema with diffuse bronchial wall thickening, suggesting COPD. 5. Aortic Atherosclerosis (ICD10-I70.0) and Emphysema (ICD10-J43.9). Critical Value/emergent results were called by telephone at the time of interpretation on 02/25/2020 at 3:48 pm to provider Clyde Lundborg, who verbally acknowledged these results. Electronically Signed   By: Ilona Sorrel M.D.   On: 02/25/2020 15:53   CT Cervical Spine Wo Contrast  Result Date: 03/19/2020 CLINICAL DATA:  [place] EXAM: CT CERVICAL SPINE  WITHOUT CONTRAST TECHNIQUE: Multidetector CT imaging of the cervical spine was performed without intravenous contrast. Multiplanar CT image reconstructions were  also generated. COMPARISON:  10/03/2019 FINDINGS: Alignment: Alignment is grossly anatomic. Skull base and vertebrae: No acute fracture. No primary bone lesion or focal pathologic process. Soft tissues and spinal canal: No prevertebral fluid or swelling. No visible canal hematoma. Disc levels: Previous anterior fusion spanning C3 through C7 is again noted. No evidence of metallic hardware failure or loosening. Stable spondylosis at C2-3 and C7-T1. Upper chest: Airway is patent. Emphysematous changes are seen at the lung apices. Other: Reconstructed images demonstrate no additional findings. IMPRESSION: 1. No acute cervical spine fracture. 2. Stable postsurgical changes from C3 through C7. 3. Stable spondylosis at C2-3 and C7-T1. Electronically Signed   By: Randa Ngo M.D.   On: 03/19/2020 21:55   MR BRAIN WO CONTRAST  Result Date: 03/20/2020 CLINICAL DATA:  Initial evaluation for acute neuro deficit, stroke suspected. EXAM: MRI HEAD WITHOUT CONTRAST TECHNIQUE: Multiplanar, multiecho pulse sequences of the brain and surrounding structures were obtained without intravenous contrast. COMPARISON:  Prior head CT from 03/19/2020. FINDINGS: Brain: Generalized age-related cerebral atrophy. Patchy and confluent T2/FLAIR hyperintensity within the periventricular deep white matter both cerebral hemispheres, most consistent with chronic small vessel ischemic disease. White matter. Few additional small remote infarcts noted about the cerebellum. Scattered remote lacunar infarcts noted about the periventricular No abnormal foci of restricted diffusion to suggest acute or subacute ischemia. Gray-white matter differentiation maintained. No encephalomalacia to suggest chronic cortical infarction. No foci of susceptibility artifact to suggest acute or chronic  intracranial hemorrhage. No mass lesion, midline shift or mass effect. No hydrocephalus or extra-axial fluid collection. Pituitary gland suprasellar region normal. Midline structures intact. Vascular: Major intracranial vascular flow voids are maintained. Skull and upper cervical spine: Craniocervical junction within normal limits. Postsurgical changes partially visualize within the upper cervical spine. Bone marrow signal intensity within normal limits. No scalp soft tissue abnormality. Sinuses/Orbits: Globes and orbital soft tissues within normal limits. Paranasal sinuses are clear. Small bilateral mastoid effusions noted, of doubtful significance. Inner ear structures grossly normal. Visualized nasopharynx unremarkable. Other: None. IMPRESSION: 1. No acute intracranial infarct or other abnormality. 2. Age-related cerebral atrophy with chronic small vessel ischemic disease, with a few scattered remote lacunar infarcts involving the periventricular white matter and cerebellum. Electronically Signed   By: Jeannine Boga M.D.   On: 03/20/2020 05:57   CT ABDOMEN PELVIS W CONTRAST  Result Date: 03/20/2020 CLINICAL DATA:  Status post fall. EXAM: CT ABDOMEN AND PELVIS WITH CONTRAST TECHNIQUE: Multidetector CT imaging of the abdomen and pelvis was performed using the standard protocol following bolus administration of intravenous contrast. CONTRAST:  173mL OMNIPAQUE IOHEXOL 350 MG/ML SOLN COMPARISON:  October 03, 2019 FINDINGS: Lower chest: 8 mm calcified lung nodule is seen within the right lung base. Hepatobiliary: No focal liver abnormality is seen. No gallstones, gallbladder wall thickening, or biliary dilatation. Pancreas: Unremarkable. No pancreatic ductal dilatation or surrounding inflammatory changes. Spleen: Numerous punctate calcified granulomas are seen scattered throughout the spleen. Adrenals/Urinary Tract: Adrenal glands are unremarkable. Kidneys are normal in size, without renal calculi or  hydronephrosis. A 0.8 cm cystic appearing areas seen within the lower pole of the right kidney. The urinary bladder is markedly distended. Stomach/Bowel: There is a small hiatal hernia. Appendix appears normal. No evidence of bowel wall thickening, distention, or inflammatory changes. Vascular/Lymphatic: Aortic atherosclerosis. No enlarged abdominal or pelvic lymph nodes. Reproductive: There is moderate to marked severity prostate gland enlargement. Other: No abdominal wall hernia or abnormality. No abdominopelvic ascites. Musculoskeletal: Degenerative changes are seen throughout the lumbar spine. IMPRESSION:  1. Markedly distended urinary bladder. 2. Moderate to marked severity prostate gland enlargement. 3. Small hiatal hernia. 4. Aortic atherosclerosis. Aortic Atherosclerosis (ICD10-I70.0). Electronically Signed   By: Virgina Norfolk M.D.   On: 03/20/2020 00:13   DG Chest Portable 1 View  Result Date: 03/19/2020 CLINICAL DATA:  Chest pain for 3-4 weeks without shortness of breath or cough. EXAM: PORTABLE CHEST 1 VIEW COMPARISON:  October 03, 2019 FINDINGS: There is no evidence of acute infiltrate, pleural effusion or pneumothorax. The heart size and mediastinal contours are within normal limits. There is mild calcification of the aortic arch. A radiopaque fusion plate and screws are seen overlying the lower cervical spine. IMPRESSION: No active cardiopulmonary disease. Electronically Signed   By: Virgina Norfolk M.D.   On: 03/19/2020 22:08       Subjective: Patient seen and examined.  Wife at bedside.  He tells me that he is ready to go home today.  No new complaints.  He is alert and oriented x3 and communicating well.  Denies headache, blurry vision, lightheadedness, dizziness, chest pain, shortness of breath, palpitation, fever, chills, nausea, vomiting.  Discharge Exam: Vitals:   03/21/20 0839 03/21/20 1128  BP: 122/64 113/81  Pulse: (!) 109 91  Resp: 20 18  Temp: 97.7 F (36.5 C) (!) 97.5  F (36.4 C)  SpO2: 93% 96%   Vitals:   03/20/20 2325 03/21/20 0415 03/21/20 0839 03/21/20 1128  BP: 111/62 (!) 100/56 122/64 113/81  Pulse: (!) 102 92 (!) 109 91  Resp: 18 18 20 18   Temp: 98.1 F (36.7 C) 98.2 F (36.8 C) 97.7 F (36.5 C) (!) 97.5 F (36.4 C)  TempSrc: Oral Oral Oral Oral  SpO2: 92% 91% 93% 96%  Weight:      Height:        General: Pt is alert, awake, not in acute distress, communicating well, on room air Cardiovascular: RRR, S1/S2 +, no rubs, no gallops Respiratory: CTA bilaterally, no wheezing, no rhonchi Abdominal: Soft, NT, ND, bowel sounds + Extremities: no edema, no cyanosis    The results of significant diagnostics from this hospitalization (including imaging, microbiology, ancillary and laboratory) are listed below for reference.     Microbiology: Recent Results (from the past 240 hour(s))  Resp Panel by RT-PCR (Flu A&B, Covid) Nasopharyngeal Swab     Status: None   Collection Time: 03/19/20  9:19 PM   Specimen: Nasopharyngeal Swab; Nasopharyngeal(NP) swabs in vial transport medium  Result Value Ref Range Status   SARS Coronavirus 2 by RT PCR NEGATIVE NEGATIVE Final    Comment: (NOTE) SARS-CoV-2 target nucleic acids are NOT DETECTED.  The SARS-CoV-2 RNA is generally detectable in upper respiratory specimens during the acute phase of infection. The lowest concentration of SARS-CoV-2 viral copies this assay can detect is 138 copies/mL. A negative result does not preclude SARS-Cov-2 infection and should not be used as the sole basis for treatment or other patient management decisions. A negative result may occur with  improper specimen collection/handling, submission of specimen other than nasopharyngeal swab, presence of viral mutation(s) within the areas targeted by this assay, and inadequate number of viral copies(<138 copies/mL). A negative result must be combined with clinical observations, patient history, and  epidemiological information. The expected result is Negative.  Fact Sheet for Patients:  EntrepreneurPulse.com.au  Fact Sheet for Healthcare Providers:  IncredibleEmployment.be  This test is no t yet approved or cleared by the [place] FDA and  has been authorized for detection and/or diagnosis of  SARS-CoV-2 by FDA under an Emergency Use Authorization (EUA). This EUA will remain  in effect (meaning this test can be used) for the duration of the COVID-19 declaration under Section 564(b)(1) of the Act, 21 U.S.C.section 360bbb-3(b)(1), unless the authorization is terminated  or revoked sooner.       Influenza A by PCR NEGATIVE NEGATIVE Final   Influenza B by PCR NEGATIVE NEGATIVE Final    Comment: (NOTE) The Xpert Xpress SARS-CoV-2/FLU/RSV plus assay is intended as an aid in the diagnosis of influenza from Nasopharyngeal swab specimens and should not be used as a sole basis for treatment. Nasal washings and aspirates are unacceptable for Xpert Xpress SARS-CoV-2/FLU/RSV testing.  Fact Sheet for Patients: EntrepreneurPulse.com.au  Fact Sheet for Healthcare Providers: IncredibleEmployment.be  This test is not yet approved or cleared by the [place] FDA and has been authorized for detection and/or diagnosis of SARS-CoV-2 by FDA under an Emergency Use Authorization (EUA). This EUA will remain in effect (meaning this test can be used) for the duration of the COVID-19 declaration under Section 564(b)(1) of the Act, 21 U.S.C. section 360bbb-3(b)(1), unless the authorization is terminated or revoked.  Performed at [facility], [place] [address] [place], [place] [address]   Urine culture     Status: None   Collection Time: 03/19/20  9:33 PM   Specimen: Urine, Random  Result Value Ref Range Status   Specimen Description URINE, RANDOM  Final   Special Requests NONE  Final   Culture   Final    NO  GROWTH Performed at [facility], [place] [address] [place], [place] [address]    Report Status 03/20/2020 FINAL  Final  Blood culture (routine x 2)     Status: None (Preliminary result)   Collection Time: 03/19/20 10:25 PM   Specimen: BLOOD  Result Value Ref Range Status   Specimen Description BLOOD SITE NOT SPECIFIED  Final   Special Requests   Final    BOTTLES DRAWN AEROBIC AND ANAEROBIC Blood Culture results may not be optimal due to an inadequate volume of blood received in culture bottles   Culture   Final    NO GROWTH 2 DAYS Performed at [facility], [place] [address], [place], [place] [address]    Report Status PENDING  Incomplete  Blood culture (routine x 2)     Status: None (Preliminary result)   Collection Time: 03/19/20 10:31 PM   Specimen: BLOOD  Result Value Ref Range Status   Specimen Description BLOOD SITE NOT SPECIFIED  Final   Special Requests   Final    BOTTLES DRAWN AEROBIC AND ANAEROBIC Blood Culture results may not be optimal due to an inadequate volume of blood received in culture bottles   Culture   Final    NO GROWTH 2 DAYS Performed at [facility], [place] [address] [place],  [address]    Report Status PENDING  Incomplete     Labs: BNP (last 3 results) Recent Labs    03/19/20 2119  BNP [id number]   Basic Metabolic Panel: Recent Labs  Lab 03/19/20 2119 03/20/20 0443 03/21/20 0244  NA 131*  --  132*  K 4.1  --  3.4*  CL 94*  --  96*  CO2 25  --  24  GLUCOSE 123*  --  76  BUN 16  --  14  CREATININE 1.28*  --  1.07  CALCIUM 8.9  --  8.6*  MG  --  1.7 2.0  PHOS  --  3.2  --    Liver Function Tests: Recent Labs  Lab 03/19/20 2119 03/21/20 0244  AST 35 43*  ALT 28 31  ALKPHOS 65 54  BILITOT 1.2 0.8  PROT 7.0 6.6  ALBUMIN 3.3* 3.0*   Recent Labs  Lab 03/19/20 2119  LIPASE 21   Recent Labs  Lab 03/20/20 0443  AMMONIA 22   CBC: Recent Labs  Lab 03/19/20 2119 03/21/20 0244  WBC 7.0 7.0  NEUTROABS 5.3  --    HGB 14.9 14.1  HCT 42.2 40.5  MCV 96.3 95.5  PLT 122* 139*   Cardiac Enzymes: No results for input(s): CKTOTAL, CKMB, CKMBINDEX, TROPONINI in the last 168 hours. BNP: Invalid input(s): POCBNP CBG: Recent Labs  Lab 03/20/20 1151 03/20/20 1556 03/20/20 2200 03/21/20 0608 03/21/20 1131  GLUCAP 121* 82 139* 91 166*   D-Dimer No results for input(s): DDIMER in the last 72 hours. Hgb A1c Recent Labs    03/20/20 0632  HGBA1C 7.7*   Lipid Profile No results for input(s): CHOL, HDL, LDLCALC, TRIG, CHOLHDL, LDLDIRECT in the last 72 hours. Thyroid function studies Recent Labs    03/19/20 2129  TSH 4.110   Anemia work up Recent Labs    03/20/20 0455  VITAMINB12 466  FOLATE 16.4   Urinalysis    Component Value Date/Time   COLORURINE YELLOW 03/19/2020 2146   APPEARANCEUR CLEAR 03/19/2020 2146   LABSPEC 1.020 03/19/2020 2146   PHURINE 5.0 03/19/2020 2146   GLUCOSEU >=500 (A) 03/19/2020 2146   HGBUR NEGATIVE 03/19/2020 2146   BILIRUBINUR NEGATIVE 03/19/2020 2146   KETONESUR 20 (A) 03/19/2020 2146   PROTEINUR NEGATIVE 03/19/2020 2146   UROBILINOGEN 1.0 04/20/2014 1604   NITRITE NEGATIVE 03/19/2020 2146   LEUKOCYTESUR NEGATIVE 03/19/2020 2146   Sepsis Labs Invalid input(s): PROCALCITONIN,  WBC,  LACTICIDVEN Microbiology Recent Results (from the past 240 hour(s))  Resp Panel by RT-PCR (Flu A&B, Covid) Nasopharyngeal Swab     Status: None   Collection Time: 03/19/20  9:19 PM   Specimen: Nasopharyngeal Swab; Nasopharyngeal(NP) swabs in vial transport medium  Result Value Ref Range Status   SARS Coronavirus 2 by RT PCR NEGATIVE NEGATIVE Final    Comment: (NOTE) SARS-CoV-2 target nucleic acids are NOT DETECTED.  The SARS-CoV-2 RNA is generally detectable in upper respiratory specimens during the acute phase of infection. The lowest concentration of SARS-CoV-2 viral copies this assay can detect is 138 copies/mL. A negative result does not preclude  SARS-Cov-2 infection and should not be used as the sole basis for treatment or other patient management decisions. A negative result may occur with  improper specimen collection/handling, submission of specimen other than nasopharyngeal swab, presence of viral mutation(s) within the areas targeted by this assay, and inadequate number of viral copies(<138 copies/mL). A negative result must be combined with clinical observations, patient history, and epidemiological information. The expected result is Negative.  Fact Sheet for Patients:  BloggerCourse.comhttps://www.fda.gov/media/152166/download  Fact Sheet for Healthcare Providers:  SeriousBroker.ithttps://www.fda.gov/media/152162/download  This test is no t yet approved or cleared by the [place] FDA and  has been authorized for detection and/or diagnosis of SARS-CoV-2 by FDA under an Emergency Use Authorization (EUA). This EUA will remain  in effect (meaning this test can be used) for the duration of the COVID-19 declaration under Section 564(b)(1) of the Act, 21 U.S.C.section 360bbb-3(b)(1), unless the authorization is terminated  or revoked sooner.       Influenza A by PCR NEGATIVE  NEGATIVE Final   Influenza B by PCR NEGATIVE NEGATIVE Final    Comment: (NOTE) The Xpert Xpress SARS-CoV-2/FLU/RSV plus assay is intended as an aid in the diagnosis of influenza from Nasopharyngeal swab specimens and should not be used as a sole basis for treatment. Nasal washings and aspirates are unacceptable for Xpert Xpress SARS-CoV-2/FLU/RSV testing.  Fact Sheet for Patients: EntrepreneurPulse.com.au  Fact Sheet for Healthcare Providers: IncredibleEmployment.be  This test is not yet approved or cleared by the [place] FDA and has been authorized for detection and/or diagnosis of SARS-CoV-2 by FDA under an Emergency Use Authorization (EUA). This EUA will remain in effect (meaning this test can be used) for the duration of  the COVID-19 declaration under Section 564(b)(1) of the Act, 21 U.S.C. section 360bbb-3(b)(1), unless the authorization is terminated or revoked.  Performed at [facility], [place] [address], [place], [place] [address]   Urine culture     Status: None   Collection Time: 03/19/20  9:33 PM   Specimen: Urine, Random  Result Value Ref Range Status   Specimen Description URINE, RANDOM  Final   Special Requests NONE  Final   Culture   Final    NO GROWTH Performed at [facility], [place] [address] [place], [place] [address]    Report Status 03/20/2020 FINAL  Final  Blood culture (routine x 2)     Status: None (Preliminary result)   Collection Time: 03/19/20 10:25 PM   Specimen: BLOOD  Result Value Ref Range Status   Specimen Description BLOOD SITE NOT SPECIFIED  Final   Special Requests   Final    BOTTLES DRAWN AEROBIC AND ANAEROBIC Blood Culture results may not be optimal due to an inadequate volume of blood received in culture bottles   Culture   Final    NO GROWTH 2 DAYS Performed at [facility], [place] [address] [place], [place] [address]    Report Status PENDING  Incomplete  Blood culture (routine x 2)     Status: None (Preliminary result)   Collection Time: 03/19/20 10:31 PM   Specimen: BLOOD  Result Value Ref Range Status   Specimen Description BLOOD SITE NOT SPECIFIED  Final   Special Requests   Final    BOTTLES DRAWN AEROBIC AND ANAEROBIC Blood Culture results may not be optimal due to an inadequate volume of blood received in culture bottles   Culture   Final    NO GROWTH 2 DAYS Performed at [facility], [place] [address], [place], [place] [address]    Report Status PENDING  Incomplete     Time coordinating discharge: Over 30 minutes  SIGNED:   Mckinley Jewel, MD  [facility] 03/21/2020, 2:14 PM Pager   If 7PM-7AM, please contact night-coverage [email or web address]

## 2020-03-24 LAB — CULTURE, BLOOD (ROUTINE X 2)
Culture: NO GROWTH
Culture: NO GROWTH

## 2020-05-11 DIAGNOSIS — C44329 Squamous cell carcinoma of skin of other parts of face: Secondary | ICD-10-CM | POA: Diagnosis not present

## 2020-05-11 DIAGNOSIS — L905 Scar conditions and fibrosis of skin: Secondary | ICD-10-CM | POA: Diagnosis not present

## 2020-05-11 DIAGNOSIS — L570 Actinic keratosis: Secondary | ICD-10-CM | POA: Diagnosis not present

## 2020-05-11 DIAGNOSIS — D485 Neoplasm of uncertain behavior of skin: Secondary | ICD-10-CM | POA: Diagnosis not present

## 2020-05-11 DIAGNOSIS — Z85828 Personal history of other malignant neoplasm of skin: Secondary | ICD-10-CM | POA: Diagnosis not present

## 2020-05-11 DIAGNOSIS — C4442 Squamous cell carcinoma of skin of scalp and neck: Secondary | ICD-10-CM | POA: Diagnosis not present

## 2020-05-21 DIAGNOSIS — L905 Scar conditions and fibrosis of skin: Secondary | ICD-10-CM | POA: Diagnosis not present

## 2020-05-21 DIAGNOSIS — D0439 Carcinoma in situ of skin of other parts of face: Secondary | ICD-10-CM | POA: Diagnosis not present

## 2020-05-21 DIAGNOSIS — L570 Actinic keratosis: Secondary | ICD-10-CM | POA: Diagnosis not present

## 2020-05-21 DIAGNOSIS — C4442 Squamous cell carcinoma of skin of scalp and neck: Secondary | ICD-10-CM | POA: Diagnosis not present

## 2020-06-03 ENCOUNTER — Ambulatory Visit: Payer: Medicare Other | Admitting: Cardiology

## 2020-06-03 ENCOUNTER — Encounter: Payer: Self-pay | Admitting: Cardiology

## 2020-06-03 ENCOUNTER — Other Ambulatory Visit: Payer: Self-pay

## 2020-06-03 VITALS — BP 124/85 | HR 105 | Temp 98.0°F | Resp 17 | Ht 68.0 in | Wt 160.8 lb

## 2020-06-03 DIAGNOSIS — E7800 Pure hypercholesterolemia, unspecified: Secondary | ICD-10-CM | POA: Diagnosis not present

## 2020-06-03 DIAGNOSIS — R0609 Other forms of dyspnea: Secondary | ICD-10-CM | POA: Diagnosis not present

## 2020-06-03 DIAGNOSIS — I10 Essential (primary) hypertension: Secondary | ICD-10-CM | POA: Diagnosis not present

## 2020-06-03 DIAGNOSIS — I739 Peripheral vascular disease, unspecified: Secondary | ICD-10-CM | POA: Diagnosis not present

## 2020-06-03 DIAGNOSIS — J432 Centrilobular emphysema: Secondary | ICD-10-CM | POA: Diagnosis not present

## 2020-06-03 DIAGNOSIS — R0600 Dyspnea, unspecified: Secondary | ICD-10-CM

## 2020-06-03 MED ORDER — BUDESONIDE-FORMOTEROL FUMARATE 160-4.5 MCG/ACT IN AERO: 2.0000 | INHALATION_SPRAY | Freq: Two times a day (BID) | RESPIRATORY_TRACT | 12 refills | Status: AC

## 2020-06-03 NOTE — Progress Notes (Signed)
Primary Physician/Referring:  Aletha Halim., PA-C  Patient ID: Terry Munoz, male    DOB: [birth year], 70 y.o.   MRN: [id number]  CC: Claudication and hypertension  HPI:    Terry Munoz  is a 70 y.o. Caucasian male with greater than 100-pack-year history of smoking, hyperlipidemia, hypertension, diabetes mellitus, peripheral arterial disease with right SFA stenting in 2016 with Viabahn covered stents, here for12 month follow-up of PAD and hypertension.   He is presently doing well and states that he has minimal symptoms of claudication in his legs with activity and states he has not had any further confusional state, was admitted to the hospital in summer 2021 with altered mental status and acute on chronic respiratory failure.  Alcohol, tobacco and metabolic issues felt to be the etiology.  He has reduced drinking alcohol now and he also has reduced smoking cigarettes.  Accompanied by his wife.  No specific complaints otherwise.     Past Medical History:  Diagnosis Date  . COPD (chronic obstructive pulmonary disease) ([place])   . 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 Hyperlipidemia 10/04/2019  . Hypertension   . Hyponatremia 03/2014  . Insomnia   . Kidney stones   . Mouth ulcers   . Osteoarthritis   . Otitis media   . Peripheral vascular disease ([place])   . Sinusitis    Past Surgical History:  Procedure Laterality Date  . DENTAL SURGERY    . FEMORAL-POPLITEAL BYPASS GRAFT Right 07/24/2015   Procedure: Thrombectomy Right Femoral-Popliteal Artery; Thrombectomy Right Anterior-Tibial Artery, Thrombectomy Right Tibial-Peroneal Trunk; Bovine Patch Angioplasty Right Popliteal Artery; Endarterectomy Right Anterior Tibial Artery and Right Tibial-Peroneal Trunk; Right Leg Angiogram, Four Compartment Fasciotomy Right Lower Leg;  Surgeon: Conrad [place], MD;  Location: [facility];  Service: Vascular;  L  . HERNIA REPAIR    . lithotomy    . LOWER EXTREMITY  ANGIOGRAM N/A 05/01/2014   Procedure: LOWER EXTREMITY ANGIOGRAM;  Surgeon: Laverda Page, MD;  Location: [facility] CATH LAB;  Service: Cardiovascular;  Laterality: N/A;  . NECK SURGERY    . POPLITEAL ARTERY ANGIOPLASTY Right 05/02/2014   to mid sfa  . THROMBECTOMY  05/02/2014    Social History   Tobacco Use  . Smoking status: Current Every Day Smoker    Packs/day: 2.00    Years: 45.00    Pack years: 90.00    Types: Cigarettes  . Smokeless tobacco: Never Used  Substance Use Topics  . Alcohol use: Yes    Alcohol/week: 24.0 standard drinks    Types: 24 Cans of beer per week    Comment: daily  Married    ROS  Review of Systems  Cardiovascular: Positive for claudication (minimal). Negative for chest pain and leg swelling.  Respiratory: Positive for cough, shortness of breath and wheezing.   Gastrointestinal: Negative for melena.   Objective  Blood pressure 124/85, pulse (!) 105, temperature 98 F (36.7 C), temperature source Temporal, resp. rate 17, height 5\' 8"  (1.727 m), weight 160 lb 12.8 oz (72.9 kg), SpO2 96 %. Body mass index is 24.45 kg/m.   Vitals with BMI 06/03/2020 03/21/2020 03/21/2020  Height 5\' 8"  - -  Weight 160 lbs 13 oz - -  BMI 83.38 - -  Systolic 250 539 767  Diastolic 85 81 64  Pulse 341 91 109     Physical Exam Constitutional:      General: He is not in acute distress.  Appearance: He is well-developed.  Neck:     Vascular: No JVD.  Cardiovascular:     Rate and Rhythm: Normal rate and regular rhythm.     Heart sounds: Normal heart sounds. No murmur heard. No gallop.      Comments: Bilateral loss of hair present, pigmented, no varicose veins.  Femoral pulses bilateral 2+, popliteal pulse 1+ bilateral, DP absent bilateral, PT left 1+, right absent.   No carotid bruit. No leg edema.   No JVD. Pulmonary:     Effort: Pulmonary effort is normal.     Breath sounds: Rales (Bilateral bronchovesicular breath sounds with occasinal rales bilateral) present.   Abdominal:     General: Bowel sounds are normal.     Palpations: Abdomen is soft.     Comments: Reducible umbilical hernia present.    Radiology: No results found.  Laboratory examination:   Recent Labs    10/03/19 1309 10/03/19 1318 10/04/19 0656 10/04/19 1329 03/19/20 2119 03/21/20 0244  NA 137   < > 137 136 131* 132*  K 3.9   < > 3.9 3.7 4.1 3.4*  CL 98   < > 98 98 94* 96*  CO2 27  --  27 29 25 24   GLUCOSE 81   < > 113* 187* 123* 76  BUN 13   < > 15 15 16 14   CREATININE 1.24   < > 1.17 1.10 1.28* 1.07  CALCIUM 9.1  --  9.0 8.7* 8.9 8.6*  GFRNONAA 59*  --  >60 >60 >60 >60  GFRAA >60  --  >60 >60  --   --    < > = values in this interval not displayed.   CMP Latest Ref Rng & Units 03/21/2020 03/19/2020 10/04/2019  Glucose 70 - 99 mg/dL 76 123(H) 187(H)  BUN 8 - 23 mg/dL 14 16 15   Creatinine 0.61 - 1.24 mg/dL 1.07 1.28(H) 1.10  Sodium 135 - 145 mmol/L 132(L) 131(L) 136  Potassium 3.5 - 5.1 mmol/L 3.4(L) 4.1 3.7  Chloride 98 - 111 mmol/L 96(L) 94(L) 98  CO2 22 - 32 mmol/L 24 25 29   Calcium 8.9 - 10.3 mg/dL 8.6(L) 8.9 8.7(L)  Total Protein 6.5 - 8.1 g/dL 6.6 7.0 7.0  Total Bilirubin 0.3 - 1.2 mg/dL 0.8 1.2 0.9  Alkaline Phos 38 - 126 U/L 54 65 74  AST 15 - 41 U/L 43(H) 35 26  ALT 0 - 44 U/L 31 28 24    CBC Latest Ref Rng & Units 03/21/2020 03/19/2020 10/04/2019  WBC 4.0 - 10.5 K/uL 7.0 7.0 7.2  Hemoglobin 13.0 - 17.0 g/dL 14.1 14.9 15.3  Hematocrit 39.0 - 52.0 % 40.5 42.2 45.3  Platelets 150 - 400 K/uL 139(L) 122(L) 170   Lipid Panel     Component Value Date/Time   CHOL 176 11/21/2018 0831   TRIG 60 11/21/2018 0831   HDL 102 11/21/2018 0831   CHOLHDL 2.2 04/20/2014 0414   VLDL 11 04/20/2014 0414   LDLCALC 62 11/21/2018 0831   HEMOGLOBIN A1C Lab Results  Component Value Date   HGBA1C 7.7 (H) 03/20/2020   MPG 174.29 03/20/2020   TSH Recent Labs    03/19/20 2129  TSH 4.110   External Labs:   Cholesterol, total 176.000 11/21/2018 HDL 102.000  11/21/2018 LDL 62.000 11/21/2018 Triglycerides 60.000 11/21/2018  A1C 8.100 % 04/29/2019  Hemoglobin 16.900 G/ 11/21/2018; Platelets 191.000 11/21/2018  Creatinine, Serum 1.350 11/21/2018 Potassium 5.400 11/21/2018 ALT (SGPT) 51.000 11/21/2018 TSH 3.020 11/21/2018  Medications  Current Outpatient Medications on File Prior to Visit  Medication Sig Dispense Refill  . albuterol (PROVENTIL HFA;VENTOLIN HFA) 108 (90 Base) MCG/ACT inhaler Inhale 1-2 puffs into the lungs every 6 (six) hours as needed for wheezing or shortness of breath.    Marland Kitchen albuterol (PROVENTIL) (2.5 MG/3ML) 0.083% nebulizer solution Take 2.5 mg by nebulization every 6 (six) hours as needed for wheezing or shortness of breath.     Marland Kitchen amitriptyline (ELAVIL) 50 MG tablet Take 50 mg by mouth at bedtime.    Marland Kitchen atorvastatin (LIPITOR) 40 MG tablet TAKE 1 TABLET DAILY (Patient taking differently: Take 40 mg by mouth at bedtime.) 90 tablet 3  . folic acid (FOLVITE) 1 MG tablet Take 1 tablet (1 mg total) by mouth daily. 30 tablet 0  . GLYXAMBI 10-5 MG TABS Take 1 tablet by mouth at bedtime.    . insulin glargine (LANTUS) 100 UNIT/ML injection Inject 46 Units into the skin daily after breakfast.     . insulin lispro (HUMALOG) 100 UNIT/ML injection Inject 3 Units into the skin 3 (three) times daily after meals.    . irbesartan (AVAPRO) 75 MG tablet Take 1 tablet (75 mg total) by mouth daily. (Patient taking differently: Take 75 mg by mouth at bedtime.) 30 tablet 0  . Multiple Vitamin (MULTIVITAMIN WITH MINERALS) TABS tablet Take 1 tablet by mouth daily.    Marland Kitchen omeprazole (PRILOSEC) 20 MG capsule Take 20 mg by mouth daily as needed (heartburn).    . rivaroxaban (XARELTO) 20 MG TABS tablet Take 1 tablet (20 mg total) by mouth daily with supper. 30 tablet 0  . thiamine (VITAMIN B-1) 50 MG tablet Take 150 mg by mouth daily.     No current facility-administered medications on file prior to visit.    CT Chest 03/19/2020: 1. No evidence of pulmonary  embolus. 2. Marked severity emphysematous lung disease. 3. Small hiatal hernia. 4. Aortic atherosclerosis.  Cardiac Studies:   Echocardiogram 04/14/2014:  Left ventricle cavity is normal in size. Normal global wall motion. Normal diastolic filling pattern. Visual EF is 55-60%. Calculated EF 51%. Trace mitral regurgitation. Trace tricuspid regurgitation. Trace pulmonic regurgitation. Essentially normal echocardiogram.  Lexiscan myoview stress test 04/13/2014: 1. The resting electrocardiogram demonstrated normal sinus rhythm, normal resting conduction, poor R progression, cannot r/o anterior infarct, old. No resting arrhythmias and normal rest repolarization.  Stress EKG is nondiagnostic for ischemia as it is a Pharmacologic stress test using Lexiscan infusion. Stress symptoms included dyspnea, dizziness. 2. Myocardial perfusion imaging is normal. Overall left ventricular systolic function was normal without regional wall motion abnormalities. The left ventricular ejection fraction was 74%.  PV angio 05/01/2014: stenting of the right proximal to mid SFA with implantation of 2 overlapping 6.0 x 150 and a 6 x 100 mm Viabahn covered stents. One vessel R/O (PT) right leg. Mild disease left leg. Impression: Lower extremity arterial duplex 06/14/2016.  Lower extremity arterial duplex 06/14/2016: No hemodynamically significant stenoses are identified in the lower extremity arterial system. This exam reveals mildly decreased perfusion of the  lower extremity, LABI 0.87 and RABI 0.90 withnormal triphasic wavefroms noted at the post tibial artery level.  Compared to the study done on 06/15/2015, no significant change, ABI mildly reduced from 0.95, may be due to technical error.  Right SFA stent appears patent.  EKG:  EKG 06/04/2019: Sinus tachycardia at the rate of 100 bpm, left axis deviation, left anterior fascicular block.  Poor R wave progression, cannot exclude anteroseptal infarct old.  Low-voltage  complexes.  Single PVC.  No evidence of ischemia.   -possible pulmonary disease.  No significant change from  EKG 11/08/2017.  Assessment     ICD-10-CM   1. Essential hypertension  I10 EKG 12-Lead  2. PAD (peripheral artery disease) (HCC)  I73.9   3. Dyspnea on exertion  R06.00   4. Hypercholesteremia  E78.00   5. Centrilobular emphysema ([place])  J43.2     Meds ordered this encounter  Medications  . budesonide-formoterol (SYMBICORT) 160-4.5 MCG/ACT inhaler    Sig: Inhale 2 puffs into the lungs in the morning and at bedtime.    Dispense:  3 each    Refill:  12    Discontinue adviar, efficacy   Medications Discontinued During This Encounter  Medication Reason  . budesonide-formoterol (SYMBICORT) 160-4.5 MCG/ACT inhaler Error  . Emollient (AQUAPHILIC) OINT Error  . fluorouracil (EFUDEX) 5 % cream Error  . ADVAIR DISKUS 500-50 MCG/DOSE AEPB Ineffective   Orders Placed This Encounter  Procedures  . EKG 12-Lead       Recommendations:   Terry Munoz  is a 70 y.o. Caucasian male with greater than 100-pack-year history of smoking, hyperlipidemia, hypertension, diabetes mellitus, peripheral arterial disease with right SFA stenting in 2016 with Viabahn covered stents, here for 1 year follow-up of PAD and hypertension.   Patient's symptoms of claudication remained stable. His underlying sinus tachycardia is due to underlying COPD, dead space ventilation. Smoking cessation again discussed. Dyspnea is stable. No clinical CHF.   I reviewed his hospital records from recent hospitalization when he was admitted with altered mental status.  Since then he has refused drinking alcohol, also has reduced smoking as well.  I encouraged him to see if he could completely quit drinking alcohol as he states that he is unable to quit smoking.  I am glad that he has reduced it.  CT scan reveals extensive emphysematous changes.  He is presently on Advair Diskus, however he would like to go back to taking  Symbicort.  Prescription given.  Otherwise on stable cardiac medications, lipids are well controlled, will continue see him back on annual basis.   Adrian Prows, MD, [facility] 06/03/2020, 2:06 PM Office: [phone] Pager: [phone]

## 2020-06-08 DIAGNOSIS — I2693 Single subsegmental pulmonary embolism without acute cor pulmonale: Secondary | ICD-10-CM | POA: Diagnosis not present

## 2020-06-08 DIAGNOSIS — E7800 Pure hypercholesterolemia, unspecified: Secondary | ICD-10-CM | POA: Diagnosis not present

## 2020-06-08 DIAGNOSIS — J432 Centrilobular emphysema: Secondary | ICD-10-CM | POA: Insufficient documentation

## 2020-06-08 DIAGNOSIS — Z794 Long term (current) use of insulin: Secondary | ICD-10-CM | POA: Diagnosis not present

## 2020-06-08 DIAGNOSIS — I739 Peripheral vascular disease, unspecified: Secondary | ICD-10-CM | POA: Diagnosis not present

## 2020-06-08 DIAGNOSIS — Z7984 Long term (current) use of oral hypoglycemic drugs: Secondary | ICD-10-CM | POA: Diagnosis not present

## 2020-06-08 DIAGNOSIS — F17210 Nicotine dependence, cigarettes, uncomplicated: Secondary | ICD-10-CM | POA: Diagnosis not present

## 2020-06-08 DIAGNOSIS — I10 Essential (primary) hypertension: Secondary | ICD-10-CM | POA: Diagnosis not present

## 2020-06-08 DIAGNOSIS — E1151 Type 2 diabetes mellitus with diabetic peripheral angiopathy without gangrene: Secondary | ICD-10-CM | POA: Diagnosis not present

## 2020-06-25 ENCOUNTER — Other Ambulatory Visit: Payer: Self-pay | Admitting: Cardiology

## 2020-07-28 DIAGNOSIS — Z85828 Personal history of other malignant neoplasm of skin: Secondary | ICD-10-CM | POA: Diagnosis not present

## 2020-07-28 DIAGNOSIS — D485 Neoplasm of uncertain behavior of skin: Secondary | ICD-10-CM | POA: Diagnosis not present

## 2020-07-28 DIAGNOSIS — C4442 Squamous cell carcinoma of skin of scalp and neck: Secondary | ICD-10-CM | POA: Diagnosis not present

## 2020-07-28 DIAGNOSIS — C44629 Squamous cell carcinoma of skin of left upper limb, including shoulder: Secondary | ICD-10-CM | POA: Diagnosis not present

## 2020-07-28 DIAGNOSIS — B078 Other viral warts: Secondary | ICD-10-CM | POA: Diagnosis not present

## 2020-07-28 DIAGNOSIS — C44529 Squamous cell carcinoma of skin of other part of trunk: Secondary | ICD-10-CM | POA: Diagnosis not present

## 2020-07-28 DIAGNOSIS — L905 Scar conditions and fibrosis of skin: Secondary | ICD-10-CM | POA: Diagnosis not present

## 2020-08-19 DIAGNOSIS — E1142 Type 2 diabetes mellitus with diabetic polyneuropathy: Secondary | ICD-10-CM | POA: Diagnosis not present

## 2020-08-19 DIAGNOSIS — M79676 Pain in unspecified toe(s): Secondary | ICD-10-CM | POA: Diagnosis not present

## 2020-08-19 DIAGNOSIS — B351 Tinea unguium: Secondary | ICD-10-CM | POA: Diagnosis not present

## 2020-08-19 DIAGNOSIS — L84 Corns and callosities: Secondary | ICD-10-CM | POA: Diagnosis not present

## 2020-08-31 DIAGNOSIS — D0462 Carcinoma in situ of skin of left upper limb, including shoulder: Secondary | ICD-10-CM | POA: Diagnosis not present

## 2020-08-31 DIAGNOSIS — D044 Carcinoma in situ of skin of scalp and neck: Secondary | ICD-10-CM | POA: Diagnosis not present

## 2020-09-08 DIAGNOSIS — H6123 Impacted cerumen, bilateral: Secondary | ICD-10-CM | POA: Diagnosis not present

## 2020-09-22 DIAGNOSIS — D045 Carcinoma in situ of skin of trunk: Secondary | ICD-10-CM | POA: Diagnosis not present

## 2020-09-22 DIAGNOSIS — D485 Neoplasm of uncertain behavior of skin: Secondary | ICD-10-CM | POA: Diagnosis not present

## 2020-10-11 DIAGNOSIS — J432 Centrilobular emphysema: Secondary | ICD-10-CM | POA: Diagnosis not present

## 2020-10-11 DIAGNOSIS — G43D0 Abdominal migraine, not intractable: Secondary | ICD-10-CM | POA: Diagnosis not present

## 2020-10-11 DIAGNOSIS — Z0000 Encounter for general adult medical examination without abnormal findings: Secondary | ICD-10-CM | POA: Diagnosis not present

## 2020-10-11 DIAGNOSIS — I739 Peripheral vascular disease, unspecified: Secondary | ICD-10-CM | POA: Diagnosis not present

## 2020-10-11 DIAGNOSIS — E7800 Pure hypercholesterolemia, unspecified: Secondary | ICD-10-CM | POA: Diagnosis not present

## 2020-10-11 DIAGNOSIS — E1151 Type 2 diabetes mellitus with diabetic peripheral angiopathy without gangrene: Secondary | ICD-10-CM | POA: Diagnosis not present

## 2020-10-14 DIAGNOSIS — E1151 Type 2 diabetes mellitus with diabetic peripheral angiopathy without gangrene: Secondary | ICD-10-CM | POA: Diagnosis not present

## 2020-10-14 DIAGNOSIS — I739 Peripheral vascular disease, unspecified: Secondary | ICD-10-CM | POA: Diagnosis not present

## 2020-10-14 DIAGNOSIS — E7800 Pure hypercholesterolemia, unspecified: Secondary | ICD-10-CM | POA: Diagnosis not present

## 2020-10-14 DIAGNOSIS — J432 Centrilobular emphysema: Secondary | ICD-10-CM | POA: Diagnosis not present

## 2020-10-28 DIAGNOSIS — Z85828 Personal history of other malignant neoplasm of skin: Secondary | ICD-10-CM | POA: Diagnosis not present

## 2020-10-28 DIAGNOSIS — D485 Neoplasm of uncertain behavior of skin: Secondary | ICD-10-CM | POA: Diagnosis not present

## 2020-10-28 DIAGNOSIS — L905 Scar conditions and fibrosis of skin: Secondary | ICD-10-CM | POA: Diagnosis not present

## 2020-10-28 DIAGNOSIS — L570 Actinic keratosis: Secondary | ICD-10-CM | POA: Diagnosis not present

## 2020-10-28 DIAGNOSIS — C4442 Squamous cell carcinoma of skin of scalp and neck: Secondary | ICD-10-CM | POA: Diagnosis not present

## 2020-10-28 DIAGNOSIS — C44529 Squamous cell carcinoma of skin of other part of trunk: Secondary | ICD-10-CM | POA: Diagnosis not present

## 2021-01-04 DIAGNOSIS — D044 Carcinoma in situ of skin of scalp and neck: Secondary | ICD-10-CM | POA: Diagnosis not present

## 2021-01-12 DIAGNOSIS — Z23 Encounter for immunization: Secondary | ICD-10-CM | POA: Diagnosis not present

## 2021-01-18 DIAGNOSIS — D045 Carcinoma in situ of skin of trunk: Secondary | ICD-10-CM | POA: Diagnosis not present

## 2021-01-18 DIAGNOSIS — C44329 Squamous cell carcinoma of skin of other parts of face: Secondary | ICD-10-CM | POA: Diagnosis not present

## 2021-01-18 DIAGNOSIS — D485 Neoplasm of uncertain behavior of skin: Secondary | ICD-10-CM | POA: Diagnosis not present

## 2021-01-18 DIAGNOSIS — L570 Actinic keratosis: Secondary | ICD-10-CM | POA: Diagnosis not present

## 2021-02-01 DIAGNOSIS — M545 Low back pain, unspecified: Secondary | ICD-10-CM | POA: Diagnosis not present

## 2021-02-03 DIAGNOSIS — C44329 Squamous cell carcinoma of skin of other parts of face: Secondary | ICD-10-CM | POA: Diagnosis not present

## 2021-02-03 DIAGNOSIS — C4492 Squamous cell carcinoma of skin, unspecified: Secondary | ICD-10-CM | POA: Diagnosis not present

## 2021-02-03 DIAGNOSIS — Z481 Encounter for planned postprocedural wound closure: Secondary | ICD-10-CM | POA: Diagnosis not present

## 2021-03-08 DIAGNOSIS — B351 Tinea unguium: Secondary | ICD-10-CM | POA: Diagnosis not present

## 2021-03-08 DIAGNOSIS — L84 Corns and callosities: Secondary | ICD-10-CM | POA: Diagnosis not present

## 2021-03-08 DIAGNOSIS — M79676 Pain in unspecified toe(s): Secondary | ICD-10-CM | POA: Diagnosis not present

## 2021-03-08 DIAGNOSIS — E1142 Type 2 diabetes mellitus with diabetic polyneuropathy: Secondary | ICD-10-CM | POA: Diagnosis not present

## 2021-05-24 ENCOUNTER — Other Ambulatory Visit: Payer: Self-pay | Admitting: Family Medicine

## 2021-05-24 DIAGNOSIS — M79604 Pain in right leg: Secondary | ICD-10-CM

## 2021-05-25 ENCOUNTER — Ambulatory Visit
Admission: RE | Admit: 2021-05-25 | Discharge: 2021-05-25 | Disposition: A | Discharge status: Home / Self Care | Payer: Medicare Other | Source: Ambulatory Visit | Attending: Family Medicine | Admitting: Family Medicine

## 2021-05-25 DIAGNOSIS — M79604 Pain in right leg: Secondary | ICD-10-CM

## 2021-05-26 ENCOUNTER — Other Ambulatory Visit: Payer: Self-pay

## 2021-05-26 ENCOUNTER — Encounter (HOSPITAL_COMMUNITY): Payer: Self-pay

## 2021-05-26 ENCOUNTER — Observation Stay (HOSPITAL_COMMUNITY): Payer: Medicare Other

## 2021-05-26 ENCOUNTER — Inpatient Hospital Stay (HOSPITAL_COMMUNITY)
Admission: EM | Admit: 2021-05-26 | Discharge: 2021-05-27 | DRG: 301 | Disposition: A | Discharge status: Home / Self Care | Payer: Medicare Other | Attending: Internal Medicine | Admitting: Internal Medicine

## 2021-05-26 ENCOUNTER — Ambulatory Visit
Admission: RE | Admit: 2021-05-26 | Discharge: 2021-05-26 | Disposition: A | Discharge status: Home / Self Care | Payer: Medicare Other | Source: Ambulatory Visit | Attending: Family Medicine | Admitting: Family Medicine

## 2021-05-26 DIAGNOSIS — F1020 Alcohol dependence, uncomplicated: Secondary | ICD-10-CM | POA: Diagnosis present

## 2021-05-26 DIAGNOSIS — M25571 Pain in right ankle and joints of right foot: Secondary | ICD-10-CM | POA: Diagnosis present

## 2021-05-26 DIAGNOSIS — E1122 Type 2 diabetes mellitus with diabetic chronic kidney disease: Secondary | ICD-10-CM | POA: Diagnosis not present

## 2021-05-26 DIAGNOSIS — Z7951 Long term (current) use of inhaled steroids: Secondary | ICD-10-CM | POA: Diagnosis not present

## 2021-05-26 DIAGNOSIS — E785 Hyperlipidemia, unspecified: Secondary | ICD-10-CM | POA: Diagnosis not present

## 2021-05-26 DIAGNOSIS — I70211 Atherosclerosis of native arteries of extremities with intermittent claudication, right leg: Secondary | ICD-10-CM | POA: Diagnosis not present

## 2021-05-26 DIAGNOSIS — Z95828 Presence of other vascular implants and grafts: Secondary | ICD-10-CM | POA: Diagnosis not present

## 2021-05-26 DIAGNOSIS — Z716 Tobacco abuse counseling: Secondary | ICD-10-CM | POA: Diagnosis not present

## 2021-05-26 DIAGNOSIS — M79604 Pain in right leg: Secondary | ICD-10-CM

## 2021-05-26 DIAGNOSIS — M79671 Pain in right foot: Secondary | ICD-10-CM

## 2021-05-26 DIAGNOSIS — J9601 Acute respiratory failure with hypoxia: Secondary | ICD-10-CM | POA: Diagnosis not present

## 2021-05-26 DIAGNOSIS — M79673 Pain in unspecified foot: Secondary | ICD-10-CM

## 2021-05-26 DIAGNOSIS — Z20822 Contact with and (suspected) exposure to covid-19: Secondary | ICD-10-CM | POA: Diagnosis not present

## 2021-05-26 DIAGNOSIS — I129 Hypertensive chronic kidney disease with stage 1 through stage 4 chronic kidney disease, or unspecified chronic kidney disease: Secondary | ICD-10-CM | POA: Diagnosis present

## 2021-05-26 DIAGNOSIS — Z86718 Personal history of other venous thrombosis and embolism: Secondary | ICD-10-CM | POA: Diagnosis not present

## 2021-05-26 DIAGNOSIS — Z86711 Personal history of pulmonary embolism: Secondary | ICD-10-CM | POA: Diagnosis not present

## 2021-05-26 DIAGNOSIS — I739 Peripheral vascular disease, unspecified: Secondary | ICD-10-CM | POA: Insufficient documentation

## 2021-05-26 DIAGNOSIS — I70201 Unspecified atherosclerosis of native arteries of extremities, right leg: Secondary | ICD-10-CM

## 2021-05-26 DIAGNOSIS — F1010 Alcohol abuse, uncomplicated: Secondary | ICD-10-CM | POA: Insufficient documentation

## 2021-05-26 DIAGNOSIS — J449 Chronic obstructive pulmonary disease, unspecified: Secondary | ICD-10-CM | POA: Diagnosis not present

## 2021-05-26 DIAGNOSIS — E119 Type 2 diabetes mellitus without complications: Secondary | ICD-10-CM

## 2021-05-26 DIAGNOSIS — I70221 Atherosclerosis of native arteries of extremities with rest pain, right leg: Secondary | ICD-10-CM | POA: Diagnosis present

## 2021-05-26 DIAGNOSIS — Z79899 Other long term (current) drug therapy: Secondary | ICD-10-CM | POA: Diagnosis not present

## 2021-05-26 DIAGNOSIS — F17210 Nicotine dependence, cigarettes, uncomplicated: Secondary | ICD-10-CM | POA: Diagnosis present

## 2021-05-26 DIAGNOSIS — Z7901 Long term (current) use of anticoagulants: Secondary | ICD-10-CM

## 2021-05-26 DIAGNOSIS — N1831 Chronic kidney disease, stage 3a: Secondary | ICD-10-CM | POA: Diagnosis present

## 2021-05-26 DIAGNOSIS — E1151 Type 2 diabetes mellitus with diabetic peripheral angiopathy without gangrene: Secondary | ICD-10-CM | POA: Diagnosis not present

## 2021-05-26 DIAGNOSIS — Z8249 Family history of ischemic heart disease and other diseases of the circulatory system: Secondary | ICD-10-CM | POA: Diagnosis not present

## 2021-05-26 DIAGNOSIS — Z882 Allergy status to sulfonamides status: Secondary | ICD-10-CM | POA: Diagnosis not present

## 2021-05-26 DIAGNOSIS — I10 Essential (primary) hypertension: Secondary | ICD-10-CM | POA: Diagnosis present

## 2021-05-26 DIAGNOSIS — Z720 Tobacco use: Secondary | ICD-10-CM

## 2021-05-26 DIAGNOSIS — Z794 Long term (current) use of insulin: Secondary | ICD-10-CM | POA: Diagnosis not present

## 2021-05-26 LAB — CBC WITH DIFFERENTIAL/PLATELET
Abs Immature Granulocytes: 0.04 10*3/uL (ref 0.00–0.07)
Basophils Absolute: 0 10*3/uL (ref 0.0–0.1)
Basophils Relative: 0 %
Eosinophils Absolute: 0.2 10*3/uL (ref 0.0–0.5)
Eosinophils Relative: 2 %
HCT: 49.6 % (ref 39.0–52.0)
Hemoglobin: 17.1 g/dL — ABNORMAL HIGH (ref 13.0–17.0)
Immature Granulocytes: 0 %
Lymphocytes Relative: 13 %
Lymphs Abs: 1.2 10*3/uL (ref 0.7–4.0)
MCH: 33.9 pg (ref 26.0–34.0)
MCHC: 34.5 g/dL (ref 30.0–36.0)
MCV: 98.4 fL (ref 80.0–100.0)
Monocytes Absolute: 0.8 10*3/uL (ref 0.1–1.0)
Monocytes Relative: 9 %
Neutro Abs: 7.2 10*3/uL (ref 1.7–7.7)
Neutrophils Relative %: 76 %
Platelets: 182 10*3/uL (ref 150–400)
RBC: 5.04 MIL/uL (ref 4.22–5.81)
RDW: 12.7 % (ref 11.5–15.5)
WBC: 9.4 10*3/uL (ref 4.0–10.5)
nRBC: 0 % (ref 0.0–0.2)

## 2021-05-26 LAB — CBG MONITORING, ED
Glucose-Capillary: 160 mg/dL — ABNORMAL HIGH (ref 70–99)
Glucose-Capillary: 94 mg/dL (ref 70–99)

## 2021-05-26 LAB — PHOSPHORUS: Phosphorus: 3.7 mg/dL (ref 2.5–4.6)

## 2021-05-26 LAB — BASIC METABOLIC PANEL
Anion gap: 15 (ref 5–15)
BUN: 15 mg/dL (ref 8–23)
CO2: 23 mmol/L (ref 22–32)
Calcium: 9.2 mg/dL (ref 8.9–10.3)
Chloride: 101 mmol/L (ref 98–111)
Creatinine, Ser: 1.36 mg/dL — ABNORMAL HIGH (ref 0.61–1.24)
GFR, Estimated: 55 mL/min — ABNORMAL LOW (ref >60)
Glucose, Bld: 158 mg/dL — ABNORMAL HIGH (ref 70–99)
Potassium: 4.3 mmol/L (ref 3.5–5.1)
Sodium: 139 mmol/L (ref 135–145)

## 2021-05-26 LAB — HEPATIC FUNCTION PANEL
ALT: 26 U/L (ref 0–44)
AST: 26 U/L (ref 15–41)
Albumin: 3.6 g/dL (ref 3.5–5.0)
Alkaline Phosphatase: 69 U/L (ref 38–126)
Bilirubin, Direct: 0.1 mg/dL (ref 0.0–0.2)
Indirect Bilirubin: 0.5 mg/dL (ref 0.3–0.9)
Total Bilirubin: 0.6 mg/dL (ref 0.3–1.2)
Total Protein: 6.7 g/dL (ref 6.5–8.1)

## 2021-05-26 LAB — TSH: TSH: 2.885 u[IU]/mL (ref 0.350–4.500)

## 2021-05-26 LAB — RESP PANEL BY RT-PCR (FLU A&B, COVID) ARPGX2
Influenza A by PCR: NEGATIVE
Influenza B by PCR: NEGATIVE
SARS Coronavirus 2 by RT PCR: NEGATIVE

## 2021-05-26 LAB — HEMOGLOBIN A1C
Hgb A1c MFr Bld: 7.7 % — ABNORMAL HIGH (ref 4.8–5.6)
Mean Plasma Glucose: 174.29 mg/dL

## 2021-05-26 LAB — MAGNESIUM: Magnesium: 1.9 mg/dL (ref 1.7–2.4)

## 2021-05-26 LAB — CK: Total CK: 99 U/L (ref 49–397)

## 2021-05-26 MED ORDER — LORAZEPAM 1 MG PO TABS: 0.0000 mg | ORAL_TABLET | Freq: Four times a day (QID) | ORAL | Status: DC

## 2021-05-26 MED ORDER — NICOTINE 21 MG/24HR TD PT24
21.0000 mg | MEDICATED_PATCH | Freq: Every day | TRANSDERMAL | Status: DC
  Administered 2021-05-26 – 2021-05-27 (×2): 21 mg via TRANSDERMAL
  Filled 2021-05-26 (×2): qty 1

## 2021-05-26 MED ORDER — THIAMINE HCL 100 MG PO TABS
100.0000 mg | ORAL_TABLET | Freq: Every day | ORAL | Status: DC
  Administered 2021-05-26: 18:00:00 100 mg via ORAL
  Filled 2021-05-26: qty 1

## 2021-05-26 MED ORDER — THIAMINE HCL 100 MG/ML IJ SOLN
100.0000 mg | Freq: Every day | INTRAMUSCULAR | Status: DC
  Administered 2021-05-27: 100 mg via INTRAVENOUS
  Filled 2021-05-26: qty 2

## 2021-05-26 MED ORDER — LORAZEPAM 2 MG/ML IJ SOLN: 0.0000 mg | Freq: Four times a day (QID) | INTRAMUSCULAR | Status: DC

## 2021-05-26 MED ORDER — HEPARIN (PORCINE) 25000 UT/250ML-% IV SOLN
1150.0000 [IU]/h | INTRAVENOUS | Status: DC
Start: 2021-05-26 — End: 2021-05-27
  Administered 2021-05-26: 1150 [IU]/h via INTRAVENOUS
  Filled 2021-05-26: qty 250

## 2021-05-26 MED ORDER — INSULIN ASPART 100 UNIT/ML IJ SOLN
0.0000 [IU] | INTRAMUSCULAR | Status: DC
  Administered 2021-05-26: 2 [IU] via SUBCUTANEOUS

## 2021-05-26 MED ORDER — LORAZEPAM 2 MG/ML IJ SOLN: 0.0000 mg | Freq: Two times a day (BID) | INTRAMUSCULAR | Status: DC

## 2021-05-26 MED ORDER — CHLORDIAZEPOXIDE HCL 25 MG PO CAPS
50.0000 mg | ORAL_CAPSULE | Freq: Once | ORAL | Status: AC
  Administered 2021-05-26: 50 mg via ORAL
  Filled 2021-05-26: qty 2

## 2021-05-26 MED ORDER — LORAZEPAM 1 MG PO TABS: 0.0000 mg | ORAL_TABLET | Freq: Two times a day (BID) | ORAL | Status: DC

## 2021-05-26 MED ORDER — MELATONIN 3 MG PO TABS
3.0000 mg | ORAL_TABLET | Freq: Every day | ORAL | Status: DC
Start: 2021-05-26 — End: 2021-05-27
  Administered 2021-05-26: 3 mg via ORAL
  Filled 2021-05-26: qty 1

## 2021-05-26 NOTE — Assessment & Plan Note (Signed)
On heparin drip, vascular consulted ?

## 2021-05-26 NOTE — Assessment & Plan Note (Signed)
Admitted for angiogram in the morning vascular surgery is aware continue heparin infusion pain management as needed ? ?

## 2021-05-26 NOTE — Assessment & Plan Note (Signed)
Stable BP somewhat soft hold blood pressure medications for now resume when able ?

## 2021-05-26 NOTE — Assessment & Plan Note (Signed)
-   Spoke about importance of quitting spent 5 minutes discussing options for treatment, prior attempts at quitting, and dangers of smoking ? -At this point patient is    NOT  interested in quitting ? - order nicotine patch  ? - nursing tobacco cessation protocol ? ?

## 2021-05-26 NOTE — Progress Notes (Signed)
ANTICOAGULATION CONSULT NOTE - Initial Consult ? ?Pharmacy Consult for heparin ?Indication: DVT ? ?Allergies  ?Allergen Reactions  ? Sulfa Antibiotics Hives  ? ? ?Patient Measurements: ?Height: 5\' 8"  (172.7 cm) ?Weight: 73.5 kg (162 lb) ?IBW/kg (Calculated) : 68.4 ?Heparin Dosing Weight: TBW ? ?Vital Signs: ?Temp: 97.8 ?F (36.6 ?C) (03/02 1633) ?Temp Source: Oral (03/02 1633) ?BP: 113/67 (03/02 1645) ?Pulse Rate: 99 (03/02 1645) ? ?Labs: ?No results for input(s): HGB, HCT, PLT, APTT, LABPROT, INR, HEPARINUNFRC, HEPRLOWMOCWT, CREATININE, CKTOTAL, CKMB, TROPONINIHS in the last 72 hours. ? ?CrCl cannot be calculated (Patient's most recent lab result is older than the maximum 21 days allowed.). ? ? ?Medical History: ?Past Medical History:  ?Diagnosis Date  ? COPD (chronic obstructive pulmonary disease) ([place])   ? Diabetes mellitus without complication ([place])   ? INSULIN DEPENDENT  ? Hyperlipidemia 10/04/2019  ? Hypertension   ? Hyponatremia 03/2014  ? Insomnia   ? Kidney stones   ? Mouth ulcers   ? Osteoarthritis   ? Otitis media   ? Peripheral vascular disease ([place])   ? Sinusitis   ? ? ?Assessment: ?47 YOM presenting with R-leg pain, ultrasound in clinic with possible arterial occlusion, he is on Xarelto PTA for hx of PE, will start without bolus considering new claudication/limb ischemia ?Vascular plan for angiogram 3/3 ? ?Goal of Therapy:  ?Heparin level 0.3-0.7 units/ml ?aPTT 66-102 seconds ?Monitor platelets by anticoagulation protocol: Yes ?  ?Plan:  ?Heparin gtt at 1150 uits/hr, no bolus ?F/u 8 hour aPTT/HL ?F/u VVS plans ? ?Bertis Ruddy, PharmD ?Clinical Pharmacist ?ED Pharmacist Phone # [phone] ?05/26/2021 5:03 PM ? ? ? ?

## 2021-05-26 NOTE — ED Triage Notes (Signed)
Pt with R leg pain x 3 days. Had ultrasound done today which showed arterial occulusion to same and no pulse in foot.  ?

## 2021-05-26 NOTE — Assessment & Plan Note (Signed)
CIWA protocol ?Monitor for any signs of withdrawal ?

## 2021-05-26 NOTE — Assessment & Plan Note (Signed)
-   Order Sensitive  SSI  ? -    Lantus decrease to 30 units, ? -  check TSH and HgA1C ?  ? ? ?

## 2021-05-26 NOTE — H&P (Signed)
Terry Munoz YSA:[id number] DOB: 03/07/1951 DOA: 05/26/2021     PCP: Aletha Halim., PA-C   Outpatient Specialists:  CARDS:  Dr. Einar Gip    Patient arrived to ER on 05/26/21 at 1558 Referred by Attending Toy Baker, MD   Patient coming from:    home Lives  With family    Chief Complaint:   Chief Complaint  Patient presents with   Leg Pain    HPI: Terry Munoz is a 71 y.o. male with medical history significant of DM2. COPD, PAD, HTN, HLD  ethanol abuse, tobacco abuse , PE in 2021    Presented with   right ankle pain Reports his right ankle started to bother him it hurts to step on his foot no injury, no twisting No swelling Only hurts when he walks on it, he has not taken anything for it   He was seen and had ABI done that showed poor blood supply to right foot he was told to go to ER Drinks 8 drinks  a day no withdrwal symptoms Last etoh yesterday afternoon Smokes 2 packs a day  Reports he is taking xarelto last does was yesterday morning or today he cannot remmember   Initial COVID TEST  NEGATIVE    Lab Results  Component Value Date   [place] 05/26/2021   [place] NEGATIVE 03/19/2020   [place] NEGATIVE 10/03/2019     Regarding pertinent Chronic problems:     Hyperlipidemia -  on statins Lipitor Lipid Panel     Component Value Date/Time   CHOL 176 11/21/2018 0831   TRIG 60 11/21/2018 0831   HDL 102 11/21/2018 0831   CHOLHDL 2.2 04/20/2014 0414   VLDL 11 04/20/2014 0414   LDLCALC 62 11/21/2018 0831   LABVLDL 12 11/21/2018 0831     HTN on avapro   PAD not on aspirin on lipitor and xarelto     DM 2 -  Lab Results  Component Value Date   HGBA1C 7.7 (H) 03/20/2020   on insulin,      COPD -  not  on baseline oxygen symbicort     Hx of DVT/PE  Diagnosed in 02/25/2020. on - anticoagulation with  Xarelto,     CKD stage IIIa- baseline Cr 1.3 Estimated Creatinine Clearance: 47.5 mL/min (A) (by C-G formula  based on SCr of 1.36 mg/dL (H)).  Lab Results  Component Value Date   CREATININE 1.36 (H) 05/26/2021   CREATININE 1.07 03/21/2020   CREATININE 1.28 (H) 03/19/2020     While in ER:    Started on CIWA protocol  Doppler yesterda No significant right lower extremity DVT by ultrasound. Limited visualization of the calf veins Ordered     CXR -  NON acute    Following Medications were ordered in ER: Medications  LORazepam (ATIVAN) injection 0-4 mg (0 mg Intravenous Not Given 05/26/21 1829)    Or  LORazepam (ATIVAN) tablet 0-4 mg ( Oral See Alternative 05/26/21 1829)  LORazepam (ATIVAN) injection 0-4 mg (has no administration in time range)    Or  LORazepam (ATIVAN) tablet 0-4 mg (has no administration in time range)  thiamine tablet 100 mg (100 mg Oral Given 05/26/21 1815)    Or  thiamine (B-1) injection 100 mg ( Intravenous See Alternative 05/26/21 1815)  heparin ADULT infusion 100 units/mL (25000 units/267mL) (1,150 Units/hr Intravenous New Bag/Given 05/26/21 1828)  nicotine (NICODERM CQ - dosed in mg/24 hours) patch 21 mg (21 mg Transdermal Patch Applied 05/26/21  1835)  chlordiazePOXIDE (LIBRIUM) capsule 50 mg (50 mg Oral Given 05/26/21 1815)    _______________________________________________________ ER Provider Called:  Vascular surgery    DrMarland Kitchen  They Recommend admit to medicine   Will see in AM angiogram in AM   ED Triage Vitals  Enc Vitals Group     BP 05/26/21 1633 112/72     Pulse Rate 05/26/21 1633 (!) 106     Resp 05/26/21 1633 17     Temp 05/26/21 1633 97.8 F (36.6 C)     Temp Source 05/26/21 1633 Oral     SpO2 05/26/21 1633 97 %     Weight 05/26/21 1654 162 lb (73.5 kg)     Height 05/26/21 1654 5\' 8"  (1.727 m)     Head Circumference --      Peak Flow --      Pain Score 05/26/21 1624 4     Pain Loc --      Pain Edu? --      Excl. in [place]? --   TMAX(24)@     _________________________________________ Significant initial  Findings: Abnormal Labs Reviewed  CBC WITH  DIFFERENTIAL/PLATELET - Abnormal; Notable for the following components:      Result Value   Hemoglobin 17.1 (*)    All other components within normal limits  BASIC METABOLIC PANEL - Abnormal; Notable for the following components:   Glucose, Bld 158 (*)    Creatinine, Ser 1.36 (*)    GFR, Estimated 55 (*)    All other components within normal limits     _________________________ Troponin   ECG: not Ordered     The recent clinical data is shown below. Vitals:   05/26/21 1725 05/26/21 1730 05/26/21 1745 05/26/21 1815  BP: 104/63 106/67 100/66 109/71  Pulse: 91 87 87 88  Resp:  (!) 21 15 (!) 22  Temp:      TempSrc:      SpO2:  92% 92% 92%  Weight:      Height:          WBC     Component Value Date/Time   WBC 9.4 05/26/2021 1652   LYMPHSABS 1.2 05/26/2021 1652   MONOABS 0.8 05/26/2021 1652   EOSABS 0.2 05/26/2021 1652   BASOSABS 0.0 05/26/2021 1652    Lactic Acid, Venous    Component Value Date/Time   LATICACIDVEN 1.9 10/03/2019 1309      Results for orders placed or performed during the hospital encounter of 05/26/21  Resp Panel by RT-PCR (Flu A&B, Covid) Nasopharyngeal Swab     Status: None   Collection Time: 05/26/21  4:52 PM   Specimen: Nasopharyngeal Swab; Nasopharyngeal(NP) swabs in vial transport medium  Result Value Ref Range Status   SARS Coronavirus 2 by RT PCR NEGATIVE NEGATIVE Final         Influenza A by PCR NEGATIVE NEGATIVE Final   Influenza B by PCR NEGATIVE NEGATIVE Final           _______________________________________________ Hospitalist was called for admission for right lower extremity claudication they have evidence of severe limb ischemia  The following Work up has been ordered so far:  Orders Placed This Encounter  Procedures   Resp Panel by RT-PCR (Flu A&B, Covid) Nasopharyngeal Swab   CBC with Differential   Basic metabolic panel   APTT   Heparin level (unfractionated)   APTT   Heparin level (unfractionated)   CBC   CK    Hepatic function panel   Magnesium  Phosphorus   TSH   Urinalysis, Complete w Microscopic   Clinical institute withdrawal assessment every 6 hours X 48 hours, then every 12 hours x 48 hours   Notify MD if CIWA-AR is greater than 10 for 2 consecutive assessments.   May administer Ativan PO versus IV if patient is tolerating PO intake well   Vital signs every 6 hours X 48 hours, then per unit protocol   Cardiac monitoring   Consult to vascular surgery   heparin per pharmacy consult   Consult for [facility] Admission   Place in observation (patient's expected length of stay will be less than 2 midnights)     OTHER Significant initial  Findings:  labs showing:    Recent Labs  Lab 05/26/21 1652  NA 139  K 4.3  CO2 23  GLUCOSE 158*  BUN 15  CREATININE 1.36*  CALCIUM 9.2    Cr    Up from baseline see below Lab Results  Component Value Date   CREATININE 1.36 (H) 05/26/2021   CREATININE 1.07 03/21/2020   CREATININE 1.28 (H) 03/19/2020    Recent Labs  Lab 05/26/21 2038  AST 26  ALT 26  ALKPHOS 69  BILITOT 0.6  PROT 6.7  ALBUMIN 3.6   Lab Results  Component Value Date   CALCIUM 9.2 05/26/2021   PHOS 3.2 03/20/2020          Plt: Lab Results  Component Value Date   PLT 182 05/26/2021       COVID-19 Labs  No results for input(s): DDIMER, FERRITIN, LDH, CRP in the last 72 hours.  Lab Results  Component Value Date   SARSCOV2NAA NEGATIVE 05/26/2021   SARSCOV2NAA NEGATIVE 03/19/2020   [place] NEGATIVE 10/03/2019        Recent Labs  Lab 05/26/21 1652  WBC 9.4  NEUTROABS 7.2  HGB 17.1*  HCT 49.6  MCV 98.4  PLT 182    HG/HCT  stable,       Component Value Date/Time   HGB 17.1 (H) 05/26/2021 1652   HGB 16.9 11/21/2018 0831   HCT 49.6 05/26/2021 1652   HCT 47.7 11/21/2018 0831   MCV 98.4 05/26/2021 1652   MCV 97 11/21/2018 0831     Cardiac Panel (last 3 results) Recent Labs    05/26/21 2038  CKTOTAL 99        Cultures:     Component Value Date/Time   SDES BLOOD SITE NOT SPECIFIED 03/19/2020 2231   SPECREQUEST  03/19/2020 2231    BOTTLES DRAWN AEROBIC AND ANAEROBIC Blood Culture results may not be optimal due to an inadequate volume of blood received in culture bottles   CULT  03/19/2020 2231    NO GROWTH 5 DAYS Performed at [facility] [address] [place], [place] [address]    REPTSTATUS 03/24/2020 FINAL 03/19/2020 2231     Radiological Exams on Admission: US Venous Img Lower Unilateral Right (DVT)  Result Date: 05/25/2021 CLINICAL DATA:  Right leg pain for 4 days EXAM: RIGHT LOWER EXTREMITY VENOUS DOPPLER ULTRASOUND TECHNIQUE: Gray-scale sonography with graded compression, as well as color Doppler and duplex ultrasound were performed to evaluate the lower extremity deep venous systems from the level of the common femoral vein and including the common femoral, femoral, profunda femoral, popliteal and calf veins including the posterior tibial, peroneal and gastrocnemius veins when visible. The superficial great saphenous vein was also interrogated. Spectral Doppler was utilized to evaluate flow at rest and with distal augmentation  maneuvers in the common femoral, femoral and popliteal veins. COMPARISON:  None. FINDINGS: Contralateral Common Femoral Vein: Respiratory phasicity is normal and symmetric with the symptomatic side. No evidence of thrombus. Normal compressibility. Common Femoral Vein: No evidence of thrombus. Normal compressibility, respiratory phasicity and response to augmentation. Saphenofemoral Junction: No evidence of thrombus. Normal compressibility and flow on color Doppler imaging. Profunda Femoral Vein: No evidence of thrombus. Normal compressibility and flow on color Doppler imaging. Femoral Vein: No evidence of thrombus. Normal compressibility, respiratory phasicity and response to augmentation. Popliteal Vein: No evidence of thrombus. Normal compressibility, respiratory phasicity and  response to augmentation. Calf Veins: Limited assessment of the calf veins. Posterior tibial vein appears grossly patent. Peroneal vein not visualized. Superficial Great Saphenous Vein: No evidence of thrombus. Normal compressibility. IMPRESSION: No significant right lower extremity DVT by ultrasound. Limited visualization of the calf veins. Electronically Signed   By: Jerilynn Mages.  Shick M.D.   On: 05/25/2021 11:00   US ARTERIAL SEG MULTIPLE LE (ABI, SEGMENTAL PRESSURES, PVR'S)  Addendum Date: 05/26/2021   ADDENDUM REPORT: 05/26/2021 14:57 ADDENDUM: These results were called by telephone at the time of interpretation on 05/26/2021 at 2:57 pm to provider Bing Matter , who verbally acknowledged these results. Electronically Signed   By: Markus Daft M.D.   On: 05/26/2021 14:57   Result Date: 05/26/2021 CLINICAL DATA:  71 year old with severe claudication. History of revascularization in the right lower extremity. EXAM: NONINVASIVE PHYSIOLOGIC VASCULAR STUDY OF BILATERAL LOWER EXTREMITIES TECHNIQUE: Non-invasive vascular evaluation of both lower extremities was performed at rest, including calculation of ankle-brachial indices, multiple segmental pressure evaluation, segmental Doppler and segmental pulse volume recording. COMPARISON:  Report from 12/17/2015 FINDINGS: Right Lower Extremity Resting ABI: 0 Resting TBI: 0 Segmental Pressures: No segmental pressures obtained. Great toe pressure: No signal Arterial Waveforms: Monophasic and biphasic waveforms in the right femoral segment. Monophasic waveforms at the right popliteal artery. No signal identified at the posterior tibial artery or anterior tibial artery. PVRs: Abnormal waveforms in the right calf and ankle. Left Lower Extremity: Resting ABI: 1.02 Resting TBI: 0.74 Segmental Pressures: Normal segmental pressures, no significant (20 mmHg) pressure gradient between adjacent segments. Great toe pressure: 95 Arterial Waveforms: Biphasic and triphasic Doppler waveforms in  the left lower extremity. PVRs: Normal PVRs with maintained waveform amplitude, augmentation and quality. Other: Symmetric upper extremity pressures. Ankle Brachial index > 1.4 Non diagnostic secondary to incompressible vessel calcifications 1.0-1.4       Normal 0.9-0.99     Borderline PAD 0.8-0.89     Mild PAD 0.5-0.79     Moderate PAD < 0.5          Severe PAD Toe Brachial Index Normal     >0.65 Moderate  0.53-0.64 Severe     <0.23 Toe Pressures Absolute toe pressure >59mmHg sufficient for wound healing. Toe pressures <69mmHg = critical limb ischemia. IMPRESSION: 1. Absent pulses at the right ankle and could not obtain a right ankle-brachial index. Findings are suggestive for arterial occlusion in the right lower extremity. 2. Left ABI is 1.02. Electronically Signed: By: Markus Daft M.D. On: 05/26/2021 14:48   _______________________________________________________________________________________________________ Latest  Blood pressure 109/71, pulse 88, temperature 97.8 F (36.6 C), temperature source Oral, resp. rate (!) 22, height 5\' 8"  (1.727 m), weight 73.5 kg, SpO2 92 %.   Vitals  labs and radiology finding personally reviewed  Review of Systems:    Pertinent positives include: right foot pain  Constitutional:  No weight loss, night sweats, Fevers, chills, fatigue,  weight loss  HEENT:  No headaches, Difficulty swallowing,Tooth/dental problems,Sore throat,  No sneezing, itching, ear ache, nasal congestion, post nasal drip,  Cardio-vascular:  No chest pain, Orthopnea, PND, anasarca, dizziness, palpitations.no Bilateral lower extremity swelling  GI:  No heartburn, indigestion, abdominal pain, nausea, vomiting, diarrhea, change in bowel habits, loss of appetite, melena, blood in stool, hematemesis Resp:  no shortness of breath at rest. No dyspnea on exertion, No excess mucus, no productive cough, No non-productive cough, No coughing up of blood.No change in color of mucus.No wheezing. Skin:   no rash or lesions. No jaundice GU:  no dysuria, change in color of urine, no urgency or frequency. No straining to urinate.  No flank pain.  Musculoskeletal:  No joint pain or no joint swelling. No decreased range of motion. No back pain.  Psych:  No change in mood or affect. No depression or anxiety. No memory loss.  Neuro: no localizing neurological complaints, no tingling, no weakness, no double vision, no gait abnormality, no slurred speech, no confusion  All systems reviewed and apart from [place] all are negative _______________________________________________________________________________________________ Past Medical History:   Past Medical History:  Diagnosis Date   COPD (chronic obstructive pulmonary disease) ([place])    Diabetes mellitus without complication ([place])    INSULIN DEPENDENT   Hyperlipidemia 10/04/2019   Hypertension    Hyponatremia 03/2014   Insomnia    Kidney stones    Mouth ulcers    Osteoarthritis    Otitis media    Peripheral vascular disease (HCC)    Sinusitis       Past Surgical History:  Procedure Laterality Date   DENTAL SURGERY     FEMORAL-POPLITEAL BYPASS GRAFT Right 07/24/2015   Procedure: Thrombectomy Right Femoral-Popliteal Artery; Thrombectomy Right Anterior-Tibial Artery, Thrombectomy Right Tibial-Peroneal Trunk; Bovine Patch Angioplasty Right Popliteal Artery; Endarterectomy Right Anterior Tibial Artery and Right Tibial-Peroneal Trunk; Right Leg Angiogram, Four Compartment Fasciotomy Right Lower Leg;  Surgeon: Conrad Buffalo, MD;  Location: [facility];  Service: Vascular;  L   HERNIA REPAIR     lithotomy     LOWER EXTREMITY ANGIOGRAM N/A 05/01/2014   Procedure: LOWER EXTREMITY ANGIOGRAM;  Surgeon: Laverda Page, MD;  Location: [facility] CATH LAB;  Service: Cardiovascular;  Laterality: N/A;   NECK SURGERY     POPLITEAL ARTERY ANGIOPLASTY Right 05/02/2014   to mid sfa   THROMBECTOMY  05/02/2014    Social History:  Ambulatory   independently        reports that he has been smoking cigarettes. He has a 90.00 pack-year smoking history. He has never used smokeless tobacco. He reports current alcohol use of about 24.0 standard drinks per week. He reports that he does not use drugs.     Family History:   Family History  Problem Relation Age of Onset   Heart disease Mother    Hypertension Mother    Hypertension Father    Hypertension Sister    Hypertension Brother    Diabetes Brother    Hypertension Sister    ______________________________________________________________________________________________ Allergies: Allergies  Allergen Reactions   Sulfa Antibiotics Hives     Prior to Admission medications   Medication Sig Start Date End Date Taking? Authorizing Provider  albuterol (PROVENTIL HFA;VENTOLIN HFA) 108 (90 Base) MCG/ACT inhaler Inhale 1-2 puffs into the lungs every 6 (six) hours as needed for wheezing or shortness of breath.    [provider]  albuterol (PROVENTIL) (2.5 MG/3ML) 0.083% nebulizer solution Take 2.5 mg by nebulization every 6 (  six) hours as needed for wheezing or shortness of breath.     [provider]  amitriptyline (ELAVIL) 50 MG tablet Take 50 mg by mouth at bedtime.    [provider]  atorvastatin (LIPITOR) 40 MG tablet TAKE 1 TABLET DAILY 06/25/20   Adrian Prows, MD  budesonide-formoterol [facility]) 160-4.5 MCG/ACT inhaler Inhale 2 puffs into the lungs in the morning and at bedtime. 06/03/20   Adrian Prows, MD  folic acid (FOLVITE) 1 MG tablet Take 1 tablet (1 mg total) by mouth daily. 03/22/20   Pahwani, Michell Heinrich, MD  GLYXAMBI 10-5 MG TABS Take 1 tablet by mouth at bedtime. 04/09/19   [provider]  insulin glargine (LANTUS) 100 UNIT/ML injection Inject 46 Units into the skin daily after breakfast.     [provider]  insulin lispro (HUMALOG) 100 UNIT/ML injection Inject 3 Units into the skin 3 (three) times daily after meals.    [provider]   irbesartan (AVAPRO) 75 MG tablet Take 1 tablet (75 mg total) by mouth daily. Patient taking differently: Take 75 mg by mouth at bedtime. 04/24/14   Geradine Girt, DO  Multiple Vitamin (MULTIVITAMIN WITH MINERALS) TABS tablet Take 1 tablet by mouth daily. 03/22/20   Pahwani, Michell Heinrich, MD  omeprazole (PRILOSEC) 20 MG capsule Take 20 mg by mouth daily as needed (heartburn).    [provider]  rivaroxaban (XARELTO) 20 MG TABS tablet Take 1 tablet (20 mg total) by mouth daily with supper. 03/21/20   Pahwani, Michell Heinrich, MD  thiamine (VITAMIN B-1) 50 MG tablet Take 150 mg by mouth daily.    [provider]    ___________________________________________________________________________________________________ Physical Exam: Vitals with BMI 05/26/2021 05/26/2021 05/26/2021  Height - - -  Weight - - -  BMI - - -  Systolic [phone]  Diastolic 71 66 67  Pulse 88 87 87     1. General:  in No  Acute distress   Chronically ill   -appearing 2. Psychological: Alert and   Oriented 3. Head/ENT:  Dry Mucous Membranes                          Head Non traumatic, neck supple                        Poor Dentition 4. SKIN:  decreased Skin turgor,  Skin clean Dry and intact no rash 5. Heart: Regular rate and rhythm no  Murmur, no Rub or gallop 6. Lungs:  no wheezes or crackles   7. Abdomen: Soft,  non-tender, Non distended  bowel sounds present 8. Lower extremities: no clubbing, cyanosis, no  edema decrease cap refill right lower extremity poor pulses 9. Neurologically Grossly intact, moving all 4 extremities equally   10. MSK: Normal range of motion    Chart has been reviewed  ______________________________________________________________________________________________  Assessment/Plan 71 y.o. male with medical history significant of DM2. COPD, PAD, HTN, HLD  ethanol abuse, tobacco abuse , PE in 2021    Admitted for o claudication and occlusion of arterial supply in the right lower  extremity  Present on Admission:  Claudication of lower extremity ([place])  Peripheral artery disease ([place])  Essential hypertension  Tobacco abuse  COPD (chronic obstructive pulmonary disease) (HCC)  Alcohol abuse  Popliteal artery occlusion, right (HCC)     Alcohol abuse CIWA protocol Monitor for any signs of withdrawal  Claudication of lower  extremity ([place]) Pain control as needed patient is more responsive reporting ankle pain Plain imaging showed no evidence of fracture or joint fluid.  Possibly unrelated  COPD (chronic obstructive pulmonary disease) (HCC) Stable EKG was reported with complete smoking chest x-ray done  Peripheral artery disease (HCC) On heparin drip, vascular consulted  Diabetes mellitus, stable (HCC)  - Order Sensitive  SSI   -    Lantus decrease to 30 units,  -  check TSH and HgA1C      Essential hypertension Stable BP somewhat soft hold blood pressure medications for now resume when able  Tobacco abuse  - Spoke about importance of quitting spent 5 minutes discussing options for treatment, prior attempts at quitting, and dangers of smoking  -At this point patient is    NOT  interested in quitting  - order nicotine patch   - nursing tobacco cessation protocol   Popliteal artery occlusion, right ([place]) Admitted for angiogram in the morning vascular surgery is aware continue heparin infusion pain management as needed    Other plan as per orders.  DVT prophylaxis:  heparin   Code Status:    Code Status: Prior FULL CODE  per patient   I had personally discussed CODE STATUS with patient   I had spent *min discussing goals of care and CODE STATUS   Family Communication:   Family not at  Bedside    Disposition Plan:       To home once workup is complete and patient is stable   Following barriers for discharge:                                                     Will likely need home health, home O2, set up                           Will need  consultants to evaluate patient prior to discharge                        Transition of care consulted                                        Consults called: Vascular surgery is aware    Admission status:  ED Disposition     ED Disposition  [place]: [place] [[id number]]  Level of Care: Progressive [102]  Admit to Progressive based on following criteria: ACUTE MENTAL DISORDER-RELATED Drug/Alcohol Ingestion/Overdose/Withdrawal, Suicidal Ideation/attempt requiring safety sitter and < Q2h monitoring/assessments, moderate to severe agitation that is managed with medication/sitter, CIWA-Ar score < 20.  May place patient in observation at [facility] or [place] if equivalent level of care is available:: No  Covid Evaluation: Confirmed COVID Negative  Diagnosis: Claudication of lower extremity [facility]) [[id number]]  Admitting Physician: Toy Baker [[id number]]  Attending Physician: Toy Baker [[id number]]           Obs      Level of care    progressive tele indefinitely please discontinue once patient no longer qualifies COVID-19 Labs    Lab Results  Component Value Date   SARSCOV2NAA  NEGATIVE 05/26/2021     Precautions: admitted as   Covid Negative        Avneet Ashmore 05/26/2021, 11:17 PM    [facility]     after 2 AM please page floor coverage PA If 7AM-7PM, please contact the day team taking care of the patient using [email or web address]   Patient was evaluated in the context of the global COVID-19 pandemic, which necessitated consideration that the patient might be at risk for infection with the SARS-CoV-2 virus that causes COVID-19. Institutional protocols and algorithms that pertain to the evaluation of patients at risk for COVID-19 are in a state of rapid change based on information released by regulatory bodies including the CDC and federal and state organizations. These policies and algorithms were  followed during the patient's care.

## 2021-05-26 NOTE — ED Notes (Signed)
Patient transported to X-ray with RN ?

## 2021-05-26 NOTE — ED Provider Notes (Signed)
[facility] EMERGENCY DEPARTMENT Provider Note   CSN: [id number] Arrival date & time: 05/26/21  1558     History  Chief Complaint  Patient presents with   Leg Pain    Terry Munoz is a 71 y.o. male.  71 yo M with a chief complaints of right leg pain.  This been going on for about 3 days now.  Is felt like his leg is tingly and somewhat numb usually worse with him trying to ambulate.  He denies any trauma to the leg.  He had seen his family doctor for this and they had ordered an ultrasound.  He had this completed today and was told that he might have an arterial occlusion and was sent here for evaluation.  He states compliance with his Xarelto.  He is an everyday smoker.       Home Medications Prior to Admission medications   Medication Sig Start Date End Date Taking? Authorizing Provider  albuterol (PROVENTIL HFA;VENTOLIN HFA) 108 (90 Base) MCG/ACT inhaler Inhale 1-2 puffs into the lungs every 6 (six) hours as needed for wheezing or shortness of breath.    [provider]  albuterol (PROVENTIL) (2.5 MG/3ML) 0.083% nebulizer solution Take 2.5 mg by nebulization every 6 (six) hours as needed for wheezing or shortness of breath.     [provider]  amitriptyline (ELAVIL) 50 MG tablet Take 50 mg by mouth at bedtime.    [provider]  atorvastatin (LIPITOR) 40 MG tablet TAKE 1 TABLET DAILY 06/25/20   Adrian Prows, MD  budesonide-formoterol [facility]) 160-4.5 MCG/ACT inhaler Inhale 2 puffs into the lungs in the morning and at bedtime. 06/03/20   Adrian Prows, MD  folic acid (FOLVITE) 1 MG tablet Take 1 tablet (1 mg total) by mouth daily. 03/22/20   Pahwani, Michell Heinrich, MD  GLYXAMBI 10-5 MG TABS Take 1 tablet by mouth at bedtime. 04/09/19   [provider]  insulin glargine (LANTUS) 100 UNIT/ML injection Inject 46 Units into the skin daily after breakfast.     [provider]  insulin lispro (HUMALOG) 100 UNIT/ML injection Inject  3 Units into the skin 3 (three) times daily after meals.    [provider]  irbesartan (AVAPRO) 75 MG tablet Take 1 tablet (75 mg total) by mouth daily. Patient taking differently: Take 75 mg by mouth at bedtime. 04/24/14   Geradine Girt, DO  Multiple Vitamin (MULTIVITAMIN WITH MINERALS) TABS tablet Take 1 tablet by mouth daily. 03/22/20   Pahwani, Michell Heinrich, MD  omeprazole (PRILOSEC) 20 MG capsule Take 20 mg by mouth daily as needed (heartburn).    [provider]  rivaroxaban (XARELTO) 20 MG TABS tablet Take 1 tablet (20 mg total) by mouth daily with supper. 03/21/20   Pahwani, Michell Heinrich, MD  thiamine (VITAMIN B-1) 50 MG tablet Take 150 mg by mouth daily.    [provider]      Allergies    Sulfa antibiotics    Review of Systems   Review of Systems  Physical Exam Updated Vital Signs BP 109/71    Pulse 88    Temp 97.8 F (36.6 C) (Oral)    Resp (!) 22    Ht 5\' 8"  (1.727 m)    Wt 73.5 kg    SpO2 92%    BMI 24.63 kg/m  Physical Exam Vitals and nursing note reviewed.  Constitutional:      Appearance: He is well-developed.  HENT:  Head: Normocephalic and atraumatic.  Eyes:     Pupils: Pupils are equal, round, and reactive to light.  Neck:     Vascular: No JVD.  Cardiovascular:     Rate and Rhythm: Normal rate and regular rhythm.     Heart sounds: No murmur heard.   No friction rub. No gallop.     Comments: 2+ posterior tibialis pulses to the left lower extremity.  No palpable pulse to the right foot.  Foot is not significantly colder than the left except to the distal aspect and along the toes.  No obvious color change to the digits.  Femoral pulse 2+. Pulmonary:     Effort: No respiratory distress.     Breath sounds: No wheezing.  Abdominal:     General: There is no distension.     Tenderness: There is no abdominal tenderness. There is no guarding or rebound.  Musculoskeletal:        General: Normal range of motion.     Cervical back: Normal range  of motion and neck supple.  Skin:    Coloration: Skin is not pale.     Findings: No rash.  Neurological:     Mental Status: He is alert and oriented to person, place, and time.  Psychiatric:        Behavior: Behavior normal.    ED Results / Procedures / Treatments   Labs (all labs ordered are listed, but only abnormal results are displayed) Labs Reviewed  CBC WITH DIFFERENTIAL/PLATELET - Abnormal; Notable for the following components:      Result Value   Hemoglobin 17.1 (*)    All other components within normal limits  BASIC METABOLIC PANEL - Abnormal; Notable for the following components:   Glucose, Bld 158 (*)    Creatinine, Ser 1.36 (*)    GFR, Estimated 55 (*)    All other components within normal limits  RESP PANEL BY RT-PCR (FLU A&B, COVID) ARPGX2  APTT  HEPARIN LEVEL (UNFRACTIONATED)  CBC  CK  HEPATIC FUNCTION PANEL  MAGNESIUM  PHOSPHORUS  TSH  URINALYSIS, COMPLETE (UACMP) WITH MICROSCOPIC    EKG None  Radiology US Venous Img Lower Unilateral Right (DVT)  Result Date: 05/25/2021 CLINICAL DATA:  Right leg pain for 4 days EXAM: RIGHT LOWER EXTREMITY VENOUS DOPPLER ULTRASOUND TECHNIQUE: Gray-scale sonography with graded compression, as well as color Doppler and duplex ultrasound were performed to evaluate the lower extremity deep venous systems from the level of the common femoral vein and including the common femoral, femoral, profunda femoral, popliteal and calf veins including the posterior tibial, peroneal and gastrocnemius veins when visible. The superficial great saphenous vein was also interrogated. Spectral Doppler was utilized to evaluate flow at rest and with distal augmentation maneuvers in the common femoral, femoral and popliteal veins. COMPARISON:  None. FINDINGS: Contralateral Common Femoral Vein: Respiratory phasicity is normal and symmetric with the symptomatic side. No evidence of thrombus. Normal compressibility. Common Femoral Vein: No evidence of  thrombus. Normal compressibility, respiratory phasicity and response to augmentation. Saphenofemoral Junction: No evidence of thrombus. Normal compressibility and flow on color Doppler imaging. Profunda Femoral Vein: No evidence of thrombus. Normal compressibility and flow on color Doppler imaging. Femoral Vein: No evidence of thrombus. Normal compressibility, respiratory phasicity and response to augmentation. Popliteal Vein: No evidence of thrombus. Normal compressibility, respiratory phasicity and response to augmentation. Calf Veins: Limited assessment of the calf veins. Posterior tibial vein appears grossly patent. Peroneal vein not visualized. Superficial Great Saphenous Vein: No evidence  of thrombus. Normal compressibility. IMPRESSION: No significant right lower extremity DVT by ultrasound. Limited visualization of the calf veins. Electronically Signed   By: Jerilynn Mages.  Shick M.D.   On: 05/25/2021 11:00   US ARTERIAL SEG MULTIPLE LE (ABI, SEGMENTAL PRESSURES, PVR'S)  Addendum Date: 05/26/2021   ADDENDUM REPORT: 05/26/2021 14:57 ADDENDUM: These results were called by telephone at the time of interpretation on 05/26/2021 at 2:57 pm to provider Bing Matter , who verbally acknowledged these results. Electronically Signed   By: Markus Daft M.D.   On: 05/26/2021 14:57   Result Date: 05/26/2021 CLINICAL DATA:  71 year old with severe claudication. History of revascularization in the right lower extremity. EXAM: NONINVASIVE PHYSIOLOGIC VASCULAR STUDY OF BILATERAL LOWER EXTREMITIES TECHNIQUE: Non-invasive vascular evaluation of both lower extremities was performed at rest, including calculation of ankle-brachial indices, multiple segmental pressure evaluation, segmental Doppler and segmental pulse volume recording. COMPARISON:  Report from 12/17/2015 FINDINGS: Right Lower Extremity Resting ABI: 0 Resting TBI: 0 Segmental Pressures: No segmental pressures obtained. Great toe pressure: No signal Arterial Waveforms:  Monophasic and biphasic waveforms in the right femoral segment. Monophasic waveforms at the right popliteal artery. No signal identified at the posterior tibial artery or anterior tibial artery. PVRs: Abnormal waveforms in the right calf and ankle. Left Lower Extremity: Resting ABI: 1.02 Resting TBI: 0.74 Segmental Pressures: Normal segmental pressures, no significant (20 mmHg) pressure gradient between adjacent segments. Great toe pressure: 95 Arterial Waveforms: Biphasic and triphasic Doppler waveforms in the left lower extremity. PVRs: Normal PVRs with maintained waveform amplitude, augmentation and quality. Other: Symmetric upper extremity pressures. Ankle Brachial index > 1.4 Non diagnostic secondary to incompressible vessel calcifications 1.0-1.4       Normal 0.9-0.99     Borderline PAD 0.8-0.89     Mild PAD 0.5-0.79     Moderate PAD < 0.5          Severe PAD Toe Brachial Index Normal     >0.65 Moderate  0.53-0.64 Severe     <0.23 Toe Pressures Absolute toe pressure >33mmHg sufficient for wound healing. Toe pressures <22mmHg = critical limb ischemia. IMPRESSION: 1. Absent pulses at the right ankle and could not obtain a right ankle-brachial index. Findings are suggestive for arterial occlusion in the right lower extremity. 2. Left ABI is 1.02. Electronically Signed: By: Markus Daft M.D. On: 05/26/2021 14:48    Procedures Procedures     Medications Ordered in ED Medications  LORazepam (ATIVAN) injection 0-4 mg (0 mg Intravenous Not Given 05/26/21 1829)    Or  LORazepam (ATIVAN) tablet 0-4 mg ( Oral See Alternative 05/26/21 1829)  LORazepam (ATIVAN) injection 0-4 mg (has no administration in time range)    Or  LORazepam (ATIVAN) tablet 0-4 mg (has no administration in time range)  thiamine tablet 100 mg (100 mg Oral Given 05/26/21 1815)    Or  thiamine (B-1) injection 100 mg ( Intravenous See Alternative 05/26/21 1815)  heparin ADULT infusion 100 units/mL (25000 units/222mL) (1,150 Units/hr Intravenous  New Bag/Given 05/26/21 1828)  nicotine (NICODERM CQ - dosed in mg/24 hours) patch 21 mg (21 mg Transdermal Patch Applied 05/26/21 1835)  chlordiazePOXIDE (LIBRIUM) capsule 50 mg (50 mg Oral Given 05/26/21 1815)    ED Course/ Medical Decision Making/ A&P                           Medical Decision Making Amount and/or Complexity of Data Reviewed Labs: ordered.  Risk OTC drugs. Prescription drug  management. Decision regarding hospitalization.   71 yo M with a chief complaints of pain to his right leg.  He has a history of a femoropopliteal bypass done by Dr. Bridgett Larsson in 2017 on my record review.  He states compliance with his Xarelto.  Continues to be an everyday smoker.  He had seen his family doctor and had an ultrasound arterial study that was done today that was concerning for no pulse found in the foot.  He was then sent to the ED for evaluation.  Will discuss with vascular.  Obtain basic blood work.  Patient was seen by Dr. Standley Dakins, vascular plan to do an angiogram tomorrow.  Requesting medical admission with the patient having a history of alcohol withdrawal delirium as well as endorsing having a couple drinks today at lunch.  Will discuss with medicine.  CRITICAL CARE Performed by: Cecilio Asper   Total critical care time: 35 minutes  Critical care time was exclusive of separately billable procedures and treating other patients.  Critical care was necessary to treat or prevent imminent or life-threatening deterioration.  Critical care was time spent personally by me on the following activities: development of treatment plan with patient and/or surrogate as well as nursing, discussions with consultants, evaluation of patient's response to treatment, examination of patient, obtaining history from patient or surrogate, ordering and performing treatments and interventions, ordering and review of laboratory studies, ordering and review of radiographic studies, pulse oximetry and  re-evaluation of patient's condition.  The patients results and plan were reviewed and discussed.   Any x-rays performed were independently reviewed by myself.   Differential diagnosis were considered with the presenting HPI.  Medications  LORazepam (ATIVAN) injection 0-4 mg (0 mg Intravenous Not Given 05/26/21 1829)    Or  LORazepam (ATIVAN) tablet 0-4 mg ( Oral See Alternative 05/26/21 1829)  LORazepam (ATIVAN) injection 0-4 mg (has no administration in time range)    Or  LORazepam (ATIVAN) tablet 0-4 mg (has no administration in time range)  thiamine tablet 100 mg (100 mg Oral Given 05/26/21 1815)    Or  thiamine (B-1) injection 100 mg ( Intravenous See Alternative 05/26/21 1815)  heparin ADULT infusion 100 units/mL (25000 units/216mL) (1,150 Units/hr Intravenous New Bag/Given 05/26/21 1828)  nicotine (NICODERM CQ - dosed in mg/24 hours) patch 21 mg (21 mg Transdermal Patch Applied 05/26/21 1835)  chlordiazePOXIDE (LIBRIUM) capsule 50 mg (50 mg Oral Given 05/26/21 1815)    Vitals:   05/26/21 1725 05/26/21 1730 05/26/21 1745 05/26/21 1815  BP: 104/63 106/67 100/66 109/71  Pulse: 91 87 87 88  Resp:  (!) 21 15 (!) 22  Temp:      TempSrc:      SpO2:  92% 92% 92%  Weight:      Height:        Final diagnoses:  Claudication ([place])    Admission/ observation were discussed with the admitting physician, patient and/or family and they are comfortable with the plan.         Final Clinical Impression(s) / ED Diagnoses Final diagnoses:  Claudication [facility])    Rx / DC Orders ED Discharge Orders     None         Deno Etienne, DO 05/26/21 1916

## 2021-05-26 NOTE — Assessment & Plan Note (Signed)
Pain control as needed patient is more responsive reporting ankle pain ?Plain imaging showed no evidence of fracture or joint fluid.  Possibly unrelated ?

## 2021-05-26 NOTE — Subjective & Objective (Signed)
Reports his right ankle started to bother him it hurts to step on his foot no injury, no twisting ?No swelling ?Only hurts when he walks on it, he has not taken anything for it ? ? ? ?

## 2021-05-26 NOTE — Assessment & Plan Note (Signed)
this patient has acute respiratory failure with Hypoxia  as documented by the presence of following: ?O2 saturatio< 90% on RA while talking to the patient in the room noted that oxygen saturation went down to 89% started on 2 L ? ?Likely due to:   COPD Provide O2 therapy and titrate as needed ? Continuous pulse ox ? check Pulse ox with ambulation prior to discharge ? may need  TC consult for home O2 set up ?  ?

## 2021-05-26 NOTE — Consult Note (Signed)
[facility] [place]  ASSESSMENT / PLAN: 71 y.o. male with acute on chronic right lower extremity ischemia causing short distance claudication, which is new. Patient's motor / sensory function intact in the foot. Recommend heparin infusion. Recommend discussion with medicine for admission. Plan angiogram tomorrow with me. NPO at midnight. Orders for consent written.  CHIEF COMPLAINT: abnormal non-invasive vascular study  HISTORY OF PRESENT ILLNESS: Terry Munoz is a 71 y.o. male referred to ER for evaluation of abnormal noninvasive vascular studies.  The patient has a strong personal history of peripheral vascular disease.  He had superficial femoral artery stents placed in early 2017.  These acutely thrombosed causing acute limb ischemia.  Dr. Bridgett Larsson took him for thrombectomy of the right lower extremity from a popliteal approach, and performed endarterectomy of the popliteal and tibial vessels.  A saphenous vein patch was used.  The patient reports no preceding symptoms of chronic ischemia to me. He did report some mild claudication symptoms to Dr. Einar Gip about a year ago. He reports short distance claudication began several days ago.  He has been compliant with Xarelto.   VASCULAR SURGICAL HISTORY:  Right SFA stenting (2016 by cardiology)  Right below knee popliteal and tibial thrombectomy and calf fasciotomy (Dr. Bridgett Larsson 07/24/15)  Patient lost to follow up after 12/17/15  VASCULAR RISK FACTORS: Negative history of stroke / transient ischemic attack. Negative history of coronary artery disease.  Positive history of diabetes mellitus. Last A1c 7.7. Positive history of smoking. + actively smoking. Positive history of hypertension.  Negative history of chronic kidney disease.  Last GFR >60.  Positive history of chronic obstructive pulmonary disease.  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   Hyperlipidemia 10/04/2019   Hypertension    Hyponatremia 03/2014   Insomnia    Kidney stones    Mouth ulcers    Osteoarthritis    Otitis media    Peripheral vascular disease ([facility])    Sinusitis     Past Surgical History:  Procedure Laterality Date   DENTAL SURGERY     FEMORAL-POPLITEAL BYPASS GRAFT Right 07/24/2015   Procedure: Thrombectomy Right Femoral-Popliteal Artery; Thrombectomy Right Anterior-Tibial Artery, Thrombectomy Right Tibial-Peroneal Trunk; Bovine Patch Angioplasty Right Popliteal Artery; Endarterectomy Right Anterior Tibial Artery and Right Tibial-Peroneal Trunk; Right Leg Angiogram, Four Compartment Fasciotomy Right Lower Leg;  Surgeon: Conrad Elrama, MD;  Location: [facility];  Service: Vascular;  L   HERNIA REPAIR     lithotomy     LOWER EXTREMITY ANGIOGRAM N/A 05/01/2014   Procedure: LOWER EXTREMITY ANGIOGRAM;  Surgeon: Laverda Page, MD;  Location: [facility] CATH LAB;  Service: Cardiovascular;  Laterality: N/A;   NECK SURGERY     POPLITEAL ARTERY ANGIOPLASTY Right 05/02/2014   to mid sfa   THROMBECTOMY  05/02/2014    Family History  Problem Relation Age of Onset   Heart disease Mother    Hypertension Mother    Hypertension Father    Hypertension Sister    Hypertension Brother    Diabetes Brother    Hypertension Sister     Social History   Socioeconomic History   Marital status: Married    Spouse name: Not on file  Number of children: 1   Years of education: Not on file   Highest education level: Not on file  Occupational History   Not on file  Tobacco Use   Smoking status: Every Day    Packs/day: 2.00    Years: 45.00    Pack years: 90.00    Types: Cigarettes   Smokeless tobacco: Never  Vaping Use   Vaping  Use: Some days  Substance and Sexual Activity   Alcohol use: Yes    Alcohol/week: 24.0 standard drinks    Types: 24 Cans of beer per week    Comment: daily   Drug use: No   Sexual activity: Not on file  Other Topics Concern   Not on file  Social History Narrative   Not on file   Social Determinants of Health   Financial Resource Strain: Not on file  Food Insecurity: Not on file  Transportation Needs: Not on file  Physical Activity: Not on file  Stress: Not on file  Social Connections: Not on file  Intimate Partner Violence: Not on file    Allergies  Allergen Reactions   Sulfa Antibiotics Hives    No current facility-administered medications for this encounter.   Current Outpatient Medications  Medication Sig Dispense Refill   albuterol (PROVENTIL HFA;VENTOLIN HFA) 108 (90 Base) MCG/ACT inhaler Inhale 1-2 puffs into the lungs every 6 (six) hours as needed for wheezing or shortness of breath.     albuterol (PROVENTIL) (2.5 MG/3ML) 0.083% nebulizer solution Take 2.5 mg by nebulization every 6 (six) hours as needed for wheezing or shortness of breath.      amitriptyline (ELAVIL) 50 MG tablet Take 50 mg by mouth at bedtime.     atorvastatin (LIPITOR) 40 MG tablet TAKE 1 TABLET DAILY 90 tablet 3   budesonide-formoterol (SYMBICORT) 160-4.5 MCG/ACT inhaler Inhale 2 puffs into the lungs in the morning and at bedtime. 3 each 12   folic acid (FOLVITE) 1 MG tablet Take 1 tablet (1 mg total) by mouth daily. 30 tablet 0   GLYXAMBI 10-5 MG TABS Take 1 tablet by mouth at bedtime.     insulin glargine (LANTUS) 100 UNIT/ML injection Inject 46 Units into the skin daily after breakfast.      insulin lispro (HUMALOG) 100 UNIT/ML injection Inject 3 Units into the skin 3 (three) times daily after meals.     irbesartan (AVAPRO) 75 MG tablet Take 1 tablet (75 mg total) by mouth daily. (Patient taking differently: Take 75 mg by mouth at bedtime.) 30 tablet 0   Multiple Vitamin (MULTIVITAMIN WITH  MINERALS) TABS tablet Take 1 tablet by mouth daily.     omeprazole (PRILOSEC) 20 MG capsule Take 20 mg by mouth daily as needed (heartburn).     rivaroxaban (XARELTO) 20 MG TABS tablet Take 1 tablet (20 mg total) by mouth daily with supper. 30 tablet 0   thiamine (VITAMIN B-1) 50 MG tablet Take 150 mg by mouth daily.      PHYSICAL EXAM Vitals:   05/26/21 1633 05/26/21 1645 05/26/21 1654  BP: 112/72 113/67   Pulse: (!) 106 99   Resp: 17 18   Temp: 97.8 F (36.6 C)    TempSrc: Oral    SpO2: 97% 94%   Weight:   73.5 kg  Height:   5\' 8"  (1.727 m)    Constitutional: Chronically ill appearing. no distress. Appears well nourished.  Neurologic: CN intact. no focal findings. no sensory loss. Able to move foot / ankle. Able  to lift legs off bed. Psychiatric:  Mood and affect symmetric and appropriate. Eyes:  No icterus. No conjunctival pallor. Ears, nose, throat:  mucous membranes moist. Midline trachea.  Cardiac: regular rate and rhythm.  Respiratory:  unlabored. Abdominal:  soft, non-tender, non-distended.  Peripheral vascular: 2+ femoral pulses bilaterally. No palpable pedal pulses on the right. Trace pulse in left DP. Scarring RLE consistent with prior thrombectomy. Extremity: No edema. No cyanosis. No pallor.  Skin: No gangrene. No ulceration.  Lymphatic: No Stemmer's sign. No palpable lymphadenopathy.  PERTINENT LABORATORY AND RADIOLOGIC DATA  Most recent CBC CBC Latest Ref Rng & Units 03/21/2020 03/19/2020 10/04/2019  WBC 4.0 - 10.5 K/uL 7.0 7.0 7.2  Hemoglobin 13.0 - 17.0 g/dL 14.1 14.9 15.3  Hematocrit 39.0 - 52.0 % 40.5 42.2 45.3  Platelets 150 - 400 K/uL 139(L) 122(L) 170     Most recent CMP CMP Latest Ref Rng & Units 03/21/2020 03/19/2020 10/04/2019  Glucose 70 - 99 mg/dL 76 123(H) 187(H)  BUN 8 - 23 mg/dL 14 16 15   Creatinine 0.61 - 1.24 mg/dL 1.07 1.28(H) 1.10  Sodium 135 - 145 mmol/L 132(L) 131(L) 136  Potassium 3.5 - 5.1 mmol/L 3.4(L) 4.1 3.7  Chloride 98 - 111  mmol/L 96(L) 94(L) 98  CO2 22 - 32 mmol/L 24 25 29   Calcium 8.9 - 10.3 mg/dL 8.6(L) 8.9 8.7(L)  Total Protein 6.5 - 8.1 g/dL 6.6 7.0 7.0  Total Bilirubin 0.3 - 1.2 mg/dL 0.8 1.2 0.9  Alkaline Phos 38 - 126 U/L 54 65 74  AST 15 - 41 U/L 43(H) 35 26  ALT 0 - 44 U/L 31 28 24     Renal function CrCl cannot be calculated (Patient's most recent lab result is older than the maximum 21 days allowed.).  Hgb A1c MFr Bld (%)  Date Value  03/20/2020 7.7 (H)    LDL Calculated  Date Value Ref Range Status  11/21/2018 62 0 - 99 mg/dL Final    Comment:    **Effective November 25, 2018, [facility] is implementing an improved** equation to calculate Low Density Lipoprotein Cholesterol (LDL-C) concentrations, to be used in all lipid panels that report calculated LDL-C. This equation was developed through a collaboration with the [employer], Lung and [place] (NIH).[1] The NIH calculation overcomes the limitations of the existing [place] LDL-C equation and performs equally well in both fasting and non-fasting individuals. 1. Pauline Good Q, et al. A new equation for calculation of low-density lipoprotein cholesterol in patients with normolipidemia and/or hypertriglyceridemia. JAMA Cardiol. 2020 Feb 26. doi:10.1001/jamacardio.2020.0013      Vascular Imaging: ABI today  RLE 0, toe pressure 0 LLE 1.02, TP 95  Daymion Nazaire N. Stanford Breed, MD [facility] Phone Number: [phone] 05/26/2021 5:03 PM  Total time spent on preparing this encounter including chart review, data review, collecting history, examining the patient, coordinating care for this new patient, 60 minutes.  Portions of this report may have been transcribed using voice recognition software.  Every effort has been made to ensure accuracy; however, inadvertent computerized transcription errors may still be present.

## 2021-05-26 NOTE — Assessment & Plan Note (Signed)
Stable EKG was reported with complete smoking chest x-ray done ?

## 2021-05-27 ENCOUNTER — Encounter (HOSPITAL_COMMUNITY)
Admission: EM | Disposition: A | Discharge status: Home / Self Care | Payer: Self-pay | Source: Home / Self Care | Attending: Internal Medicine

## 2021-05-27 DIAGNOSIS — I739 Peripheral vascular disease, unspecified: Secondary | ICD-10-CM | POA: Diagnosis present

## 2021-05-27 DIAGNOSIS — I70211 Atherosclerosis of native arteries of extremities with intermittent claudication, right leg: Secondary | ICD-10-CM | POA: Diagnosis not present

## 2021-05-27 DIAGNOSIS — M25571 Pain in right ankle and joints of right foot: Secondary | ICD-10-CM | POA: Diagnosis not present

## 2021-05-27 DIAGNOSIS — E1151 Type 2 diabetes mellitus with diabetic peripheral angiopathy without gangrene: Secondary | ICD-10-CM | POA: Diagnosis not present

## 2021-05-27 HISTORY — PX: LOWER EXTREMITY ANGIOGRAPHY: CATH118251

## 2021-05-27 LAB — POCT ACTIVATED CLOTTING TIME: Activated Clotting Time: 161 seconds

## 2021-05-27 LAB — MAGNESIUM: Magnesium: 2.1 mg/dL (ref 1.7–2.4)

## 2021-05-27 LAB — URINALYSIS, COMPLETE (UACMP) WITH MICROSCOPIC
Bacteria, UA: NONE SEEN
Bilirubin Urine: NEGATIVE
Glucose, UA: >=500 mg/dL — AB
Hgb urine dipstick: NEGATIVE
Ketones, ur: 5 mg/dL — AB
Leukocytes,Ua: NEGATIVE
Nitrite: NEGATIVE
Protein, ur: NEGATIVE mg/dL
Specific Gravity, Urine: 1.015 (ref 1.005–1.030)
pH: 5 (ref 5.0–8.0)

## 2021-05-27 LAB — CBC WITH DIFFERENTIAL/PLATELET
Abs Immature Granulocytes: 0.03 10*3/uL (ref 0.00–0.07)
Basophils Absolute: 0 10*3/uL (ref 0.0–0.1)
Basophils Relative: 0 %
Eosinophils Absolute: 0.3 10*3/uL (ref 0.0–0.5)
Eosinophils Relative: 3 %
HCT: 46.7 % (ref 39.0–52.0)
Hemoglobin: 15.7 g/dL (ref 13.0–17.0)
Immature Granulocytes: 0 %
Lymphocytes Relative: 15 %
Lymphs Abs: 1.5 10*3/uL (ref 0.7–4.0)
MCH: 33.8 pg (ref 26.0–34.0)
MCHC: 33.6 g/dL (ref 30.0–36.0)
MCV: 100.6 fL — ABNORMAL HIGH (ref 80.0–100.0)
Monocytes Absolute: 1 10*3/uL (ref 0.1–1.0)
Monocytes Relative: 9 %
Neutro Abs: 7.3 10*3/uL (ref 1.7–7.7)
Neutrophils Relative %: 73 %
Platelets: 181 10*3/uL (ref 150–400)
RBC: 4.64 MIL/uL (ref 4.22–5.81)
RDW: 12.7 % (ref 11.5–15.5)
WBC: 10.1 10*3/uL (ref 4.0–10.5)
nRBC: 0 % (ref 0.0–0.2)

## 2021-05-27 LAB — COMPREHENSIVE METABOLIC PANEL
ALT: 25 U/L (ref 0–44)
AST: 26 U/L (ref 15–41)
Albumin: 3.5 g/dL (ref 3.5–5.0)
Alkaline Phosphatase: 66 U/L (ref 38–126)
Anion gap: 9 (ref 5–15)
BUN: 16 mg/dL (ref 8–23)
CO2: 26 mmol/L (ref 22–32)
Calcium: 9 mg/dL (ref 8.9–10.3)
Chloride: 103 mmol/L (ref 98–111)
Creatinine, Ser: 1.23 mg/dL (ref 0.61–1.24)
GFR, Estimated: >60 mL/min (ref >60)
Glucose, Bld: 60 mg/dL — ABNORMAL LOW (ref 70–99)
Potassium: 3.7 mmol/L (ref 3.5–5.1)
Sodium: 138 mmol/L (ref 135–145)
Total Bilirubin: 0.6 mg/dL (ref 0.3–1.2)
Total Protein: 6.5 g/dL (ref 6.5–8.1)

## 2021-05-27 LAB — APTT: aPTT: 123 seconds — ABNORMAL HIGH (ref 24–36)

## 2021-05-27 LAB — CBG MONITORING, ED
Glucose-Capillary: 66 mg/dL — ABNORMAL LOW (ref 70–99)
Glucose-Capillary: 83 mg/dL (ref 70–99)
Glucose-Capillary: 90 mg/dL (ref 70–99)

## 2021-05-27 LAB — GLUCOSE, CAPILLARY
Glucose-Capillary: 106 mg/dL — ABNORMAL HIGH (ref 70–99)
Glucose-Capillary: 63 mg/dL — ABNORMAL LOW (ref 70–99)

## 2021-05-27 LAB — HEPARIN LEVEL (UNFRACTIONATED): Heparin Unfractionated: 0.98 IU/mL — ABNORMAL HIGH (ref 0.30–0.70)

## 2021-05-27 LAB — PHOSPHORUS: Phosphorus: 4.5 mg/dL (ref 2.5–4.6)

## 2021-05-27 SURGERY — LOWER EXTREMITY ANGIOGRAPHY
Anesthesia: LOCAL

## 2021-05-27 MED ORDER — SODIUM CHLORIDE 0.9 % IV SOLN: 75.0000 mL/h | INTRAVENOUS | Status: DC

## 2021-05-27 MED ORDER — SODIUM CHLORIDE 0.9% FLUSH: 3.0000 mL | INTRAVENOUS | Status: DC | PRN

## 2021-05-27 MED ORDER — THIAMINE HCL 100 MG PO TABS: 150.0000 mg | ORAL_TABLET | Freq: Every day | ORAL | Status: DC

## 2021-05-27 MED ORDER — LIDOCAINE HCL (PF) 1 % IJ SOLN
INTRAMUSCULAR | Status: AC
  Filled 2021-05-27: qty 30

## 2021-05-27 MED ORDER — INSULIN GLARGINE-YFGN 100 UNIT/ML ~~LOC~~ SOLN
20.0000 [IU] | Freq: Every day | SUBCUTANEOUS | Status: DC
Start: 2021-05-27 — End: 2021-05-27

## 2021-05-27 MED ORDER — ACETAMINOPHEN 325 MG PO TABS: 650.0000 mg | ORAL_TABLET | Freq: Four times a day (QID) | ORAL | Status: DC | PRN

## 2021-05-27 MED ORDER — MIDAZOLAM HCL 2 MG/2ML IJ SOLN
INTRAMUSCULAR | Status: AC
  Filled 2021-05-27: qty 2

## 2021-05-27 MED ORDER — ONDANSETRON HCL 4 MG/2ML IJ SOLN: 4.0000 mg | Freq: Four times a day (QID) | INTRAMUSCULAR | Status: DC | PRN

## 2021-05-27 MED ORDER — LIDOCAINE HCL (PF) 1 % IJ SOLN
INTRAMUSCULAR | Status: DC | PRN
  Administered 2021-05-27: 10 mL

## 2021-05-27 MED ORDER — MIDAZOLAM HCL 2 MG/2ML IJ SOLN
INTRAMUSCULAR | Status: DC | PRN
  Administered 2021-05-27: 1 mg via INTRAVENOUS

## 2021-05-27 MED ORDER — HYDRALAZINE HCL 20 MG/ML IJ SOLN: 5.0000 mg | INTRAMUSCULAR | Status: DC | PRN

## 2021-05-27 MED ORDER — ACETAMINOPHEN 650 MG RE SUPP: 650.0000 mg | Freq: Four times a day (QID) | RECTAL | Status: DC | PRN

## 2021-05-27 MED ORDER — INSULIN GLARGINE-YFGN 100 UNIT/ML ~~LOC~~ SOLN: 30.0000 [IU] | Freq: Every day | SUBCUTANEOUS | Status: DC

## 2021-05-27 MED ORDER — HEPARIN (PORCINE) IN NACL 1000-0.9 UT/500ML-% IV SOLN
INTRAVENOUS | Status: DC | PRN
  Administered 2021-05-27 (×4): 500 mL

## 2021-05-27 MED ORDER — LABETALOL HCL 5 MG/ML IV SOLN: 10.0000 mg | INTRAVENOUS | Status: DC | PRN

## 2021-05-27 MED ORDER — HEPARIN (PORCINE) IN NACL 1000-0.9 UT/500ML-% IV SOLN
INTRAVENOUS | Status: AC
  Filled 2021-05-27: qty 1000

## 2021-05-27 MED ORDER — SODIUM CHLORIDE 0.9 % WEIGHT BASED INFUSION: 1.0000 mL/kg/h | INTRAVENOUS | Status: DC

## 2021-05-27 MED ORDER — ALBUTEROL SULFATE (2.5 MG/3ML) 0.083% IN NEBU: 2.5000 mg | INHALATION_SOLUTION | RESPIRATORY_TRACT | Status: DC | PRN

## 2021-05-27 MED ORDER — SODIUM CHLORIDE 0.9 % IV SOLN: 250.0000 mL | INTRAVENOUS | Status: DC | PRN

## 2021-05-27 MED ORDER — AMITRIPTYLINE HCL 50 MG PO TABS: 50.0000 mg | ORAL_TABLET | Freq: Every day | ORAL | Status: DC

## 2021-05-27 MED ORDER — ALBUTEROL SULFATE (2.5 MG/3ML) 0.083% IN NEBU: 2.5000 mg | INHALATION_SOLUTION | Freq: Four times a day (QID) | RESPIRATORY_TRACT | Status: DC | PRN

## 2021-05-27 MED ORDER — DEXTROSE-NACL 5-0.9 % IV SOLN: INTRAVENOUS | Status: DC

## 2021-05-27 MED ORDER — IODIXANOL 320 MG/ML IV SOLN
INTRAVENOUS | Status: DC | PRN
  Administered 2021-05-27: 85 mL

## 2021-05-27 MED ORDER — HYDROCODONE-ACETAMINOPHEN 5-325 MG PO TABS: 1.0000 | ORAL_TABLET | ORAL | Status: DC | PRN

## 2021-05-27 MED ORDER — SODIUM CHLORIDE 0.9 % IV SOLN: INTRAVENOUS | Status: DC

## 2021-05-27 MED ORDER — ATORVASTATIN CALCIUM 40 MG PO TABS: 40.0000 mg | ORAL_TABLET | Freq: Every day | ORAL | Status: DC

## 2021-05-27 MED ORDER — FENTANYL CITRATE (PF) 100 MCG/2ML IJ SOLN
INTRAMUSCULAR | Status: AC
  Filled 2021-05-27: qty 2

## 2021-05-27 MED ORDER — MOMETASONE FURO-FORMOTEROL FUM 200-5 MCG/ACT IN AERO: 2.0000 | INHALATION_SPRAY | Freq: Two times a day (BID) | RESPIRATORY_TRACT | Status: DC

## 2021-05-27 MED ORDER — FOLIC ACID 1 MG PO TABS: 1.0000 mg | ORAL_TABLET | Freq: Every day | ORAL | Status: DC

## 2021-05-27 MED ORDER — FENTANYL CITRATE (PF) 100 MCG/2ML IJ SOLN
INTRAMUSCULAR | Status: DC | PRN
  Administered 2021-05-27: 50 ug via INTRAVENOUS

## 2021-05-27 MED ORDER — ACETAMINOPHEN 325 MG PO TABS: 650.0000 mg | ORAL_TABLET | ORAL | Status: DC | PRN

## 2021-05-27 MED ORDER — SODIUM CHLORIDE 0.9% FLUSH: 3.0000 mL | Freq: Two times a day (BID) | INTRAVENOUS | Status: DC

## 2021-05-27 MED ORDER — HEPARIN (PORCINE) 25000 UT/250ML-% IV SOLN
950.0000 [IU]/h | INTRAVENOUS | Status: DC
Start: 2021-05-27 — End: 2021-05-27
  Administered 2021-05-27: 950 [IU]/h via INTRAVENOUS

## 2021-05-27 SURGICAL SUPPLY — 9 items
CATH OMNI FLUSH 5F 65CM (CATHETERS) ×1 IMPLANT
KIT MICROPUNCTURE NIT STIFF (SHEATH) ×1 IMPLANT
KIT PV (KITS) ×2 IMPLANT
SHEATH PINNACLE 5F 10CM (SHEATH) ×1 IMPLANT
SHEATH PROBE COVER 6X72 (BAG) ×1 IMPLANT
SYR MEDRAD MARK 7 150ML (SYRINGE) ×2 IMPLANT
TRANSDUCER W/STOPCOCK (MISCELLANEOUS) ×2 IMPLANT
TRAY PV CATH (CUSTOM PROCEDURE TRAY) ×2 IMPLANT
WIRE BENTSON .035X145CM (WIRE) ×1 IMPLANT

## 2021-05-27 NOTE — Op Note (Signed)
DATE OF SERVICE: 05/27/2021 ? ?PATIENT:  Terry Munoz  71 y.o. male ? ?PRE-OPERATIVE DIAGNOSIS:  Atherosclerosis of native arteries of right lower extremity causing rapid onset claudication; concern for subacute ischemia ? ?POST-OPERATIVE DIAGNOSIS:  Same ? ?PROCEDURE:   ?1) US guided left common femoral artery access ?2) Aortogram ?3) Right lower extremity angiogram with second order cannulation (73mL total contrast) ?4) Conscious sedation (26 minutes) ? ?SURGEON:  Yevonne Aline. Stanford Breed, MD ? ?ASSISTANT: none ? ?ANESTHESIA:   local and IV sedation ? ?ESTIMATED BLOOD LOSS: minimal ? ?LOCAL MEDICATIONS USED:  LIDOCAINE  ? ?COUNTS: confirmed correct. ? ?PATIENT DISPOSITION:  PACU - hemodynamically stable. ?  ?Delay start of Pharmacological VTE agent (>24hrs) due to surgical blood loss or risk of bleeding: no ? ?INDICATION FOR PROCEDURE: Terry Munoz is a 71 y.o. male with rapid onset right lower extremity claudication concerning for subacute limb ischemia. After careful discussion of risks, benefits, and alternatives the patient was offered angiogram. The patient understood and wished to proceed. ? ?OPERATIVE FINDINGS:  ?Terminal aorta and iliac arteries: ?Widely patent aorta. ?Widely patent renal arteries ?No flow limiting stenosis in terminal aorta or iliac arteries ? ?Right lower extremity: ?Common femoral artery: widely patent  ?Profunda femoris artery: widely patent  ?Superficial femoral artery: previous stenting. Proximal artery patent. Stents chronically occluded. ?Popliteal artery: Heavily diseased throughout the artery; below knee artery >95% stenosis.  ?Anterior tibial artery: diseased at the origin. Dominant tibial - courses to foot.  ?Tibioperoneal trunk: patent ?Peroneal artery: patent to ankle; then arborizes.  ?Posterior tibial artery: occludes near its origin ?Pedal circulation: poorly visualized, possibly because of multilevel occlusion. AT fills the foot.  ? ?DESCRIPTION OF PROCEDURE: After  identification of the patient in the pre-operative holding area, the patient was transferred to the operating room. The patient was positioned supine on the operating room table. Anesthesia was induced. The groins was prepped and draped in standard fashion. A surgical pause was performed confirming correct patient, procedure, and operative location. ? ?The left groin was anesthetized with subcutaneous injection of 1% lidocaine. Using ultrasound guidance, the left common femoral artery was accessed with micropuncture technique. Fluoroscopy was used to confirm cannulation over the femoral head. The 74F sheath was upsized to 76F.  ? ?A Benson wire was advanced into the distal aorta. Over the wire an omni flush catheter was advanced to the level of L2. Aortogram was performed - see above for details. ? ?The right common iliac artery was selected with a guidewire. The wire was advanced into the common femoral artery. Over the wire the omni flush catheter was advanced into the external iliac artery. Selective angiography was performed - see above for details.  ? ?The sheath was left in place to be removed in PACU ? ?Conscious sedation was administered with the use of IV fentanyl and midazolam under continuous physician and nurse monitoring.  Heart rate, blood pressure, and oxygen saturation were continuously monitored.  Total sedation time was 26 minutes ? ?Upon completion of the case instrument and sharps counts were confirmed correct. The patient was transferred to the PACU in good condition. I was present for all portions of the procedure. ? ?PLAN: No evidence of acute limb ischemia.  The foot fills weekly via the anterior tibial artery.  No need for urgent intervention.  Patient presently is having short distance claudication.  We will plan observe this as an outpatient.  He should continue best medical therapy for PAD: Aspirin 81 mg daily, and high  intensity statin therapy.  He can follow-up with me in the clinic in  the next 1 to 2 weeks to monitor symptoms.  If he has clinical deterioration, I will offer him common femoral to anterior tibial artery bypass. ? ?Yevonne Aline. Stanford Breed, MD ?[facility] ?Office Phone Number: [phone] ?05/27/2021 12:23 PM ? ?

## 2021-05-27 NOTE — ED Notes (Signed)
Dr. Dahal at bedside. 

## 2021-05-27 NOTE — Discharge Summary (Signed)
Physician Discharge Summary  Terry Munoz WVP:[id number] DOB: Apr 22, 1950 DOA: 05/26/2021  PCP: Aletha Halim., PA-C  Admit date: 05/26/2021 Discharge date: 05/27/2021  Admitted From: Home Discharge disposition: Home  Recommendations at discharge:  Continue Xarelto, aspirin and statin as before Follow-up with vascular surgery as an outpatient Stop smoking, stop alcohol   Brief narrative: Terry Munoz is a 71 y.o. male with PMH significant for DM2, HTN, HLD, peripheral artery disease, COPD, osteoarthritis, nephrolithiasis, chronic alcoholism, drinks about 8 drinks a day, smokes 2 packs/day. Patient has a strong history of peripheral artery disease, he had femoral artery stents placed in 2017 which acutely thrombosed causing limb ischemia.  He underwent thrombectomy, endarterectomy and a saphenous venous patch.  Lately he has developed short distance claudication of right lower extremity despite compliance to medications including Xarelto On 3/2, patient was seen at vascular surgery office for the symptoms.  He was sent to ER with a plan to admit and do an angiogram following day.  Admitted under hospitalist service.   Subjective: Patient was seen and examined this morning in the ED.  Present elderly Caucasian male.  Propped up in bed.  Not in distress. He was waiting for angiogram.  Angiogram done later this morning.  Principal Problem:   Claudication of lower extremity (HCC) Active Problems:   Popliteal artery occlusion, right (HCC)   Peripheral artery disease (HCC)   Essential hypertension   COPD (chronic obstructive pulmonary disease) (HCC)   Alcohol abuse   Acute respiratory failure with hypoxia (HCC)   Tobacco abuse   Diabetes mellitus, stable ([place])   Peripheral arterial disease (HCC)   Assessment and Plan: Acute on chronic right limb ischemia Peripheral artery disease s/p prior stents -Presented with acute claudication of right lower extremity in the  background of chronic ischemia -Vascular surgery did angiogram today.  No evidence of acute limb ischemia.  No need for urgent intervention.  Noted a plan to follow-up as an outpatient.  Patient will continue Xarelto 20 mg daily, aspirin 81 mg daily and Lipitor 40 mg daily as before. -To follow-up with vascular surgery as an outpatient in 1 to 2 weeks.  Type 2 diabetes mellitus Hypoglycemia -A1c 7.7 on 05/26/2021 -Home meds include Lantus 30 units at bedtime, pre-meal sliding scale insulin, Jardiance 10 mg daily, linagliptin 5 mg daily. -This morning, patient had an episode of low blood sugar at 66 because of n.p.o. status after which he was started on D5 drip preoperatively.  Diet resumed postprocedure. -Continue the same regimen at home. Recent Labs  Lab 05/26/21 2344 05/27/21 0444 05/27/21 0624 05/27/21 0810 05/27/21 1346  GLUCAP 94 66* 83 90 63*   Essential hypertension -PTA, patient was not irbesartan 75 mg daily, -Resume the same.  COPD -Continue Symbicort,  Chronic alcoholism -Drinks daily.   -No withdrawal symptoms in the hospital -Counseled to quit -Continue PPI  Chronic smoker -Smokes 2 packs/day.  -Nicotine patch offered -Counseled to quit  Wounds:  -    Discharge Exam:   Vitals:   05/27/21 1308 05/27/21 1318 05/27/21 1328 05/27/21 1342  BP: (!) 147/88 136/66 (!) 142/79 (!) 143/81  Pulse: 77 74 78 86  Resp: (!) 23 15 18    Temp:      TempSrc:      SpO2: 91% (!) 89% (!) 88% 91%  Weight:      Height:        Body mass index is 24.63 kg/m.  General exam: Pleasant, elderly Caucasian male.  Propped up in bed.  Not in distress Skin: No rashes, lesions or ulcers. HEENT: Atraumatic, normocephalic, no obvious bleeding Lungs: Clear to auscultation bilaterally CVS: Regular rate and rhythm, no murmur GI/Abd soft, nontender, nondistended, bowel sound present CNS: Alert, awake, oriented x3 Psychiatry: Mood appropriate Extremities: No pedal edema, no calf  tenderness  Follow ups:    Follow-up Information     Aletha Halim., PA-C Follow up.   Specialty: Family Medicine Contact information: [address] [place] [address] [phone]                 Discharge Instructions:   Discharge Instructions     Call MD for:  difficulty breathing, headache or visual disturbances   Complete by: As directed    Call MD for:  extreme fatigue   Complete by: As directed    Call MD for:  hives   Complete by: As directed    Call MD for:  persistant dizziness or light-headedness   Complete by: As directed    Call MD for:  persistant nausea and vomiting   Complete by: As directed    Call MD for:  severe uncontrolled pain   Complete by: As directed    Call MD for:  temperature >100.4   Complete by: As directed    Diet general   Complete by: As directed    Discharge instructions   Complete by: As directed    Recommendations at discharge:   Continue Xarelto, aspirin and statin as before  Follow-up with vascular surgery as an outpatient  Stop smoking, stop alcohol  Discharge instructions for diabetes mellitus: Check blood sugar 3 times a day and bedtime at home. If blood sugar running above 200 or less than 70 please call your MD to adjust insulin. If you notice signs and symptoms of hypoglycemia (low blood sugar) like jitteriness, confusion, thirst, tremor and sweating, please check blood sugar, drink sugary drink/biscuits/sweets to increase sugar level and call MD or return to ER.    General discharge instructions: Follow with Primary MD Aletha Halim., PA-C in 7 days  Please request your PCP  to go over your hospital tests, procedures, radiology results at the follow up. Please get your medicines reviewed and adjusted.  Your PCP may decide to repeat certain labs or tests as needed. Do not drive, operate heavy machinery, perform activities at heights, swimming or participation in water activities or provide baby  sitting services if your were admitted for syncope or siezures until you have seen by Primary MD or a Neurologist and advised to do so again. [place] Controlled Substance Reporting System database was reviewed. Do not drive, operate heavy machinery, perform activities at heights, swim, participate in water activities or provide baby-sitting services while on medications for pain, sleep and mood until your outpatient physician has reevaluated you and advised to do so again.  You are strongly recommended to comply with the dose, frequency and duration of prescribed medications. Activity: As tolerated with Full fall precautions use walker/cane & assistance as needed Avoid using any recreational substances like cigarette, tobacco, alcohol, or non-prescribed drug. If you experience worsening of your admission symptoms, develop shortness of breath, life threatening emergency, suicidal or homicidal thoughts you must seek medical attention immediately by calling 911 or calling your MD immediately  if symptoms less severe. You must read complete instructions/literature along with all the possible adverse reactions/side effects for all the medicines you take and that have been prescribed to you.  Take any new medicine only after you have completely understood and accepted all the possible adverse reactions/side effects.  Wear Seat belts while driving. You were cared for by a hospitalist during your hospital stay. If you have any questions about your discharge medications or the care you received while you were in the hospital after you are discharged, you can call the unit and ask to speak with the hospitalist or the covering physician. Once you are discharged, your primary care physician will handle any further medical issues. Please note that NO REFILLS for any discharge medications will be authorized once you are discharged, as it is imperative that you return to your primary care physician (or establish a  relationship with a primary care physician if you do not have one).   Increase activity slowly   Complete by: As directed        Discharge Medications:   Allergies as of 05/27/2021       Reactions   Sulfa Antibiotics Hives        Medication List     TAKE these medications    albuterol 108 (90 Base) MCG/ACT inhaler Commonly known as: VENTOLIN HFA Inhale 1-2 puffs into the lungs every 6 (six) hours as needed for wheezing or shortness of breath.   albuterol (2.5 MG/3ML) 0.083% nebulizer solution Commonly known as: PROVENTIL Take 2.5 mg by nebulization every 6 (six) hours as needed for wheezing or shortness of breath.   amitriptyline 50 MG tablet Commonly known as: ELAVIL Take 50 mg by mouth at bedtime.   atorvastatin 40 MG tablet Commonly known as: LIPITOR TAKE 1 TABLET DAILY   budesonide-formoterol 160-4.5 MCG/ACT inhaler Commonly known as: Symbicort Inhale 2 puffs into the lungs in the morning and at bedtime.   cyclobenzaprine 10 MG tablet Commonly known as: FLEXERIL Take 10 mg by mouth daily as needed for muscle spasms.   folic acid 1 MG tablet Commonly known as: FOLVITE Take 1 tablet (1 mg total) by mouth daily.   Glyxambi 10-5 MG Tabs Generic drug: Empagliflozin-linaGLIPtin Take 1 tablet by mouth at bedtime.   insulin glargine 100 UNIT/ML injection Commonly known as: LANTUS Inject 46 Units into the skin daily after breakfast.   insulin lispro 100 UNIT/ML injection Commonly known as: HUMALOG Inject 3 Units into the skin 3 (three) times daily after meals.   irbesartan 75 MG tablet Commonly known as: AVAPRO Take 1 tablet (75 mg total) by mouth daily. What changed: when to take this   multivitamin with minerals Tabs tablet Take 1 tablet by mouth daily.   omeprazole 20 MG capsule Commonly known as: PRILOSEC Take 20 mg by mouth daily as needed (heartburn).   rivaroxaban 20 MG Tabs tablet Commonly known as: XARELTO Take 1 tablet (20 mg total) by  mouth daily with supper.   thiamine 50 MG tablet Commonly known as: VITAMIN B-1 Take 150 mg by mouth daily.         The results of significant diagnostics from this hospitalization (including imaging, microbiology, ancillary and laboratory) are listed below for reference.    Procedures and Diagnostic Studies:   DG Chest 2 View  Result Date: 05/26/2021 CLINICAL DATA:  COPD EXAM: CHEST - 2 VIEW COMPARISON:  03/19/2020 FINDINGS: Frontal and lateral views of the chest demonstrate an unremarkable cardiac silhouette. Lungs remain hyperinflated, with upper lobe predominant emphysema again noted. No airspace disease, effusion, or pneumothorax. No acute bony abnormalities. IMPRESSION: 1. Emphysema.  No acute process. Electronically Signed   By: Legrand Como  Owens Shark M.D.   On: 05/26/2021 21:10   DG Ankle 2 Views Right  Result Date: 05/26/2021 CLINICAL DATA:  Ankle pain.  No injury. EXAM: RIGHT ANKLE - 2 VIEW COMPARISON:  None. FINDINGS: There is no evidence of fracture, dislocation, or joint effusion. There is no evidence of arthropathy or other focal bone abnormality. Soft tissues are unremarkable. IMPRESSION: Negative. Electronically Signed   By: Rolm Baptise M.D.   On: 05/26/2021 21:08   US ARTERIAL SEG MULTIPLE LE (ABI, SEGMENTAL PRESSURES, PVR'S)  Addendum Date: 05/26/2021   ADDENDUM REPORT: 05/26/2021 14:57 ADDENDUM: These results were called by telephone at the time of interpretation on 05/26/2021 at 2:57 pm to provider Bing Matter , who verbally acknowledged these results. Electronically Signed   By: Markus Daft M.D.   On: 05/26/2021 14:57   Result Date: 05/26/2021 CLINICAL DATA:  71 year old with severe claudication. History of revascularization in the right lower extremity. EXAM: NONINVASIVE PHYSIOLOGIC VASCULAR STUDY OF BILATERAL LOWER EXTREMITIES TECHNIQUE: Non-invasive vascular evaluation of both lower extremities was performed at rest, including calculation of ankle-brachial indices, multiple  segmental pressure evaluation, segmental Doppler and segmental pulse volume recording. COMPARISON:  Report from 12/17/2015 FINDINGS: Right Lower Extremity Resting ABI: 0 Resting TBI: 0 Segmental Pressures: No segmental pressures obtained. Great toe pressure: No signal Arterial Waveforms: Monophasic and biphasic waveforms in the right femoral segment. Monophasic waveforms at the right popliteal artery. No signal identified at the posterior tibial artery or anterior tibial artery. PVRs: Abnormal waveforms in the right calf and ankle. Left Lower Extremity: Resting ABI: 1.02 Resting TBI: 0.74 Segmental Pressures: Normal segmental pressures, no significant (20 mmHg) pressure gradient between adjacent segments. Great toe pressure: 95 Arterial Waveforms: Biphasic and triphasic Doppler waveforms in the left lower extremity. PVRs: Normal PVRs with maintained waveform amplitude, augmentation and quality. Other: Symmetric upper extremity pressures. Ankle Brachial index > 1.4 Non diagnostic secondary to incompressible vessel calcifications 1.0-1.4       Normal 0.9-0.99     Borderline PAD 0.8-0.89     Mild PAD 0.5-0.79     Moderate PAD < 0.5          Severe PAD Toe Brachial Index Normal     >0.65 Moderate  0.53-0.64 Severe     <0.23 Toe Pressures Absolute toe pressure >3mmHg sufficient for wound healing. Toe pressures <61mmHg = critical limb ischemia. IMPRESSION: 1. Absent pulses at the right ankle and could not obtain a right ankle-brachial index. Findings are suggestive for arterial occlusion in the right lower extremity. 2. Left ABI is 1.02. Electronically Signed: By: Markus Daft M.D. On: 05/26/2021 14:48     Labs:   Basic Metabolic Panel: Recent Labs  Lab 05/26/21 1652 05/26/21 2038 05/27/21 0410  NA 139  --  138  K 4.3  --  3.7  CL 101  --  103  CO2 23  --  26  GLUCOSE 158*  --  60*  BUN 15  --  16  CREATININE 1.36*  --  1.23  CALCIUM 9.2  --  9.0  MG  --  1.9 2.1  PHOS  --  3.7 4.5   GFR Estimated  Creatinine Clearance: 52.5 mL/min (by C-G formula based on SCr of 1.23 mg/dL). Liver Function Tests: Recent Labs  Lab 05/26/21 2038 05/27/21 0410  AST 26 26  ALT 26 25  ALKPHOS 69 66  BILITOT 0.6 0.6  PROT 6.7 6.5  ALBUMIN 3.6 3.5   No results for input(s): LIPASE, AMYLASE in the  last 168 hours. No results for input(s): AMMONIA in the last 168 hours. Coagulation profile No results for input(s): INR, PROTIME in the last 168 hours.  CBC: Recent Labs  Lab 05/26/21 1652 05/27/21 0410  WBC 9.4 10.1  NEUTROABS 7.2 7.3  HGB 17.1* 15.7  HCT 49.6 46.7  MCV 98.4 100.6*  PLT 182 181   Cardiac Enzymes: Recent Labs  Lab 05/26/21 2038  CKTOTAL 99   BNP: Invalid input(s): POCBNP CBG: Recent Labs  Lab 05/26/21 2344 05/27/21 0444 05/27/21 0624 05/27/21 0810 05/27/21 1346  GLUCAP 94 66* 83 90 63*   D-Dimer No results for input(s): DDIMER in the last 72 hours. Hgb A1c Recent Labs    05/26/21 2038  HGBA1C 7.7*   Lipid Profile No results for input(s): CHOL, HDL, LDLCALC, TRIG, CHOLHDL, LDLDIRECT in the last 72 hours. Thyroid function studies Recent Labs    05/26/21 2038  TSH 2.885   Anemia work up No results for input(s): VITAMINB12, FOLATE, FERRITIN, TIBC, IRON, RETICCTPCT in the last 72 hours. Microbiology Recent Results (from the past 240 hour(s))  Resp Panel by RT-PCR (Flu A&B, Covid) Nasopharyngeal Swab     Status: None   Collection Time: 05/26/21  4:52 PM   Specimen: Nasopharyngeal Swab; Nasopharyngeal(NP) swabs in vial transport medium  Result Value Ref Range Status   SARS Coronavirus 2 by RT PCR NEGATIVE NEGATIVE Final    Comment: (NOTE) SARS-CoV-2 target nucleic acids are NOT DETECTED.  The SARS-CoV-2 RNA is generally detectable in upper respiratory specimens during the acute phase of infection. The lowest concentration of SARS-CoV-2 viral copies this assay can detect is 138 copies/mL. A negative result does not preclude SARS-Cov-2 infection and  should not be used as the sole basis for treatment or other patient management decisions. A negative result may occur with  improper specimen collection/handling, submission of specimen other than nasopharyngeal swab, presence of viral mutation(s) within the areas targeted by this assay, and inadequate number of viral copies(<138 copies/mL). A negative result must be combined with clinical observations, patient history, and epidemiological information. The expected result is Negative.  Fact Sheet for Patients:  EntrepreneurPulse.com.au  Fact Sheet for Healthcare Providers:  IncredibleEmployment.be  This test is no t yet approved or cleared by the [place] FDA and  has been authorized for detection and/or diagnosis of SARS-CoV-2 by FDA under an Emergency Use Authorization (EUA). This EUA will remain  in effect (meaning this test can be used) for the duration of the COVID-19 declaration under Section 564(b)(1) of the Act, 21 U.S.C.section 360bbb-3(b)(1), unless the authorization is terminated  or revoked sooner.       Influenza A by PCR NEGATIVE NEGATIVE Final   Influenza B by PCR NEGATIVE NEGATIVE Final    Comment: (NOTE) The Xpert Xpress SARS-CoV-2/FLU/RSV plus assay is intended as an aid in the diagnosis of influenza from Nasopharyngeal swab specimens and should not be used as a sole basis for treatment. Nasal washings and aspirates are unacceptable for Xpert Xpress SARS-CoV-2/FLU/RSV testing.  Fact Sheet for Patients: EntrepreneurPulse.com.au  Fact Sheet for Healthcare Providers: IncredibleEmployment.be  This test is not yet approved or cleared by the [place] FDA and has been authorized for detection and/or diagnosis of SARS-CoV-2 by FDA under an Emergency Use Authorization (EUA). This EUA will remain in effect (meaning this test can be used) for the duration of the COVID-19 declaration  under Section 564(b)(1) of the Act, 21 U.S.C. section 360bbb-3(b)(1), unless the authorization is terminated or revoked.  Performed  at [facility], [place] [address], [place], [place] [address]     Time coordinating discharge: 35 minutes  Signed: Eduar Kumpf  [facility] 05/27/2021, 1:57 PM

## 2021-05-27 NOTE — Progress Notes (Signed)
[facility] ?PROGRESS NOTE ? ?ASSESSMENT / PLAN: ?Terry Munoz is a 71 y.o. male with subacute ischemia with new onset short distance claudication in the right lower extremity; history of thrombectomy. Plan angiogram today.  ? ?SUBJECTIVE: ?No questions. ? ?OBJECTIVE: ?BP 136/79   Pulse 84   Temp 97.8 ?F (36.6 ?C) (Oral)   Resp 17   Ht 5\' 8"  (1.727 m)   Wt 73.5 kg   SpO2 95%   BMI 24.63 kg/m?  ? ?Intake/Output Summary (Last 24 hours) at 05/27/2021 1222 ?Last data filed at 05/27/2021 [phone] ?Gross per 24 hour  ?Intake 103.21 ml  ?Output 700 ml  ?Net -596.79 ml  ?  ?Constitutional: chronically ill appearing. no acute distress. ?Cardiac: RRR. ?Pulmonary: unlabored ?Abdomen: soft ?Vascular: 2+ femoral pulses; no pedal pulses ? ?CBC Latest Ref Rng & Units 05/27/2021 05/26/2021 03/21/2020  ?WBC 4.0 - 10.5 K/uL 10.1 9.4 7.0  ?Hemoglobin 13.0 - 17.0 g/dL 15.7 17.1(H) 14.1  ?Hematocrit 39.0 - 52.0 % 46.7 49.6 40.5  ?Platelets 150 - 400 K/uL 181 182 139(L)  ?  ? ?CMP Latest Ref Rng & Units 05/27/2021 05/26/2021 03/21/2020  ?Glucose 70 - 99 mg/dL 60(L) 158(H) 76  ?BUN 8 - 23 mg/dL 16 15 14   ?Creatinine 0.61 - 1.24 mg/dL 1.23 1.36(H) 1.07  ?Sodium 135 - 145 mmol/L 138 139 132(L)  ?Potassium 3.5 - 5.1 mmol/L 3.7 4.3 3.4(L)  ?Chloride 98 - 111 mmol/L 103 101 96(L)  ?CO2 22 - 32 mmol/L 26 23 24   ?Calcium 8.9 - 10.3 mg/dL 9.0 9.2 8.6(L)  ?Total Protein 6.5 - 8.1 g/dL 6.5 6.7 6.6  ?Total Bilirubin 0.3 - 1.2 mg/dL 0.6 0.6 0.8  ?Alkaline Phos 38 - 126 U/L 66 69 54  ?AST 15 - 41 U/L 26 26 43(H)  ?ALT 0 - 44 U/L 25 26 31   ? ? ?Estimated Creatinine Clearance: 52.5 mL/min (by C-G formula based on SCr of 1.23 mg/dL). ? ?Yevonne Aline. Stanford Breed, MD ?[facility] [place] ?Office Phone Number: [phone] ?05/27/2021 12:22 PM ? ? ? ?

## 2021-05-27 NOTE — ED Notes (Signed)
CBG 66, Chen MD paged  ?

## 2021-05-27 NOTE — Progress Notes (Signed)
ANTICOAGULATION CONSULT NOTE  ? ?Pharmacy Consult for heparin ?Indication: DVT ? ?Allergies  ?Allergen Reactions  ? Sulfa Antibiotics Hives  ? ? ?Patient Measurements: ?Height: 5\' 8"  (172.7 cm) ?Weight: 73.5 kg (162 lb) ?IBW/kg (Calculated) : 68.4 ?Heparin Dosing Weight: TBW ? ?Vital Signs: ?Temp: 97.8 ?F (36.6 ?C) (03/02 1633) ?Temp Source: Oral (03/02 1633) ?BP: 107/63 (03/03 0200) ?Pulse Rate: 86 (03/03 0200) ? ?Labs: ?Recent Labs  ?  05/26/21 ?1652 05/26/21 ?2038 05/27/21 ?0231  ?HGB 17.1*  --   --   ?HCT 49.6  --   --   ?PLT 182  --   --   ?APTT  --   --  123*  ?HEPARINUNFRC  --   --  0.98*  ?CREATININE 1.36*  --   --   ?CKTOTAL  --  99  --   ? ? ?Estimated Creatinine Clearance: 47.5 mL/min (A) (by C-G formula based on SCr of 1.36 mg/dL (H)). ? ? ?Medical History: ?Past Medical History:  ?Diagnosis Date  ? COPD (chronic obstructive pulmonary disease) ([place])   ? Diabetes mellitus without complication ([place])   ? INSULIN DEPENDENT  ? Hyperlipidemia 10/04/2019  ? Hypertension   ? Hyponatremia 03/2014  ? Insomnia   ? Kidney stones   ? Mouth ulcers   ? Osteoarthritis   ? Otitis media   ? Peripheral vascular disease ([place])   ? Sinusitis   ? ? ?Assessment: ?29 YOM presenting with R-leg pain, ultrasound in clinic with possible arterial occlusion, he is on Xarelto PTA for hx of PE, will start without bolus considering new claudication/limb ischemia ?Vascular planning angiogram 3/3. ? ?Initial heparin level and aptt above goal. No bleeding issues noted. Of note heparin level and aptt appear to be correlating, will continue checking both another day to confirm.  ? ?Goal of Therapy:  ?Heparin level 0.3-0.7 units/ml ?aPTT 66-102 seconds ?Monitor platelets by anticoagulation protocol: Yes ?  ?Plan:  ?Hold heparin x 1 hour, restart heparin at 950/hr.  ?Recheck aptt later today ?F/u VVS plans ? ?Erin Hearing PharmD., BCPS ?Clinical Pharmacist ?05/27/2021 3:26 AM ? ?

## 2021-05-30 ENCOUNTER — Encounter (HOSPITAL_COMMUNITY): Payer: Self-pay | Admitting: Vascular Surgery

## 2021-05-30 ENCOUNTER — Other Ambulatory Visit: Payer: Self-pay

## 2021-05-30 DIAGNOSIS — I739 Peripheral vascular disease, unspecified: Secondary | ICD-10-CM

## 2021-05-30 DIAGNOSIS — R079 Chest pain, unspecified: Secondary | ICD-10-CM | POA: Insufficient documentation

## 2021-05-30 DIAGNOSIS — Z95820 Peripheral vascular angioplasty status with implants and grafts: Secondary | ICD-10-CM | POA: Insufficient documentation

## 2021-05-30 DIAGNOSIS — Z860100 Personal history of colon polyps, unspecified: Secondary | ICD-10-CM | POA: Insufficient documentation

## 2021-05-30 DIAGNOSIS — R195 Other fecal abnormalities: Secondary | ICD-10-CM | POA: Insufficient documentation

## 2021-05-30 DIAGNOSIS — K219 Gastro-esophageal reflux disease without esophagitis: Secondary | ICD-10-CM | POA: Insufficient documentation

## 2021-05-30 DIAGNOSIS — R1111 Vomiting without nausea: Secondary | ICD-10-CM | POA: Insufficient documentation

## 2021-05-30 DIAGNOSIS — R1013 Epigastric pain: Secondary | ICD-10-CM | POA: Insufficient documentation

## 2021-06-03 ENCOUNTER — Ambulatory Visit: Payer: Medicare Other | Admitting: Cardiology

## 2021-06-03 ENCOUNTER — Encounter: Payer: Self-pay | Admitting: Cardiology

## 2021-06-03 ENCOUNTER — Other Ambulatory Visit: Payer: Self-pay

## 2021-06-03 VITALS — BP 130/82 | HR 105 | Temp 98.2°F | Resp 16 | Ht 68.0 in | Wt 166.8 lb

## 2021-06-03 DIAGNOSIS — I739 Peripheral vascular disease, unspecified: Secondary | ICD-10-CM

## 2021-06-03 DIAGNOSIS — Z86711 Personal history of pulmonary embolism: Secondary | ICD-10-CM

## 2021-06-03 DIAGNOSIS — F17209 Nicotine dependence, unspecified, with unspecified nicotine-induced disorders: Secondary | ICD-10-CM

## 2021-06-03 DIAGNOSIS — J431 Panlobular emphysema: Secondary | ICD-10-CM

## 2021-06-03 DIAGNOSIS — I10 Essential (primary) hypertension: Secondary | ICD-10-CM

## 2021-06-03 MED ORDER — RIVAROXABAN 2.5 MG PO TABS
2.5000 mg | ORAL_TABLET | Freq: Two times a day (BID) | ORAL | 1 refills | Status: DC
Start: 2021-06-03 — End: 2021-11-14

## 2021-06-03 MED ORDER — ASPIRIN EC 81 MG PO TBEC: 81.0000 mg | DELAYED_RELEASE_TABLET | Freq: Every day | ORAL | 3 refills | Status: AC

## 2021-06-03 NOTE — Progress Notes (Signed)
Primary Physician/Referring:  Aletha Halim., PA-C  Patient ID: Terry Munoz, male    DOB: Aug 01, 1950, 71 y.o.   MRN: [id number]  CC: Claudication and hypertension  HPI:    Terry Munoz  is a 71 y.o. Caucasian male with greater than 100-pack-year history of smoking, hyperlipidemia, hypertension, diabetes mellitus, peripheral arterial disease history of Viabahn stent implantation to his right SFA on 05/01/2014 however presented on 07/24/2015 with acute limb ischemia needing emergent surgery with thrombectomy and patch angioplasty of the right popliteal artery.  He also has history of PE on 02/25/2020 and has been on Xarelto since.  He again presented to the hospital emergency room with right leg pain on 05/26/2021, underwent peripheral arteriogram by Dr. Jamelle Haring and found to have patent single-vessel runoff at the level of his ankle and occluded right SFA that is chronic.  Recommended medical therapy.  He now presents for 1 year follow-up with me.  He has reduced drinking alcohol to 6 cans of beer a day now and he also has reduced smoking cigarettes.  Accompanied by his wife.  Except for chronic dyspnea and cough, no specific complaints otherwise.     Past Medical History:  Diagnosis Date   COPD (chronic obstructive pulmonary disease) ([place])    Diabetes mellitus without complication ([place])    INSULIN DEPENDENT   Hyperlipidemia 10/04/2019   Hypertension    Hyponatremia 03/2014   Insomnia    Kidney stones    Mouth ulcers    Osteoarthritis    Otitis media    Peripheral vascular disease ([facility])    Sinusitis    Social History   Tobacco Use   Smoking status: Every Day    Packs/day: 2.00    Years: 45.00    Pack years: 90.00    Types: Cigarettes   Smokeless tobacco: Never  Substance Use Topics   Alcohol use: Yes    Alcohol/week: 24.0 standard drinks    Types: 24 Cans of beer per week    Comment: daily  Married    ROS  Review of Systems  Cardiovascular:  Positive for  claudication (minimal). Negative for chest pain and leg swelling.  Respiratory:  Positive for cough, shortness of breath and wheezing.   Gastrointestinal:  Negative for melena.  Objective  Blood pressure 130/82, pulse (!) 105, temperature 98.2 F (36.8 C), temperature source Temporal, resp. rate 16, height '5\' 8"'$  (1.727 m), weight 166 lb 12.8 oz (75.7 kg), SpO2 94 %. Body mass index is 25.36 kg/m.   Vitals with BMI 06/03/2021 05/27/2021 05/27/2021  Height '5\' 8"'$  - -  Weight 166 lbs 13 oz - -  BMI 79.39 - -  Systolic 030 092 330  Diastolic 82 82 70  Pulse 076 94 88     Physical Exam Constitutional:      Appearance: He is well-developed.  Neck:     Vascular: No JVD.  Cardiovascular:     Rate and Rhythm: Normal rate and regular rhythm.     Pulses:          Carotid pulses are 2+ on the right side and 2+ on the left side.      Femoral pulses are 2+ on the right side and 2+ on the left side.      Popliteal pulses are 0 on the right side and 1+ on the left side.       Dorsalis pedis pulses are 0 on the right side and 0 on the left  side.       Posterior tibial pulses are 0 on the right side and 0 on the left side.     Heart sounds: Normal heart sounds. No murmur heard.   No gallop.  Pulmonary:     Effort: Pulmonary effort is normal.     Breath sounds: Rales (Bilateral bronchovesicular breath sounds with occasinal rales bilateral) present.  Abdominal:     General: Bowel sounds are normal.     Palpations: Abdomen is soft.     Comments: Reducible umbilical hernia present.  Musculoskeletal:     Right lower leg: No edema.     Left lower leg: No edema.   Radiology: No results found.  Laboratory examination:   Recent Labs    05/26/21 1652 05/27/21 0410  NA 139 138  K 4.3 3.7  CL 101 103  CO2 23 26  GLUCOSE 158* 60*  BUN 15 16  CREATININE 1.36* 1.23  CALCIUM 9.2 9.0  GFRNONAA 55* >60   CMP Latest Ref Rng & Units 05/27/2021 05/26/2021 03/21/2020  Glucose 70 - 99 mg/dL 60(L) 158(H)  76  BUN 8 - 23 mg/dL '16 15 14  '$ Creatinine 0.61 - 1.24 mg/dL 1.23 1.36(H) 1.07  Sodium 135 - 145 mmol/L 138 139 132(L)  Potassium 3.5 - 5.1 mmol/L 3.7 4.3 3.4(L)  Chloride 98 - 111 mmol/L 103 101 96(L)  CO2 22 - 32 mmol/L '26 23 24  '$ Calcium 8.9 - 10.3 mg/dL 9.0 9.2 8.6(L)  Total Protein 6.5 - 8.1 g/dL 6.5 6.7 6.6  Total Bilirubin 0.3 - 1.2 mg/dL 0.6 0.6 0.8  Alkaline Phos 38 - 126 U/L 66 69 54  AST 15 - 41 U/L 26 26 43(H)  ALT 0 - 44 U/L '25 26 31   '$ CBC Latest Ref Rng & Units 05/27/2021 05/26/2021 03/21/2020  WBC 4.0 - 10.5 K/uL 10.1 9.4 7.0  Hemoglobin 13.0 - 17.0 g/dL 15.7 17.1(H) 14.1  Hematocrit 39.0 - 52.0 % 46.7 49.6 40.5  Platelets 150 - 400 K/uL 181 182 139(L)   Lipid Panel     Component Value Date/Time   CHOL 176 11/21/2018 0831   TRIG 60 11/21/2018 0831   HDL 102 11/21/2018 0831   CHOLHDL 2.2 04/20/2014 0414   VLDL 11 04/20/2014 0414   LDLCALC 62 11/21/2018 0831   HEMOGLOBIN A1C Lab Results  Component Value Date   HGBA1C 7.7 (H) 05/26/2021   MPG 174.29 05/26/2021   TSH Recent Labs    05/26/21 2038  TSH 2.885   External Labs:    Medications    Current Outpatient Medications:    albuterol (PROVENTIL HFA;VENTOLIN HFA) 108 (90 Base) MCG/ACT inhaler, Inhale 1-2 puffs into the lungs every 6 (six) hours as needed for wheezing or shortness of breath., Disp: , Rfl:    albuterol (PROVENTIL) (2.5 MG/3ML) 0.083% nebulizer solution, Take 2.5 mg by nebulization every 6 (six) hours as needed for wheezing or shortness of breath. , Disp: , Rfl:    amitriptyline (ELAVIL) 50 MG tablet, Take 50 mg by mouth at bedtime., Disp: , Rfl:    aspirin EC 81 MG tablet, Take 1 tablet (81 mg total) by mouth daily., Disp: 90 tablet, Rfl: 3   atorvastatin (LIPITOR) 40 MG tablet, TAKE 1 TABLET DAILY (Patient taking differently: Take 40 mg by mouth daily.), Disp: 90 tablet, Rfl: 3   budesonide-formoterol (SYMBICORT) 160-4.5 MCG/ACT inhaler, Inhale 2 puffs into the lungs in the morning and at  bedtime., Disp: 3 each, Rfl: 12   cyclobenzaprine (  FLEXERIL) 10 MG tablet, Take 10 mg by mouth daily as needed for muscle spasms., Disp: , Rfl:    folic acid (FOLVITE) 1 MG tablet, Take 1 tablet (1 mg total) by mouth daily., Disp: 30 tablet, Rfl: 0   GLYXAMBI 10-5 MG TABS, Take 1 tablet by mouth at bedtime., Disp: , Rfl:    insulin glargine (LANTUS) 100 UNIT/ML injection, Inject 46 Units into the skin daily after breakfast. , Disp: , Rfl:    insulin lispro (HUMALOG) 100 UNIT/ML injection, Inject 3 Units into the skin 3 (three) times daily after meals., Disp: , Rfl:    irbesartan (AVAPRO) 150 MG tablet, Take 150 mg by mouth daily., Disp: , Rfl:    Multiple Vitamin (MULTIVITAMIN WITH MINERALS) TABS tablet, Take 1 tablet by mouth daily., Disp: , Rfl:    omeprazole (PRILOSEC) 20 MG capsule, Take 20 mg by mouth daily as needed (heartburn)., Disp: , Rfl:    rivaroxaban (XARELTO) 2.5 MG TABS tablet, Take 1 tablet (2.5 mg total) by mouth 2 (two) times daily., Disp: 180 tablet, Rfl: 1   thiamine (VITAMIN B-1) 50 MG tablet, Take 150 mg by mouth daily., Disp: , Rfl:      Cardiac Studies:   Echocardiogram 04/14/2014:  Left ventricle cavity is normal in size. Normal global wall motion. Normal diastolic filling pattern. Visual EF is 55-60%. Calculated EF 51%. Trace mitral regurgitation. Trace tricuspid regurgitation. Trace pulmonic regurgitation. Essentially normal echocardiogram.  Lexiscan myoview stress test 04/13/2014: 1. The resting electrocardiogram demonstrated normal sinus rhythm, normal resting conduction, poor R progression, cannot r/o anterior infarct, old. No resting arrhythmias and normal rest repolarization.  Stress EKG is nondiagnostic for ischemia as it is a Pharmacologic stress test using Lexiscan infusion. Stress symptoms included dyspnea, dizziness. 2. Myocardial perfusion imaging is normal. Overall left ventricular systolic function was normal without regional wall motion abnormalities. The left  ventricular ejection fraction was 74%.  PV angio 05/01/2014: stenting of the right proximal to mid SFA with implantation of 2 overlapping 6.0 x 150 and a 6 x 100 mm Viabahn covered stents. One vessel R/O (PT) right leg. Mild disease left leg.  Lower extremity arterial duplex 06/14/2016: No hemodynamically significant stenoses are identified in the lower extremity arterial system. This exam reveals mildly decreased perfusion of the  lower extremity, LABI 0.87 and RABI 0.90 withnormal triphasic wavefroms noted at the post tibial artery level.  Compared to the study done on 06/15/2015, no significant change, ABI mildly reduced from 0.95, may be due to technical error.  Right SFA stent appears patent.  EKG:  EKG 06/04/2019: Sinus tachycardia at the rate of 100 bpm, left axis deviation, left anterior fascicular block.  Poor R wave progression, cannot exclude anteroseptal infarct old.  Low-voltage complexes.  Single PVC.  No evidence of ischemia.   -possible pulmonary disease.  No significant change from  EKG 11/08/2017.  Assessment     ICD-10-CM   1. Essential hypertension  I10 EKG 12-Lead    2. Claudication ([place])  I73.9     3. PAD (peripheral artery disease) (HCC)  I73.9 rivaroxaban (XARELTO) 2.5 MG TABS tablet    aspirin EC 81 MG tablet    4. Tobacco use disorder, continuous  F17.209     5. Panlobular emphysema ([place])  J43.1     6. History of pulmonary embolism 03/16/2020  Z86.711       Meds ordered this encounter  Medications   rivaroxaban (XARELTO) 2.5 MG TABS tablet    Sig: Take  1 tablet (2.5 mg total) by mouth 2 (two) times daily.    Dispense:  180 tablet    Refill:  1    Discontinue Xarelto 20 mg   aspirin EC 81 MG tablet    Sig: Take 1 tablet (81 mg total) by mouth daily.    Dispense:  90 tablet    Refill:  3   Medications Discontinued During This Encounter  Medication Reason   irbesartan (AVAPRO) 75 MG tablet Dose change   rivaroxaban (XARELTO) 20 MG TABS tablet Dose  change   Orders Placed This Encounter  Procedures   EKG 12-Lead       Recommendations:   Terry Munoz  is a 71 y.o. Caucasian male with greater than 100-pack-year history of smoking, hyperlipidemia, hypertension, diabetes mellitus, peripheral arterial disease history of Viabahn stent implantation to his right SFA on 05/01/2014 however presented on 07/24/2015 with acute limb ischemia needing emergent surgery with thrombectomy and patch angioplasty of the right popliteal artery.  He also has history of PE on 02/25/2020 and has been on Xarelto since.  He again presented to the hospital emergency room with right leg pain on 05/26/2021, underwent peripheral arteriogram by Dr. Jamelle Haring and found to have patent single-vessel runoff at the level of his ankle and occluded right SFA that is chronic.  Recommended medical therapy.  He now presents for 1 year follow-up with me.  Patient's symptoms of claudication remained stable.  His vascular examination reveals slow capillary refill in his right lower extremity but intact, no evidence of critical limb ischemia and agree with medical therapy for now.  He has history of subsegmental pulmonary embolism in 2021 and has been on chronic Xarelto 20 mg daily.  I will discontinue this, reduce the dose to 2.5 mg p.o. twice daily and also add aspirin 81 mg daily.  No indication for long-term anticoagulation full dose.  I extensively discussed with the patient regarding smoking cessation and the risk of limb loss.  I also discussed with him that he has severe emphysema and in the event of a cardiac arrest or respiratory arrest, if he is ever intubated he may not have any capacity for extubation.  His wife is present at the bedside and acknowledged this.  Smoking cessation counseling for additional 6 minutes performed.  I reviewed his hospitalization records, lipids are controlled, blood pressure is controlled, diabetes continues to be uncontrolled.   I will continue  see him back in 6 months. Adrian Prows, MD, [facility] 06/04/2021, 11:29 AM Office: [phone] Pager: [phone]

## 2021-06-13 NOTE — Progress Notes (Signed)
[facility] [place] ?  ?ASSESSMENT / PLAN: ?Terry Munoz is a 71 y.o. 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 ? ?Patient counseled patients with asymptomatic peripheral arterial disease or claudication have a 1-2% risk of developing chronic limb threatening ischemia, but a 15-30% risk of mortality in the next 5 years. Intervention should only be considered for medically optimized patients with disabling symptoms.  ? ?Recommend the following which can slow the progression of atherosclerosis and reduce the risk of major adverse cardiac / limb events:  ?Complete cessation from all tobacco products. ?Blood glucose control with goal A1c < 7%. ?Blood pressure control with goal blood pressure < 140/90 mmHg. ?Lipid reduction therapy with goal LDL-C <100 mg/dL (<70 if symptomatic from PAD).  ?Aspirin '81mg'$  PO QD.  ?Atorvastatin 40-'80mg'$  PO QD (or other "high intensity" statin therapy). ?Daily walking to and past the point of discomfort. Patient counseled to keep a log of exercise distance. ? ?Follow-up with me in 3 to 6 months with repeat ABI for surveillance of symptoms.  The patient about symptoms of chronic limb threatening ischemia and encouraged him to return to care urgently should any of these develop. ?  ?CHIEF COMPLAINT: abnormal non-invasive vascular study ?  ?HISTORY OF PRESENT ILLNESS: ?Terry Munoz is a 71 y.o. male referred to ER for evaluation of abnormal noninvasive vascular studies.  The patient has a strong personal history of peripheral vascular disease.  He had superficial femoral artery stents placed in early 2017.  These acutely thrombosed causing acute limb ischemia.  Dr. Bridgett Larsson took him for thrombectomy of the right lower extremity from a popliteal approach, and performed endarterectomy of the popliteal and tibial vessels.  A saphenous vein patch was used.  The patient reports no preceding symptoms of chronic  ischemia to me. He did report some mild claudication symptoms to Dr. Einar Gip about a year ago. He reports short distance claudication began several days ago.  He has been compliant with Xarelto.  ?  ?06/14/21: Patient returns to clinic to discuss angiogram findings.  His symptoms are thankfully quite stable.  He reports he is able to walk around the house and about his property to the barn and back.  He continues to smoke cigarettes.  I counseled him again on the importance of quitting.  I counseled him on the importance of exercise therapy. ? ?VASCULAR SURGICAL HISTORY:  ?Right SFA stenting (2016 by cardiology)  ?Right below knee popliteal and tibial thrombectomy and calf fasciotomy (Dr. Bridgett Larsson 07/24/15)  ?Patient lost to follow up after 12/17/15 ?  ?VASCULAR RISK FACTORS: ?Negative history of stroke / transient ischemic attack. ?Negative history of coronary artery disease.  ?Positive history of diabetes mellitus. Last A1c 7.7. ?Positive history of smoking. + actively smoking. ?Positive history of hypertension.  ?Negative history of chronic kidney disease.  Last GFR >60.  ?Positive history of chronic obstructive pulmonary disease. ?  ?FUNCTIONAL STATUS: ?ECOG performance status: (1) Restricted in physically strenuous activity, ambulatory and able to do work of light nature ?Ambulatory status: Ambulatory within the community with limits ?  ?Past Medical History:  ?Diagnosis Date  ? COPD (chronic obstructive pulmonary disease) ([place])   ? Diabetes mellitus without complication ([place])   ? INSULIN DEPENDENT  ? Hyperlipidemia 10/04/2019  ? Hypertension   ? Hyponatremia 03/2014  ? Insomnia   ? Kidney stones   ? Mouth ulcers   ? Osteoarthritis   ? Otitis media   ? Peripheral  vascular disease ([place])   ? Sinusitis   ? ? ?  ?Past Surgical History:  ?Procedure Laterality Date  ? DENTAL SURGERY    ? FEMORAL-POPLITEAL BYPASS GRAFT Right 07/24/2015  ? Procedure: Thrombectomy Right Femoral-Popliteal Artery; Thrombectomy Right Anterior-Tibial  Artery, Thrombectomy Right Tibial-Peroneal Trunk; Bovine Patch Angioplasty Right Popliteal Artery; Endarterectomy Right Anterior Tibial Artery and Right Tibial-Peroneal Trunk; Right Leg Angiogram, Four Compartment Fasciotomy Right Lower Leg;  Surgeon: Conrad Henrietta, MD;  Location: [facility];  Service: Vascular;  L  ? HERNIA REPAIR    ? lithotomy    ? LOWER EXTREMITY ANGIOGRAM N/A 05/01/2014  ? Procedure: LOWER EXTREMITY ANGIOGRAM;  Surgeon: Laverda Page, MD;  Location: [facility] CATH LAB;  Service: Cardiovascular;  Laterality: N/A;  ? LOWER EXTREMITY ANGIOGRAPHY N/A 05/27/2021  ? Procedure: Lower Extremity Angiography;  Surgeon: Cherre Robins, MD;  Location: [facility] CV LAB;  Service: Cardiovascular;  Laterality: N/A;  ? NECK SURGERY    ? POPLITEAL ARTERY ANGIOPLASTY Right 05/02/2014  ? to mid sfa  ? THROMBECTOMY  05/02/2014  ? ? ?  ?Family History  ?Problem Relation Age of Onset  ? Heart disease Mother   ? Hypertension Mother   ? Hypertension Father   ? Hypertension Sister   ? Hypertension Brother   ? Diabetes Brother   ? Hypertension Sister   ? ?Social History  ? ?Socioeconomic History  ? Marital status: Married  ?  Spouse name: Not on file  ? Number of children: 1  ? Years of education: Not on file  ? Highest education level: Not on file  ?Occupational History  ? Not on file  ?Tobacco Use  ? Smoking status: Every Day  ?  Packs/day: 2.00  ?  Years: 45.00  ?  Pack years: 90.00  ?  Types: Cigarettes  ? Smokeless tobacco: Never  ?Vaping Use  ? Vaping Use: Some days  ?Substance and Sexual Activity  ? Alcohol use: Yes  ?  Alcohol/week: 24.0 standard drinks  ?  Types: 24 Cans of beer per week  ?  Comment: daily  ? Drug use: No  ? Sexual activity: Not on file  ?Other Topics Concern  ? Not on file  ?Social History Narrative  ? Not on file  ? ?Social Determinants of Health  ? ?Financial Resource Strain: Not on file  ?Food Insecurity: Not on file  ?Transportation Needs: Not on file  ?Physical Activity: Not on file  ?Stress: Not  on file  ?Social Connections: Not on file  ? ?  ?Allergies  ?Allergen Reactions  ? Sulfa Antibiotics Hives  ? ?Current Outpatient Medications on File Prior to Visit  ?Medication Sig Dispense Refill  ? albuterol (PROVENTIL HFA;VENTOLIN HFA) 108 (90 Base) MCG/ACT inhaler Inhale 1-2 puffs into the lungs every 6 (six) hours as needed for wheezing or shortness of breath.    ? albuterol (PROVENTIL) (2.5 MG/3ML) 0.083% nebulizer solution Take 2.5 mg by nebulization every 6 (six) hours as needed for wheezing or shortness of breath.     ? amitriptyline (ELAVIL) 50 MG tablet Take 50 mg by mouth at bedtime.    ? aspirin EC 81 MG tablet Take 1 tablet (81 mg total) by mouth daily. 90 tablet 3  ? atorvastatin (LIPITOR) 40 MG tablet TAKE 1 TABLET DAILY (Patient taking differently: Take 40 mg by mouth daily.) 90 tablet 3  ? budesonide-formoterol (SYMBICORT) 160-4.5 MCG/ACT inhaler Inhale 2 puffs into the lungs in the morning and at bedtime. 3 each  12  ? cyclobenzaprine (FLEXERIL) 10 MG tablet Take 10 mg by mouth daily as needed for muscle spasms.    ? folic acid (FOLVITE) 1 MG tablet Take 1 tablet (1 mg total) by mouth daily. 30 tablet 0  ? GLYXAMBI 10-5 MG TABS Take 1 tablet by mouth at bedtime.    ? insulin glargine (LANTUS) 100 UNIT/ML injection Inject 46 Units into the skin daily after breakfast.     ? insulin lispro (HUMALOG) 100 UNIT/ML injection Inject 3 Units into the skin 3 (three) times daily after meals.    ? irbesartan (AVAPRO) 150 MG tablet Take 150 mg by mouth daily.    ? Multiple Vitamin (MULTIVITAMIN WITH MINERALS) TABS tablet Take 1 tablet by mouth daily.    ? omeprazole (PRILOSEC) 20 MG capsule Take 20 mg by mouth daily as needed (heartburn).    ? rivaroxaban (XARELTO) 2.5 MG TABS tablet Take 1 tablet (2.5 mg total) by mouth 2 (two) times daily. 180 tablet 1  ? thiamine (VITAMIN B-1) 50 MG tablet Take 150 mg by mouth daily.    ? ?No current facility-administered medications on file prior to visit.  ? ? ?   ?PHYSICAL EXAM ? BP 120/75 (BP Location: Left Arm, Patient Position: Sitting, Cuff Size: Normal)   Pulse 90   Temp 98.8 ?F (37.1 ?C)   Resp 20   Ht '5\' 8"'$  (1.727 m)   Wt 162 lb (73.5 kg)   SpO2 95%   BMI 24.63

## 2021-06-14 ENCOUNTER — Encounter: Payer: Self-pay | Admitting: Vascular Surgery

## 2021-06-14 ENCOUNTER — Ambulatory Visit (INDEPENDENT_AMBULATORY_CARE_PROVIDER_SITE_OTHER): Payer: Medicare Other | Admitting: Vascular Surgery

## 2021-06-14 ENCOUNTER — Other Ambulatory Visit: Payer: Self-pay

## 2021-06-14 ENCOUNTER — Ambulatory Visit (HOSPITAL_COMMUNITY)
Admission: RE | Admit: 2021-06-14 | Discharge: 2021-06-14 | Disposition: A | Discharge status: Home / Self Care | Payer: Medicare Other | Source: Ambulatory Visit | Attending: Vascular Surgery | Admitting: Vascular Surgery

## 2021-06-14 VITALS — BP 120/75 | HR 90 | Temp 98.8°F | Resp 20 | Ht 68.0 in | Wt 162.0 lb

## 2021-06-14 DIAGNOSIS — I739 Peripheral vascular disease, unspecified: Secondary | ICD-10-CM | POA: Diagnosis present

## 2021-06-14 DIAGNOSIS — I70211 Atherosclerosis of native arteries of extremities with intermittent claudication, right leg: Secondary | ICD-10-CM

## 2021-06-24 ENCOUNTER — Other Ambulatory Visit: Payer: Self-pay | Admitting: *Deleted

## 2021-06-24 DIAGNOSIS — I70211 Atherosclerosis of native arteries of extremities with intermittent claudication, right leg: Secondary | ICD-10-CM

## 2021-06-24 DIAGNOSIS — I739 Peripheral vascular disease, unspecified: Secondary | ICD-10-CM

## 2021-07-07 ENCOUNTER — Other Ambulatory Visit: Payer: Self-pay | Admitting: Cardiology

## 2021-07-21 ENCOUNTER — Telehealth: Payer: Self-pay | Admitting: *Deleted

## 2021-07-21 NOTE — Telephone Encounter (Signed)
Patient called complaining of increasing pain in his RLE. Patient states it is keeping him awake at night. He denies any increased swelling or numbness. Schedule patient to be seen 07/28/21 by Dr Stanford Breed with ABI. Instructed patient to report to ER if his foot or leg becomes cold to touch. Patient verblaized understanding. ?

## 2021-07-26 ENCOUNTER — Other Ambulatory Visit: Payer: Self-pay

## 2021-07-26 DIAGNOSIS — I739 Peripheral vascular disease, unspecified: Secondary | ICD-10-CM

## 2021-07-26 NOTE — Progress Notes (Signed)
Error

## 2021-07-27 NOTE — Progress Notes (Signed)
[facility] [place] ?  ?ASSESSMENT / PLAN: ?Terry Munoz is a 71 y.o. 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 ? ?Patient counseled patients with asymptomatic peripheral arterial disease or claudication have a 1-2% risk of developing chronic limb threatening ischemia, but a 15-30% risk of mortality in the next 5 years. Intervention should only be considered for medically optimized patients with disabling symptoms.  ? ?Recommend the following which can slow the progression of atherosclerosis and reduce the risk of major adverse cardiac / limb events:  ?Complete cessation from all tobacco products. ?Blood glucose control with goal A1c < 7%. ?Blood pressure control with goal blood pressure < 140/90 mmHg. ?Lipid reduction therapy with goal LDL-C <100 mg/dL (<70 if symptomatic from PAD).  ?Aspirin '81mg'$  PO QD.  ?Atorvastatin 40-'80mg'$  PO QD (or other "high intensity" statin therapy). ?Daily walking to and past the point of discomfort. Patient counseled to keep a log of exercise distance. ? ?Follow-up with me in 3 to 6 months with repeat ABI for surveillance of symptoms.  The patient about symptoms of chronic limb threatening ischemia and encouraged him to return to care urgently should any of these develop. ?  ?CHIEF COMPLAINT: abnormal non-invasive vascular study ?  ?HISTORY OF PRESENT ILLNESS: ?Terry Munoz is a 71 y.o. male referred to ER for evaluation of abnormal noninvasive vascular studies.  The patient has a strong personal history of peripheral vascular disease.  He had superficial femoral artery stents placed in early 2017.  These acutely thrombosed causing acute limb ischemia.  Dr. Bridgett Larsson took him for thrombectomy of the right lower extremity from a popliteal approach, and performed endarterectomy of the popliteal and tibial vessels.  A saphenous vein patch was used.  The patient reports no preceding symptoms of chronic  ischemia to me. He did report some mild claudication symptoms to Dr. Einar Gip about a year ago. He reports short distance claudication began several days ago.  He has been compliant with Xarelto.  ?  ?06/14/21: Patient returns to clinic to discuss angiogram findings.  His symptoms are thankfully quite stable.  He reports he is able to walk around the house and about his property to the barn and back.  He continues to smoke cigarettes.  I counseled him again on the importance of quitting.  I counseled him on the importance of exercise therapy. ? ?07/28/21: patient returns to clinic. Symptoms are about the same. He is trying to exercise. He seems deconditioned. He continues to smoke and has a pack of Pall Mall's in his shirt pocket.  ? ?VASCULAR SURGICAL HISTORY:  ?Right SFA stenting (2016 by cardiology)  ?Right below knee popliteal and tibial thrombectomy and calf fasciotomy (Dr. Bridgett Larsson 07/24/15)  ?Patient lost to follow up after 12/17/15 ?  ?VASCULAR RISK FACTORS: ?Negative history of stroke / transient ischemic attack. ?Negative history of coronary artery disease.  ?Positive history of diabetes mellitus. Last A1c 7.7. ?Positive history of smoking. + actively smoking. ?Positive history of hypertension.  ?Negative history of chronic kidney disease.  Last GFR >60.  ?Positive history of chronic obstructive pulmonary disease. ?  ?FUNCTIONAL STATUS: ?ECOG performance status: (1) Restricted in physically strenuous activity, ambulatory and able to do work of light nature ?Ambulatory status: Ambulatory within the community with limits ?  ?Past Medical History:  ?Diagnosis Date  ? COPD (chronic obstructive pulmonary disease) ([place])   ? Diabetes mellitus without complication ([place])   ? INSULIN DEPENDENT  ? Hyperlipidemia  10/04/2019  ? Hypertension   ? Hyponatremia 03/2014  ? Insomnia   ? Kidney stones   ? Mouth ulcers   ? Osteoarthritis   ? Otitis media   ? Peripheral vascular disease ([place])   ? Sinusitis   ? ? ?  ?Past Surgical History:   ?Procedure Laterality Date  ? DENTAL SURGERY    ? FEMORAL-POPLITEAL BYPASS GRAFT Right 07/24/2015  ? Procedure: Thrombectomy Right Femoral-Popliteal Artery; Thrombectomy Right Anterior-Tibial Artery, Thrombectomy Right Tibial-Peroneal Trunk; Bovine Patch Angioplasty Right Popliteal Artery; Endarterectomy Right Anterior Tibial Artery and Right Tibial-Peroneal Trunk; Right Leg Angiogram, Four Compartment Fasciotomy Right Lower Leg;  Surgeon: Conrad Gurabo, MD;  Location: [facility];  Service: Vascular;  L  ? HERNIA REPAIR    ? lithotomy    ? LOWER EXTREMITY ANGIOGRAM N/A 05/01/2014  ? Procedure: LOWER EXTREMITY ANGIOGRAM;  Surgeon: Laverda Page, MD;  Location: [facility] CATH LAB;  Service: Cardiovascular;  Laterality: N/A;  ? LOWER EXTREMITY ANGIOGRAPHY N/A 05/27/2021  ? Procedure: Lower Extremity Angiography;  Surgeon: Cherre Robins, MD;  Location: [facility] CV LAB;  Service: Cardiovascular;  Laterality: N/A;  ? NECK SURGERY    ? POPLITEAL ARTERY ANGIOPLASTY Right 05/02/2014  ? to mid sfa  ? THROMBECTOMY  05/02/2014  ? ? ?  ?Family History  ?Problem Relation Age of Onset  ? Heart disease Mother   ? Hypertension Mother   ? Hypertension Father   ? Hypertension Sister   ? Hypertension Brother   ? Diabetes Brother   ? Hypertension Sister   ? ?Social History  ? ?Socioeconomic History  ? Marital status: Married  ?  Spouse name: Not on file  ? Number of children: 1  ? Years of education: Not on file  ? Highest education level: Not on file  ?Occupational History  ? Not on file  ?Tobacco Use  ? Smoking status: Every Day  ?  Packs/day: 2.00  ?  Years: 45.00  ?  Pack years: 90.00  ?  Types: Cigarettes  ? Smokeless tobacco: Never  ?Vaping Use  ? Vaping Use: Some days  ?Substance and Sexual Activity  ? Alcohol use: Yes  ?  Alcohol/week: 24.0 standard drinks  ?  Types: 24 Cans of beer per week  ?  Comment: daily  ? Drug use: No  ? Sexual activity: Not on file  ?Other Topics Concern  ? Not on file  ?Social History Narrative  ? Not on file   ? ?Social Determinants of Health  ? ?Financial Resource Strain: Not on file  ?Food Insecurity: Not on file  ?Transportation Needs: Not on file  ?Physical Activity: Not on file  ?Stress: Not on file  ?Social Connections: Not on file  ? ?  ?Allergies  ?Allergen Reactions  ? Sulfa Antibiotics Hives  ? ?Current Outpatient Medications on File Prior to Visit  ?Medication Sig Dispense Refill  ? albuterol (PROVENTIL HFA;VENTOLIN HFA) 108 (90 Base) MCG/ACT inhaler Inhale 1-2 puffs into the lungs every 6 (six) hours as needed for wheezing or shortness of breath.    ? albuterol (PROVENTIL) (2.5 MG/3ML) 0.083% nebulizer solution Take 2.5 mg by nebulization every 6 (six) hours as needed for wheezing or shortness of breath.     ? amitriptyline (ELAVIL) 50 MG tablet Take 50 mg by mouth at bedtime.    ? aspirin EC 81 MG tablet Take 1 tablet (81 mg total) by mouth daily. 90 tablet 3  ? atorvastatin (LIPITOR) 40 MG tablet TAKE 1  TABLET DAILY 90 tablet 3  ? budesonide-formoterol (SYMBICORT) 160-4.5 MCG/ACT inhaler Inhale 2 puffs into the lungs in the morning and at bedtime. 3 each 12  ? cyclobenzaprine (FLEXERIL) 10 MG tablet Take 10 mg by mouth daily as needed for muscle spasms.    ? folic acid (FOLVITE) 1 MG tablet Take 1 tablet (1 mg total) by mouth daily. 30 tablet 0  ? GLYXAMBI 10-5 MG TABS Take 1 tablet by mouth at bedtime.    ? insulin glargine (LANTUS) 100 UNIT/ML injection Inject 46 Units into the skin daily after breakfast.     ? insulin lispro (HUMALOG) 100 UNIT/ML injection Inject 3 Units into the skin 3 (three) times daily after meals.    ? irbesartan (AVAPRO) 150 MG tablet Take 150 mg by mouth daily.    ? Multiple Vitamin (MULTIVITAMIN WITH MINERALS) TABS tablet Take 1 tablet by mouth daily.    ? omeprazole (PRILOSEC) 20 MG capsule Take 20 mg by mouth daily as needed (heartburn).    ? rivaroxaban (XARELTO) 2.5 MG TABS tablet Take 1 tablet (2.5 mg total) by mouth 2 (two) times daily. 180 tablet 1  ? thiamine (VITAMIN  B-1) 50 MG tablet Take 150 mg by mouth daily.    ? ?No current facility-administered medications on file prior to visit.  ? ? ?  ?PHYSICAL EXAM ?There were no vitals taken for this visit. There were no vi

## 2021-07-28 ENCOUNTER — Ambulatory Visit (HOSPITAL_COMMUNITY)
Admission: RE | Admit: 2021-07-28 | Discharge: 2021-07-28 | Disposition: A | Discharge status: Home / Self Care | Payer: Medicare Other | Source: Ambulatory Visit | Attending: Vascular Surgery | Admitting: Vascular Surgery

## 2021-07-28 ENCOUNTER — Encounter (HOSPITAL_COMMUNITY): Payer: Medicare Other

## 2021-07-28 ENCOUNTER — Ambulatory Visit (INDEPENDENT_AMBULATORY_CARE_PROVIDER_SITE_OTHER): Payer: Medicare Other | Admitting: Vascular Surgery

## 2021-07-28 ENCOUNTER — Encounter: Payer: Self-pay | Admitting: Vascular Surgery

## 2021-07-28 VITALS — BP 127/81 | HR 99 | Temp 98.3°F | Resp 16 | Ht 68.0 in | Wt 159.0 lb

## 2021-07-28 DIAGNOSIS — I70211 Atherosclerosis of native arteries of extremities with intermittent claudication, right leg: Secondary | ICD-10-CM | POA: Diagnosis present

## 2021-07-28 DIAGNOSIS — I739 Peripheral vascular disease, unspecified: Secondary | ICD-10-CM | POA: Diagnosis present

## 2021-08-02 ENCOUNTER — Other Ambulatory Visit: Payer: Self-pay | Admitting: *Deleted

## 2021-08-02 DIAGNOSIS — I70211 Atherosclerosis of native arteries of extremities with intermittent claudication, right leg: Secondary | ICD-10-CM

## 2021-08-02 DIAGNOSIS — I739 Peripheral vascular disease, unspecified: Secondary | ICD-10-CM

## 2021-08-03 ENCOUNTER — Other Ambulatory Visit: Payer: Self-pay

## 2021-08-03 DIAGNOSIS — I70211 Atherosclerosis of native arteries of extremities with intermittent claudication, right leg: Secondary | ICD-10-CM

## 2021-11-14 ENCOUNTER — Other Ambulatory Visit: Payer: Self-pay | Admitting: Cardiology

## 2021-11-14 DIAGNOSIS — I739 Peripheral vascular disease, unspecified: Secondary | ICD-10-CM

## 2021-12-09 ENCOUNTER — Ambulatory Visit: Payer: Medicare Other | Admitting: Cardiology

## 2022-01-26 ENCOUNTER — Ambulatory Visit: Payer: Medicare Other | Admitting: Cardiology

## 2022-02-02 ENCOUNTER — Encounter: Payer: Self-pay | Admitting: Cardiology

## 2022-02-02 ENCOUNTER — Ambulatory Visit: Payer: Medicare Other | Admitting: Cardiology

## 2022-02-02 VITALS — BP 119/70 | HR 120 | Resp 16 | Ht 68.0 in | Wt 159.0 lb

## 2022-02-02 DIAGNOSIS — E7800 Pure hypercholesterolemia, unspecified: Secondary | ICD-10-CM

## 2022-02-02 DIAGNOSIS — I739 Peripheral vascular disease, unspecified: Secondary | ICD-10-CM

## 2022-02-02 DIAGNOSIS — I10 Essential (primary) hypertension: Secondary | ICD-10-CM

## 2022-02-02 DIAGNOSIS — F17209 Nicotine dependence, unspecified, with unspecified nicotine-induced disorders: Secondary | ICD-10-CM

## 2022-02-02 DIAGNOSIS — I70211 Atherosclerosis of native arteries of extremities with intermittent claudication, right leg: Secondary | ICD-10-CM

## 2022-02-02 MED ORDER — XARELTO 2.5 MG PO TABS: ORAL_TABLET | ORAL | 3 refills | Status: DC

## 2022-02-02 NOTE — Progress Notes (Signed)
Primary Physician/Referring:  Aletha Halim., PA-C  Patient ID: Terry Munoz, male    DOB: 05/09/1950, 71 y.o.   MRN: [id number]  Chief Complaint  Patient presents with   Hypertension   PAD   Follow-up    6 month    HPI:    Terry Munoz  is a 71 y.o. Caucasian male with greater than 100-pack-year history of smoking, hyperlipidemia, hypertension, diabetes mellitus, peripheral arterial disease history of Viabahn stent implantation to his right SFA on 05/01/2014 however presented on 07/24/2015 with acute limb ischemia needing emergent surgery with thrombectomy and patch angioplasty of the right popliteal artery.  Repeat angiography on 05/26/2021 had revealed occluded right SFA with single-vessel runoff below the right knee.  He also has history of PE on 02/25/2020 and has been on Xarelto since.  On his last office visit 6 months ago, I reduce the dose of the Xarelto from 20 mg to 2.5 mg of PAD along with continued aspirin 81 mg daily.  He now presents for follow-up.  He has reduced drinking alcohol to 4-5 cans of beer a day now and he also has reduced smoking cigarettes.  Except for chronic dyspnea and cough, no specific complaints otherwise.  Overall states that he is feeling the best he has in quite a while.  Past Medical History:  Diagnosis Date   COPD (chronic obstructive pulmonary disease) ([place])    Diabetes mellitus without complication ([place])    INSULIN DEPENDENT   Hyperlipidemia 10/04/2019   Hypertension    Hyponatremia 03/2014   Insomnia    Kidney stones    Mouth ulcers    Osteoarthritis    Otitis media    Peripheral vascular disease ([facility])    Sinusitis    Social History   Tobacco Use   Smoking status: Every Day    Packs/day: 2.00    Years: 45.00    Total pack years: 90.00    Types: Cigarettes   Smokeless tobacco: Never  Substance Use Topics   Alcohol use: Yes    Alcohol/week: 24.0 standard drinks of alcohol    Types: 24 Cans of beer per week    Comment:  daily  Married    ROS  Review of Systems  Cardiovascular:  Positive for claudication (minimal). Negative for chest pain and leg swelling.  Respiratory:  Positive for cough, shortness of breath and wheezing.   Gastrointestinal:  Negative for melena.   Objective  Blood pressure 119/70, pulse (!) 120, resp. rate 16, height _0  (1.727 m), weight 159 lb (72.1 kg), SpO2 95 %. Body mass index is 24.18 kg/m.      02/02/2022    2:17 PM 07/28/2021    9:06 AM 06/14/2021   11:31 AM  Vitals with BMI  Height _1  _2  _3   Weight 159 lbs 159 lbs 162 lbs  BMI 24.18 37.34 28.76  Systolic 811 572 620  Diastolic 70 81 75  Pulse 355 99 90     Physical Exam Constitutional:      Appearance: He is well-developed.  Neck:     Vascular: No JVD.  Cardiovascular:     Rate and Rhythm: Normal rate and regular rhythm.     Pulses:          Carotid pulses are 2+ on the right side and 2+ on the left side.      Femoral pulses are 2+ on the right side and 2+ on the left side.  Popliteal pulses are 0 on the right side and 1+ on the left side.       Dorsalis pedis pulses are 0 on the right side and 0 on the left side.       Posterior tibial pulses are 0 on the right side and 0 on the left side.     Heart sounds: Normal heart sounds. No murmur heard.    No gallop.  Pulmonary:     Effort: Pulmonary effort is normal.     Breath sounds: Rales (Bilateral bronchovesicular breath sounds with occasinal rales bilateral) present.  Abdominal:     General: Bowel sounds are normal.     Palpations: Abdomen is soft.     Comments: Reducible umbilical hernia present.  Musculoskeletal:     Right lower leg: No edema.     Left lower leg: No edema.    Radiology: No results found.  Laboratory examination:   Recent Labs    05/26/21 1652 05/27/21 0410  NA 139 138  K 4.3 3.7  CL 101 103  CO2 23 26  GLUCOSE 158* 60*  BUN 15 16  CREATININE 1.36* 1.23  CALCIUM 9.2 9.0  GFRNONAA 55* >60      Latest Ref  Rng & Units 05/27/2021    4:10 AM 05/26/2021    8:38 PM 05/26/2021    4:52 PM  CMP  Glucose 70 - 99 mg/dL 60   158   BUN 8 - 23 mg/dL 16   15   Creatinine 0.61 - 1.24 mg/dL 1.23   1.36   Sodium 135 - 145 mmol/L 138   139   Potassium 3.5 - 5.1 mmol/L 3.7   4.3   Chloride 98 - 111 mmol/L 103   101   CO2 22 - 32 mmol/L 26   23   Calcium 8.9 - 10.3 mg/dL 9.0   9.2   Total Protein 6.5 - 8.1 g/dL 6.5  6.7    Total Bilirubin 0.3 - 1.2 mg/dL 0.6  0.6    Alkaline Phos 38 - 126 U/L 66  69    AST 15 - 41 U/L 26  26    ALT 0 - 44 U/L 25  26        Latest Ref Rng & Units 05/27/2021    4:10 AM 05/26/2021    4:52 PM 03/21/2020    2:44 AM  CBC  WBC 4.0 - 10.5 K/uL 10.1  9.4  7.0   Hemoglobin 13.0 - 17.0 g/dL 15.7  17.1  14.1   Hematocrit 39.0 - 52.0 % 46.7  49.6  40.5   Platelets 150 - 400 K/uL 181  182  139    Lab Results  Component Value Date   CHOL 176 11/21/2018   HDL 102 11/21/2018   LDLCALC 62 11/21/2018   TRIG 60 11/21/2018   CHOLHDL 2.2 04/20/2014     HEMOGLOBIN A1C Lab Results  Component Value Date   HGBA1C 7.7 (H) 05/26/2021   MPG 174.29 05/26/2021   TSH Recent Labs    05/26/21 2038  TSH 2.885   External Labs:   Labs 10/13/2021:  Hb 16.9/HCT 49.7, platelets 226.  Sodium 135, potassium 5.0, BUN 17, creatinine 1.17, EGFR 65 mill, LFTs normal.  A1c 7.2%.  Total cholesterol 170, triglycerides 88, HDL 74, LDL 77.  Medications    Current Outpatient Medications:    albuterol (PROVENTIL HFA;VENTOLIN HFA) 108 (90 Base) MCG/ACT inhaler, Inhale 1-2 puffs into the lungs every 6 (six) hours  as needed for wheezing or shortness of breath., Disp: , Rfl:    albuterol (PROVENTIL) (2.5 MG/3ML) 0.083% nebulizer solution, Take 2.5 mg by nebulization every 6 (six) hours as needed for wheezing or shortness of breath. , Disp: , Rfl:    amitriptyline (ELAVIL) 50 MG tablet, Take 50 mg by mouth at bedtime., Disp: , Rfl:    aspirin EC 81 MG tablet, Take 1 tablet (81 mg total) by mouth  daily., Disp: 90 tablet, Rfl: 3   atorvastatin (LIPITOR) 40 MG tablet, TAKE 1 TABLET DAILY, Disp: 90 tablet, Rfl: 3   budesonide-formoterol (SYMBICORT) 160-4.5 MCG/ACT inhaler, Inhale 2 puffs into the lungs in the morning and at bedtime., Disp: 3 each, Rfl: 12   fluticasone (FLONASE) 50 MCG/ACT nasal spray, Place 2 sprays into both nostrils as needed., Disp: , Rfl:    folic acid (FOLVITE) 1 MG tablet, Take 1 tablet (1 mg total) by mouth daily., Disp: 30 tablet, Rfl: 0   GLYXAMBI 10-5 MG TABS, Take 1 tablet by mouth at bedtime., Disp: , Rfl:    insulin glargine (LANTUS) 100 UNIT/ML injection, Inject 46 Units into the skin daily after breakfast. , Disp: , Rfl:    insulin lispro (HUMALOG) 100 UNIT/ML injection, Inject 3 Units into the skin 3 (three) times daily after meals., Disp: , Rfl:    irbesartan (AVAPRO) 75 MG tablet, Take 75 mg by mouth daily., Disp: , Rfl:    Multiple Vitamin (MULTIVITAMIN WITH MINERALS) TABS tablet, Take 1 tablet by mouth daily., Disp: , Rfl:    omeprazole (PRILOSEC) 20 MG capsule, Take 20 mg by mouth daily as needed (heartburn)., Disp: , Rfl:    thiamine (VITAMIN B-1) 50 MG tablet, Take 150 mg by mouth daily., Disp: , Rfl:    rivaroxaban (XARELTO) 2.5 MG TABS tablet, TAKE 1 TABLET TWICE A DAY, Disp: 180 tablet, Rfl: 3     Cardiac Studies:   Echocardiogram 04/14/2014:  Left ventricle cavity is normal in size. Normal global wall motion. Normal diastolic filling pattern. Visual EF is 55-60%. Calculated EF 51%. Trace mitral regurgitation. Trace tricuspid regurgitation. Trace pulmonic regurgitation. Essentially normal echocardiogram.  Lexiscan myoview stress test 04/13/2014: 1. The resting electrocardiogram demonstrated normal sinus rhythm, normal resting conduction, poor R progression, cannot r/o anterior infarct, old. No resting arrhythmias and normal rest repolarization.  Stress EKG is nondiagnostic for ischemia as it is a Pharmacologic stress test using Lexiscan infusion.  Stress symptoms included dyspnea, dizziness. 2. Myocardial perfusion imaging is normal. Overall left ventricular systolic function was normal without regional wall motion abnormalities. The left ventricular ejection fraction was 74%.  PV angio 05/01/2014: stenting of the right proximal to mid SFA with implantation of 2 overlapping 6.0 x 150 and a 6 x 100 mm Viabahn covered stents. One vessel R/O (PT) right leg. Mild disease left leg.  Lower extremity arterial duplex 06/14/2016: No hemodynamically significant stenoses are identified in the lower extremity arterial system. This exam reveals mildly decreased perfusion of the  lower extremity, LABI 0.87 and RABI 0.90 withnormal triphasic wavefroms noted at the post tibial artery level.  Compared to the study done on 06/15/2015, no significant change, ABI mildly reduced from 0.95, may be due to technical error.  Right SFA stent appears patent.  EKG:  EKG 02/02/2022: Sinus tachycardia at rate of 114 bpm, left axis deviation, left anterior fascicular block.  Incomplete right bundle branch block.  Poor R wave progression, cannot exclude anteroseptal infarct old.  Low-voltage complexes, pulmonary disease pattern. No change  from 06/03/2021.  Assessment     ICD-10-CM   1. Atherosclerosis of native artery of right lower extremity with intermittent claudication ([place])  I70.211     2. Essential hypertension  I10 EKG 12-Lead    3. Tobacco use disorder, continuous  F17.209     4. Hypercholesteremia  E78.00     5. PAD (peripheral artery disease) ([facility])  I73.9 rivaroxaban (XARELTO) 2.5 MG TABS tablet      Meds ordered this encounter  Medications   rivaroxaban (XARELTO) 2.5 MG TABS tablet    Sig: TAKE 1 TABLET TWICE A DAY    Dispense:  180 tablet    Refill:  3   Medications Discontinued During This Encounter  Medication Reason   cyclobenzaprine (FLEXERIL) 10 MG tablet    XARELTO 2.5 MG TABS tablet Reorder   Orders Placed This Encounter  Procedures    EKG 12-Lead       Recommendations:   Terry Munoz  is a 71 y.o. Caucasian male with greater than 100-pack-year history of smoking, hyperlipidemia, hypertension, diabetes mellitus, peripheral arterial disease history of Viabahn stent implantation to his right SFA on 05/01/2014 however presented on 07/24/2015 with acute limb ischemia needing emergent surgery with thrombectomy and patch angioplasty of the right popliteal artery.  Repeat angiography on 05/26/2021 had revealed occluded right SFA with single-vessel runoff below the right knee.  He also has history of PE on 02/25/2020 and has been on Xarelto since.  On his last office visit 6 months ago, I reduce the dose of the Xarelto from 20 mg to 2.5 mg of PAD along with continued aspirin 81 mg daily.  He now presents for follow-up.  1. Atherosclerosis of native artery of right lower extremity with intermittent claudication ([place]) Patient symptoms of claudication remained very stable.  There is no critical ischemia.  There is no open wounds.  For now continue present medical management, patient states that he has noticed improvement in claudication symptoms since last time he was here 6 months ago.  2. Essential hypertension Blood pressure is very well controlled, he is on ARB, continue the same.  Renal function has remained stable at stage IIIa chronic kidney disease.  3. Tobacco use disorder, continuous He still smokes however he is trying to reduce the amount of smoking, reinforced compliance and also abstinence from tobacco use.  4. Hypercholesteremia Lipids are under excellent control.  He is presently on high intensity high-dose statin, atorvastatin 40 mg daily, continue the same.  Overall stable cardiac status, I will see him back in a year or sooner if problems.     Terry Prows, MD, [facility] 02/02/2022, 3:06 PM Office: [phone] Fax: [phone] Pager: [phone]

## 2022-03-28 ENCOUNTER — Emergency Department (HOSPITAL_COMMUNITY): Payer: Medicare Other

## 2022-03-28 ENCOUNTER — Other Ambulatory Visit: Payer: Self-pay

## 2022-03-28 ENCOUNTER — Encounter (HOSPITAL_COMMUNITY): Payer: Self-pay

## 2022-03-28 ENCOUNTER — Emergency Department (HOSPITAL_COMMUNITY)
Admission: EM | Admit: 2022-03-28 | Discharge: 2022-03-28 | Disposition: A | Discharge status: Home / Self Care | Payer: Medicare Other | Attending: Emergency Medicine | Admitting: Emergency Medicine

## 2022-03-28 DIAGNOSIS — R55 Syncope and collapse: Secondary | ICD-10-CM

## 2022-03-28 DIAGNOSIS — X58XXXA Exposure to other specified factors, initial encounter: Secondary | ICD-10-CM | POA: Diagnosis not present

## 2022-03-28 DIAGNOSIS — S0990XA Unspecified injury of head, initial encounter: Secondary | ICD-10-CM | POA: Diagnosis present

## 2022-03-28 DIAGNOSIS — J449 Chronic obstructive pulmonary disease, unspecified: Secondary | ICD-10-CM | POA: Diagnosis not present

## 2022-03-28 DIAGNOSIS — Z7901 Long term (current) use of anticoagulants: Secondary | ICD-10-CM | POA: Diagnosis not present

## 2022-03-28 DIAGNOSIS — S0101XA Laceration without foreign body of scalp, initial encounter: Secondary | ICD-10-CM | POA: Insufficient documentation

## 2022-03-28 DIAGNOSIS — Z7982 Long term (current) use of aspirin: Secondary | ICD-10-CM | POA: Diagnosis not present

## 2022-03-28 LAB — CBC WITH DIFFERENTIAL/PLATELET
Abs Immature Granulocytes: 0.04 10*3/uL (ref 0.00–0.07)
Basophils Absolute: 0 10*3/uL (ref 0.0–0.1)
Basophils Relative: 0 %
Eosinophils Absolute: 0.2 10*3/uL (ref 0.0–0.5)
Eosinophils Relative: 2 %
HCT: 45.9 % (ref 39.0–52.0)
Hemoglobin: 15.5 g/dL (ref 13.0–17.0)
Immature Granulocytes: 0 %
Lymphocytes Relative: 11 %
Lymphs Abs: 1.1 10*3/uL (ref 0.7–4.0)
MCH: 33.9 pg (ref 26.0–34.0)
MCHC: 33.8 g/dL (ref 30.0–36.0)
MCV: 100.4 fL — ABNORMAL HIGH (ref 80.0–100.0)
Monocytes Absolute: 0.7 10*3/uL (ref 0.1–1.0)
Monocytes Relative: 7 %
Neutro Abs: 7.9 10*3/uL — ABNORMAL HIGH (ref 1.7–7.7)
Neutrophils Relative %: 80 %
Platelets: 207 10*3/uL (ref 150–400)
RBC: 4.57 MIL/uL (ref 4.22–5.81)
RDW: 13 % (ref 11.5–15.5)
WBC: 9.9 10*3/uL (ref 4.0–10.5)
nRBC: 0 % (ref 0.0–0.2)

## 2022-03-28 LAB — BASIC METABOLIC PANEL
Anion gap: 14 (ref 5–15)
BUN: 17 mg/dL (ref 8–23)
CO2: 26 mmol/L (ref 22–32)
Calcium: 9.3 mg/dL (ref 8.9–10.3)
Chloride: 97 mmol/L — ABNORMAL LOW (ref 98–111)
Creatinine, Ser: 1.23 mg/dL (ref 0.61–1.24)
GFR, Estimated: >60 mL/min (ref >60)
Glucose, Bld: 145 mg/dL — ABNORMAL HIGH (ref 70–99)
Potassium: 4 mmol/L (ref 3.5–5.1)
Sodium: 137 mmol/L (ref 135–145)

## 2022-03-28 MED ORDER — LACTATED RINGERS IV BOLUS
1000.0000 mL | Freq: Once | INTRAVENOUS | Status: AC
  Administered 2022-03-28: 1000 mL via INTRAVENOUS

## 2022-03-28 MED ORDER — LIDOCAINE-EPINEPHRINE (PF) 2 %-1:200000 IJ SOLN
20.0000 mL | Freq: Once | INTRAMUSCULAR | Status: DC
  Filled 2022-03-28: qty 20

## 2022-03-28 NOTE — ED Notes (Signed)
Received verbal report from Alana B RN at this time °

## 2022-03-28 NOTE — Discharge Instructions (Addendum)
Follow-up with your primary care physician in regards to the passing out.  It is important to stop smoking and follow-up with your doctor regarding your chronic cough.  Otherwise, the staples need to be removed in about 7 days, which your primary care physician or an urgent care can do.  Otherwise, if you develop headache, vomiting, confusion, all signs of infection at the wound, or any other new/concerning symptoms then return to the ER or call 911.

## 2022-03-28 NOTE — Progress Notes (Signed)
Orthopedic Tech Progress Note Patient Details:  Terry Munoz [birth year] [id number] Level 2 Trauma Patient ID: [name], male   DOB: [birth year], 72 y.o.   MRN: [id number]  [name] 03/28/2022, 6:46 PM

## 2022-03-28 NOTE — ED Notes (Signed)
Patient transported to CT by this RN 

## 2022-03-28 NOTE — ED Provider Notes (Signed)
[facility] EMERGENCY DEPARTMENT Provider Note   CSN: [id number] Arrival date & time: 03/28/22  1744     History  Chief Complaint  Patient presents with   Trauma    Fall on thinners    Terry Munoz is a 72 y.o. male.  HPI 72 year old male with a history of COPD as well as a history of prior PE a couple years ago, currently on Xarelto, presents with syncope and head injury.  He was brought in as a level 2 trauma due to the head injury on blood thinner.  History is initially from paramedic and then the patient.  He was coughing and coughed so hard that he ended up passing out.  The patient tells me the cough is a chronic cough from COPD and his wife reports that he still smokes.  The patient denies any preceding symptoms but was coughing and then neck thing he knew he woke up with people surrounding him.  He denies any current headache, chest pain, neck pain, trouble breathing.  No recent infectious symptoms.  Home Medications Prior to Admission medications   Medication Sig Start Date End Date Taking? Authorizing Provider  albuterol (PROVENTIL HFA;VENTOLIN HFA) 108 (90 Base) MCG/ACT inhaler Inhale 1-2 puffs into the lungs every 6 (six) hours as needed for wheezing or shortness of breath.    [provider]  albuterol (PROVENTIL) (2.5 MG/3ML) 0.083% nebulizer solution Take 2.5 mg by nebulization every 6 (six) hours as needed for wheezing or shortness of breath.     [provider]  amitriptyline (ELAVIL) 50 MG tablet Take 50 mg by mouth at bedtime.    [provider]  aspirin EC 81 MG tablet Take 1 tablet (81 mg total) by mouth daily. 06/03/21   Adrian Prows, MD  atorvastatin (LIPITOR) 40 MG tablet TAKE 1 TABLET DAILY 07/07/21   Adrian Prows, MD  budesonide-formoterol [facility]) 160-4.5 MCG/ACT inhaler Inhale 2 puffs into the lungs in the morning and at bedtime. 06/03/20   Adrian Prows, MD  fluticasone (FLONASE) 50 MCG/ACT nasal spray Place 2 sprays  into both nostrils as needed. 08/17/21   [provider]  folic acid (FOLVITE) 1 MG tablet Take 1 tablet (1 mg total) by mouth daily. 03/22/20   Pahwani, Michell Heinrich, MD  GLYXAMBI 10-5 MG TABS Take 1 tablet by mouth at bedtime. 04/09/19   [provider]  insulin glargine (LANTUS) 100 UNIT/ML injection Inject 46 Units into the skin daily after breakfast.     [provider]  insulin lispro (HUMALOG) 100 UNIT/ML injection Inject 3 Units into the skin 3 (three) times daily after meals.    [provider]  irbesartan (AVAPRO) 75 MG tablet Take 75 mg by mouth daily.    [provider]  Multiple Vitamin (MULTIVITAMIN WITH MINERALS) TABS tablet Take 1 tablet by mouth daily. 03/22/20   Pahwani, Michell Heinrich, MD  omeprazole (PRILOSEC) 20 MG capsule Take 20 mg by mouth daily as needed (heartburn).    [provider]  rivaroxaban (XARELTO) 2.5 MG TABS tablet TAKE 1 TABLET TWICE A DAY 02/02/22   Adrian Prows, MD  thiamine (VITAMIN B-1) 50 MG tablet Take 150 mg by mouth daily.    [provider]      Allergies    Sulfa antibiotics    Review of Systems   Review of Systems  Constitutional:  Negative for fever.  Respiratory:  Positive for cough. Negative for shortness of breath.   Cardiovascular:  Negative for chest pain.  Gastrointestinal:  Negative for diarrhea and vomiting.  Neurological:  Positive for syncope. Negative for light-headedness and headaches.    Physical Exam Updated Vital Signs BP 139/78   Pulse 100   Temp 97.6 F (36.4 C) (Oral)   Resp 13   Ht '5\' 8"'$  (1.727 m)   Wt 72.1 kg   SpO2 94%   BMI 24.17 kg/m  Physical Exam Vitals and nursing note reviewed.  Constitutional:      Appearance: He is well-developed.  HENT:     Head: Normocephalic. Laceration present.   Eyes:     Extraocular Movements: Extraocular movements intact.     Pupils: Pupils are equal, round, and reactive to light.  Cardiovascular:     Rate and Rhythm:  Regular rhythm. Tachycardia present.     Heart sounds: Normal heart sounds.  Pulmonary:     Effort: Pulmonary effort is normal.     Breath sounds: Normal breath sounds.  Abdominal:     General: There is no distension.     Palpations: Abdomen is soft.     Tenderness: There is no abdominal tenderness.  Musculoskeletal:     Cervical back: No spinous process tenderness or muscular tenderness.  Skin:    General: Skin is warm and dry.  Neurological:     Mental Status: He is alert and oriented to person, place, and time.     Comments: CN 3-12 grossly intact. 5/5 strength in all 4 extremities. Grossly normal sensation. Normal finger to nose.     ED Results / Procedures / Treatments   Labs (all labs ordered are listed, but only abnormal results are displayed) Labs Reviewed  BASIC METABOLIC PANEL - Abnormal; Notable for the following components:      Result Value   Chloride 97 (*)    Glucose, Bld 145 (*)    All other components within normal limits  CBC WITH DIFFERENTIAL/PLATELET - Abnormal; Notable for the following components:   MCV 100.4 (*)    Neutro Abs 7.9 (*)    All other components within normal limits    EKG EKG Interpretation  Date/Time:  Tuesday March 28 2022 17:59:05 EST Ventricular Rate:  108 PR Interval:  158 QRS Duration: 89 QT Interval:  327 QTC Calculation: 439 R Axis:   -78 Text Interpretation: Sinus tachycardia Inferior infarct, old Consider anterior infarct no acute ST/T changes Confirmed by Sherwood Gambler [phone]) on 03/28/2022 6:05:01 PM  Radiology CT Head Wo Contrast  Result Date: 03/28/2022 CLINICAL DATA:  Head trauma, level 2.  Laceration back of head EXAM: CT HEAD WITHOUT CONTRAST TECHNIQUE: Contiguous axial images were obtained from the base of the skull through the vertex without intravenous contrast. RADIATION DOSE REDUCTION: This exam was performed according to the departmental dose-optimization program which includes automated exposure control,  adjustment of the mA and/or kV according to patient size and/or use of iterative reconstruction technique. COMPARISON:  None Available. FINDINGS: Brain: No acute intracranial hemorrhage. No focal mass lesion. No CT evidence of acute infarction. No midline shift or mass effect. No hydrocephalus. Basilar cisterns are patent. There are periventricular and subcortical white matter hypodensities. Generalized cortical atrophy. Vascular: No hyperdense vessel or unexpected calcification. Skull: Normal. Negative for fracture or focal lesion. Sinuses/Orbits: Paranasal sinuses and mastoid air cells are clear. Orbits are clear. Other: Shallow scalp laceration of the posterior LEFT parietal bone (image 24/3) IMPRESSION: 1. Posterior LEFT scalp laceration.  No skull fracture. 2. No acute intracranial findings Electronically Signed  By: Suzy Bouchard M.D.   On: 03/28/2022 18:17   DG Chest Portable 1 View  Result Date: 03/28/2022 CLINICAL DATA:  Syncope. EXAM: PORTABLE CHEST 1 VIEW COMPARISON:  05/26/2021 FINDINGS: Heart size is normal. Aortic atherosclerotic calcifications. Lungs appear hyperinflated with interstitial changes of emphysema. No pleural effusion or edema identified. Visualized osseous structures are unremarkable. IMPRESSION: No active disease. Electronically Signed   By: Kerby Moors M.D.   On: 03/28/2022 18:10    Procedures .Marland KitchenLaceration Repair  Date/Time: 03/28/2022 6:52 PM  Performed by: Sherwood Gambler, MD Authorized by: Sherwood Gambler, MD   Consent:    Consent obtained:  Verbal   Consent given by:  Patient Anesthesia:    Anesthesia method:  Local infiltration   Local anesthetic:  Lidocaine 2% WITH epi Laceration details:    Location:  Scalp   Scalp location:  Occipital   Length (cm):  9 Pre-procedure details:    Preparation:  Patient was prepped and draped in usual sterile fashion and imaging obtained to evaluate for foreign bodies Exploration:    Hemostasis achieved with:  Direct  pressure Treatment:    Area cleansed with:  Saline   Amount of cleaning:  Extensive   Irrigation solution:  Sterile saline   Irrigation method:  Syringe Skin repair:    Repair method:  Staples   Number of staples:  9 Approximation:    Approximation:  Close Repair type:    Repair type:  Simple Post-procedure details:    Dressing:  Open (no dressing)   Procedure completion:  Tolerated well, no immediate complications     Medications Ordered in ED Medications  lidocaine-EPINEPHrine (XYLOCAINE W/EPI) 2 %-1:200000 (PF) injection 20 mL (has no administration in time range)  lactated ringers bolus 1,000 mL (0 mLs Intravenous Stopped 03/28/22 2016)    ED Course/ Medical Decision Making/ A&P                           Medical Decision Making Amount and/or Complexity of Data Reviewed Independent Historian: spouse and EMS External Data Reviewed: notes. Labs: ordered.    Details: No anemia or AKI Radiology: ordered and independent interpretation performed.    Details: No head bleed on CT ECG/medicine tests: ordered and independent interpretation performed.    Details: Sinus tachycardia, no ischemia  Risk Prescription drug management.   Patient's syncope was probably from the coughing. EKG without obvious arrhythmia (besides sinus tachycardia). Doubt ACS, concerning arrythmia, PE, stroke, etc. He was tachycardic initially on arrival, but likely more from the situation than anything else. VS are now normal. Otherwise, has a 9 cm linear laceration. Cleaned and repaired as above. No head bleed on CT. Labs unremarkable. I did discuss potential admission for syncope observation/workup with patient and family, but he declines. Encouraged him to follow up with PCP. Will give return precautions. Tdap up to date per patient.         Final Clinical Impression(s) / ED Diagnoses Final diagnoses:  Cough syncope  Laceration of scalp, initial encounter    Rx / DC Orders ED Discharge Orders      None         Sherwood Gambler, MD 03/28/22 2136

## 2022-03-28 NOTE — ED Triage Notes (Signed)
Pt arrived via [facility], pt was coughing then had a syncopal episode from stanidng position. Pt takes eliquis and ASA. Per EMS, pt has 3 inch lac on posterior of head. Pt is A&Ox4.

## 2022-05-23 ENCOUNTER — Ambulatory Visit (HOSPITAL_COMMUNITY): Payer: Medicare Other

## 2022-05-23 ENCOUNTER — Ambulatory Visit: Payer: Medicare Other | Admitting: Vascular Surgery

## 2022-07-20 ENCOUNTER — Telehealth: Payer: Self-pay

## 2022-07-20 ENCOUNTER — Other Ambulatory Visit: Payer: Self-pay

## 2022-07-20 MED ORDER — IRBESARTAN 150 MG PO TABS: 150.0000 mg | ORAL_TABLET | Freq: Every day | ORAL | 3 refills | Status: DC

## 2022-07-20 NOTE — Telephone Encounter (Signed)
Sent 150 mg irbesartan

## 2022-07-20 NOTE — Telephone Encounter (Signed)
Patient asking for irbesartan 150 mg said you prescribed it but I only see 75 mg please advise if ok to send 150 mg

## 2022-07-20 NOTE — Telephone Encounter (Signed)
150 is fine

## 2022-08-01 ENCOUNTER — Telehealth: Payer: Self-pay

## 2022-08-01 NOTE — Telephone Encounter (Signed)
Patients wife is calling, because the patient is having a tooth pulled and they need to know how long before extraction to stop taking his Xarelto.  Cal back number: [phone]

## 2022-08-01 NOTE — Telephone Encounter (Signed)
The dentist do not hold Xarelto, but if needed to hold, stop one night before and restart same day after tooth pull

## 2022-08-02 NOTE — Telephone Encounter (Signed)
Patient wife called asking about holding the xarelto. Informed her Dr. Jacinto Halim mention that he does not need to hold it but if the dentist wants him to hold it then his needs to stop it the night before and restart it the same day he gets his tooth pulled. Patient wife understood

## 2023-01-31 ENCOUNTER — Ambulatory Visit: Payer: Self-pay | Admitting: Cardiology

## 2023-03-22 ENCOUNTER — Other Ambulatory Visit: Payer: Self-pay | Admitting: Cardiology

## 2023-03-22 DIAGNOSIS — I739 Peripheral vascular disease, unspecified: Secondary | ICD-10-CM

## 2023-05-21 ENCOUNTER — Other Ambulatory Visit: Payer: Self-pay | Admitting: Gastroenterology

## 2023-05-21 ENCOUNTER — Telehealth: Payer: Self-pay | Admitting: *Deleted

## 2023-05-21 DIAGNOSIS — R1310 Dysphagia, unspecified: Secondary | ICD-10-CM

## 2023-05-21 NOTE — Telephone Encounter (Signed)
   Name: Terry Munoz  DOB: 1950-09-13  MRN: [id number]  Primary Cardiologist: None  Chart reviewed as part of pre-operative protocol coverage. The patient has an upcoming visit scheduled with Dr. Jacinto Halim on 05/22/2023 at which time clearance can be addressed in case there are any issues that would impact surgical recommendations.   I added preop FYI to appointment note so that provider is aware to address at time of outpatient visit.  Per office protocol the cardiology provider should forward their finalized clearance decision and recommendations regarding antiplatelet therapy to the requesting party below.    This message will also be routed to pharmacy pool  for input on holding Xarelto as requested below so that this information is available to the clearing provider at time of patient's appointment.   I will route this message as FYI to requesting party and remove this message from the preop box as separate preop APP input not needed at this time.   Please call with any questions.  Napoleon Form, Leodis Rains, NP  05/21/2023, 12:30 PM

## 2023-05-21 NOTE — Telephone Encounter (Signed)
   Pre-operative Risk Assessment    Patient Name: Terry Munoz  DOB: [birth year] MRN: [id number]   Date of last office visit: 02/02/2022 Date of next office visit: 05/21/2022 Pre Op added to upcoming appt notes.  Request for Surgical Clearance    Procedure:   EGD  Date of Surgery:  Clearance 06/15/23                                 Surgeon:  Dr. Jeani Hawking Surgeon's Group or Practice Name:  [facility] Phone number:  [phone] Fax number:  [phone]   Type of Clearance Requested:   - Medical  - Pharmacy:  Hold Aspirin and Rivaroxaban (Xarelto) Not Indicated   Type of Anesthesia:   Propofol   Additional requests/questions:    Signed, Emmit Pomfret   05/21/2023, 12:13 PM

## 2023-05-22 ENCOUNTER — Ambulatory Visit: Payer: Medicare Other | Attending: Cardiology | Admitting: Cardiology

## 2023-05-22 ENCOUNTER — Encounter: Payer: Self-pay | Admitting: Cardiology

## 2023-05-22 VITALS — BP 138/82 | HR 99 | Resp 16 | Ht 68.0 in | Wt 153.2 lb

## 2023-05-22 DIAGNOSIS — I10 Essential (primary) hypertension: Secondary | ICD-10-CM | POA: Diagnosis not present

## 2023-05-22 DIAGNOSIS — F17209 Nicotine dependence, unspecified, with unspecified nicotine-induced disorders: Secondary | ICD-10-CM | POA: Insufficient documentation

## 2023-05-22 DIAGNOSIS — I70211 Atherosclerosis of native arteries of extremities with intermittent claudication, right leg: Secondary | ICD-10-CM | POA: Diagnosis not present

## 2023-05-22 DIAGNOSIS — E7800 Pure hypercholesterolemia, unspecified: Secondary | ICD-10-CM | POA: Insufficient documentation

## 2023-05-22 NOTE — Patient Instructions (Signed)

## 2023-05-22 NOTE — Telephone Encounter (Signed)
 Patient with diagnosis of PE on Xarelto for anticoagulation.  Overdue for office visit.  Procedure: EGD Date of procedure: 06/15/23  CrCl 56 ml/min Platelet count 198K  Per office protocol, patient can hold Xarelto for 1 day prior to procedure.    **This guidance is not considered finalized until pre-operative APP has relayed final recommendations.**

## 2023-05-22 NOTE — Progress Notes (Signed)
 Cardiology Office Note:  .   Date:  05/22/2023  ID:  KHI MCMILLEN, DOB 12-Aug-1950, MRN [id number] PCP: Richmond Campbell PA-C  [facility] Providers Cardiologist:  Yates Decamp, MD   History of Present Illness: Terry Munoz   Terry Munoz is a 73 y.o. Caucasian male with greater than 100-pack-year history of smoking, hyperlipidemia, hypertension, uncontrolled diabetes mellitus, peripheral arterial disease history of Viabahn stent implantation to his right SFA on 05/01/2014 however presented on 07/24/2015 with acute limb ischemia needing emergent surgery with thrombectomy and patch angioplasty of the right popliteal artery.  Repeat angiography on 05/26/2021 had revealed occluded right SFA with single-vessel runoff below the right knee.   He also has history of PE on 02/25/2020 and has been on Xarelto since, dose of the Xarelto was reduced from 20 mg to 2.5 mg of PAD along with continued aspirin 81 mg daily after he completed 67-month course.     He has reduced drinking alcohol to 4-5 cans of beer a day now and he also has reduced smoking cigarettes.  Except for chronic dyspnea and cough, no specific complaints otherwise.  Overall states that he is feeling the best he has in quite a while.  He has developed dysphagia for which he has been scheduled for endoscopy by Dr. Jeani Hawking and needs preoperative clearance.  Patient states that he has had chronic dyspnea and occasional leg cramps, otherwise states that he has been fairly active, no rest pain, no ulcerations.  Denies chest pain, palpitation.  Dyspnea has remained stable.  No PND or orthopnea or leg edema.  Discussed the use of AI scribe software for clinical note transcription with the patient, who gave verbal consent to proceed.   Labs    Lab Results  Component Value Date   NA 137 03/28/2022   K 4.0 03/28/2022   CO2 26 03/28/2022   GLUCOSE 145 (H) 03/28/2022   BUN 17 03/28/2022   CREATININE 1.23 03/28/2022   CALCIUM 9.3 03/28/2022    GFRNONAA >60 03/28/2022      Latest Ref Rng & Units 03/28/2022    6:15 PM 05/27/2021    4:10 AM 05/26/2021    4:52 PM  BMP  Glucose 70 - 99 mg/dL 782  60  956   BUN 8 - 23 mg/dL 17  16  15    Creatinine 0.61 - 1.24 mg/dL 2.13  0.86  5.78   Sodium 135 - 145 mmol/L 137  138  139   Potassium 3.5 - 5.1 mmol/L 4.0  3.7  4.3   Chloride 98 - 111 mmol/L 97  103  101   CO2 22 - 32 mmol/L 26  26  23    Calcium 8.9 - 10.3 mg/dL 9.3  9.0  9.2       Latest Ref Rng & Units 03/28/2022    6:15 PM 05/27/2021    4:10 AM 05/26/2021    4:52 PM  CBC  WBC 4.0 - 10.5 K/uL 9.9  10.1  9.4   Hemoglobin 13.0 - 17.0 g/dL 46.9  62.9  52.8   Hematocrit 39.0 - 52.0 % 45.9  46.7  49.6   Platelets 150 - 400 K/uL 207  181  182    Lab Results  Component Value Date   TSH 2.885 05/26/2021    External Labs:  Care Everywhere labs 04/23/2023:  A1c 8.4%.  Sodium 138, potassium 4.2, BUN 14, creatinine 1.19, EGFR 64 mL, serum glucose 154 mg, LFTs normal.  Labs 12/06/2022:  Total cholesterol 190, triglycerides 54, HDL 108, LDL 69.  =Review of Systems  Cardiovascular:  Positive for claudication (stable and mild) and dyspnea on exertion. Negative for chest pain and leg swelling.  Respiratory:  Positive for cough.    Physical Exam:   VS:  BP 138/82 (BP Location: Left Arm, Patient Position: Sitting, Cuff Size: Normal)   Pulse 99   Resp 16   Ht 5\' 8"  (1.727 m)   Wt 153 lb 3.2 oz (69.5 kg)   SpO2 94%   BMI 23.29 kg/m    Wt Readings from Last 3 Encounters:  05/22/23 153 lb 3.2 oz (69.5 kg)  03/28/22 158 lb 15.2 oz (72.1 kg)  02/02/22 159 lb (72.1 kg)     Physical Exam Neck:     Vascular: No carotid bruit or JVD.  Cardiovascular:     Rate and Rhythm: Normal rate and regular rhythm.     Pulses: Intact distal pulses.          Popliteal pulses are 0 on the right side and 0 on the left side.       Dorsalis pedis pulses are 0 on the right side and 1+ on the left side.       Posterior tibial pulses are 0 on the  right side and 0 on the left side.     Heart sounds: Heart sounds are distant. No murmur heard.    No gallop.  Pulmonary:     Effort: Pulmonary effort is normal.     Breath sounds: Rales (scattered) present.  Abdominal:     General: Bowel sounds are normal.     Palpations: Abdomen is soft.  Musculoskeletal:     Right lower leg: No edema.     Left lower leg: No edema.  Skin:    Capillary Refill: Capillary refill takes 2 to 3 seconds.    Studies Reviewed: Terry Munoz     EKG:    EKG Interpretation Date/Time:  Tuesday May 22 2023 14:13:07 EST Ventricular Rate:  98 PR Interval:  162 QRS Duration:  80 QT Interval:  358 QTC Calculation: 457 R Axis:   -70  Text Interpretation: Normal sinus rhythm with rate of 98 bpm, left anterior fascicular block, cannot exclude inferior infarct old.  Incomplete right bundle branch block. Poor R progression, cannot exclude anteroseptal infarct old.  Low-voltage complexes..  Compared to 03/28/2022, no significant change. Confirmed by Delrae Rend [phone]) on 05/22/2023 2:16:44 PM    EKG 02/02/2022: Sinus tachycardia at rate of 114 bpm, left axis deviation, left anterior fascicular block. Incomplete right bundle branch block. Poor R wave progression, cannot exclude anteroseptal infarct old. Low-voltage complexes, pulmonary disease pattern.   Medications and allergies    Allergies  Allergen Reactions   Sulfa Antibiotics Hives     Current Outpatient Medications:    albuterol (PROVENTIL HFA;VENTOLIN HFA) 108 (90 Base) MCG/ACT inhaler, Inhale 1-2 puffs into the lungs every 6 (six) hours as needed for wheezing or shortness of breath., Disp: , Rfl:    albuterol (PROVENTIL) (2.5 MG/3ML) 0.083% nebulizer solution, Take 2.5 mg by nebulization every 6 (six) hours as needed for wheezing or shortness of breath. , Disp: , Rfl:    amitriptyline (ELAVIL) 50 MG tablet, Take 50 mg by mouth at bedtime., Disp: , Rfl:    Apoaequorin (PREVAGEN) 10 MG CAPS, Take 10 mg by  mouth daily., Disp: , Rfl:    aspirin EC 81 MG tablet, Take 1 tablet (81 mg total) by mouth daily.,  Disp: 90 tablet, Rfl: 3   atorvastatin (LIPITOR) 40 MG tablet, TAKE 1 TABLET DAILY, Disp: 90 tablet, Rfl: 3   budesonide-formoterol (SYMBICORT) 160-4.5 MCG/ACT inhaler, Inhale 2 puffs into the lungs in the morning and at bedtime., Disp: 3 each, Rfl: 12   fluticasone (FLONASE) 50 MCG/ACT nasal spray, Place 2 sprays into both nostrils as needed., Disp: , Rfl:    folic acid (FOLVITE) 1 MG tablet, Take 1 tablet (1 mg total) by mouth daily., Disp: 30 tablet, Rfl: 0   GLYXAMBI 10-5 MG TABS, Take 1 tablet by mouth at bedtime., Disp: , Rfl:    insulin glargine (LANTUS) 100 UNIT/ML injection, Inject 46 Units into the skin daily after breakfast. , Disp: , Rfl:    insulin lispro (HUMALOG) 100 UNIT/ML injection, Inject 3 Units into the skin 3 (three) times daily after meals., Disp: , Rfl:    irbesartan (AVAPRO) 150 MG tablet, Take 1 tablet (150 mg total) by mouth daily., Disp: 90 tablet, Rfl: 3   Multiple Vitamin (MULTIVITAMIN WITH MINERALS) TABS tablet, Take 1 tablet by mouth daily., Disp: , Rfl:    omeprazole (PRILOSEC) 20 MG capsule, Take 20 mg by mouth daily as needed (heartburn)., Disp: , Rfl:    thiamine (VITAMIN B-1) 50 MG tablet, Take 150 mg by mouth daily., Disp: , Rfl:    XARELTO 2.5 MG TABS tablet, TAKE 1 TABLET TWICE A DAY, Disp: 180 tablet, Rfl: 3   donepezil (ARICEPT) 5 MG tablet, Take 5 mg by mouth daily., Disp: , Rfl:    ASSESSMENT AND PLAN: .      ICD-10-CM   1. Atherosclerosis of native artery of right lower extremity with intermittent claudication (HCC)  I70.211 EKG 12-Lead    2. Essential hypertension  I10     3. Tobacco use disorder, continuous  F17.209     4. Pure hypercholesterolemia  E78.00       1. Atherosclerosis of native artery of right lower extremity with intermittent claudication (HCC) (Primary) Patient has severe peripheral arterial disease but however fortunately has  done well and no significant symptoms of claudication, however his physical activity is reduced.  His capillary refill is <3 seconds. He is presently on aspirin 81 mg daily and Xarelto 2.5 mg twice daily for severe peripheral arterial disease.  He needs preoperative hold on Xarelto in view of upcoming endoscopy due to dysphagia.  Clear-cut instructions were given to the patient to hold it for 3 days prior to procedure and it can be started back when appropriate by GI. I have sent the letter to Dr. Jeani Hawking. - EKG 12-Lead  2. Essential hypertension Blood pressure is well-controlled on irbesartan 150 mg daily.  3. Tobacco use disorder, continuous Unfortunately still smoking but has reduced smoking.  4. Pure hypercholesterolemia Reviewed his lipids, well-controlled on atorvastatin 40 mg daily, continue the same.  Office visit in a year or sooner if problems.  Other orders - donepezil (ARICEPT) 5 MG tablet; Take 5 mg by mouth daily. - Apoaequorin (PREVAGEN) 10 MG CAPS; Take 10 mg by mouth daily.    Signed,  Yates Decamp, MD, [facility] 05/22/2023, 2:25 PM [facility] [address] [place] [address] [place], [place] [address] Phone: [phone]. Fax:  [phone]

## 2023-06-04 ENCOUNTER — Other Ambulatory Visit: Payer: Self-pay | Admitting: Gastroenterology

## 2023-06-14 ENCOUNTER — Ambulatory Visit
Admission: RE | Admit: 2023-06-14 | Discharge: 2023-06-14 | Disposition: A | Discharge status: Home / Self Care | Payer: Medicare Other | Source: Ambulatory Visit | Attending: Gastroenterology | Admitting: Gastroenterology

## 2023-06-14 DIAGNOSIS — R1310 Dysphagia, unspecified: Secondary | ICD-10-CM

## 2023-06-14 NOTE — Anesthesia Preprocedure Evaluation (Signed)
 Anesthesia Evaluation  Patient identified by MRN, date of birth, ID band Patient awake    Reviewed: Allergy & Precautions, H&P , NPO status , Patient's Chart, lab work & pertinent test results  Airway Mallampati: II  TM Distance: >3 FB Neck ROM: Full    Dental no notable dental hx.    Pulmonary COPD, Current Smoker   Pulmonary exam normal breath sounds clear to auscultation       Cardiovascular hypertension, + Peripheral Vascular Disease  Normal cardiovascular exam Rhythm:Regular Rate:Normal  Hx of PE   Neuro/Psych  Headaches, Seizures -,  PSYCHIATRIC DISORDERS      Hx of hemorrhagic stroke     GI/Hepatic ,GERD  ,,ETOH abuse   Endo/Other  negative endocrine ROSdiabetes    Renal/GU Renal disease  negative genitourinary   Musculoskeletal  (+) Arthritis ,    Abdominal   Peds negative pediatric ROS (+)  Hematology negative hematology ROS (+)   Anesthesia Other Findings   Reproductive/Obstetrics negative OB ROS                             Anesthesia Physical Anesthesia Plan  ASA: 3  Anesthesia Plan: MAC   Post-op Pain Management:    Induction: Intravenous  PONV Risk Score and Plan: Propofol infusion and Treatment may vary due to age or medical condition  Airway Management Planned: Natural Airway  Additional Equipment:   Intra-op Plan:   Post-operative Plan:   Informed Consent: I have reviewed the patients History and Physical, chart, labs and discussed the procedure including the risks, benefits and alternatives for the proposed anesthesia with the patient or authorized representative who has indicated his/her understanding and acceptance.     Dental advisory given  Plan Discussed with: CRNA  Anesthesia Plan Comments:        Anesthesia Quick Evaluation

## 2023-06-15 ENCOUNTER — Encounter (HOSPITAL_COMMUNITY)
Admission: RE | Disposition: A | Discharge status: Home / Self Care | Payer: Self-pay | Source: Home / Self Care | Attending: Gastroenterology

## 2023-06-15 ENCOUNTER — Ambulatory Visit (HOSPITAL_COMMUNITY): Payer: Self-pay

## 2023-06-15 ENCOUNTER — Ambulatory Visit (HOSPITAL_COMMUNITY)
Admission: RE | Admit: 2023-06-15 | Discharge: 2023-06-15 | Disposition: A | Discharge status: Home / Self Care | Attending: Gastroenterology | Admitting: Gastroenterology

## 2023-06-15 ENCOUNTER — Ambulatory Visit (HOSPITAL_BASED_OUTPATIENT_CLINIC_OR_DEPARTMENT_OTHER): Payer: Self-pay

## 2023-06-15 ENCOUNTER — Encounter (HOSPITAL_COMMUNITY): Payer: Self-pay | Admitting: Gastroenterology

## 2023-06-15 ENCOUNTER — Other Ambulatory Visit: Payer: Self-pay

## 2023-06-15 DIAGNOSIS — J449 Chronic obstructive pulmonary disease, unspecified: Secondary | ICD-10-CM | POA: Insufficient documentation

## 2023-06-15 DIAGNOSIS — Z7901 Long term (current) use of anticoagulants: Secondary | ICD-10-CM | POA: Diagnosis not present

## 2023-06-15 DIAGNOSIS — I10 Essential (primary) hypertension: Secondary | ICD-10-CM | POA: Insufficient documentation

## 2023-06-15 DIAGNOSIS — E1151 Type 2 diabetes mellitus with diabetic peripheral angiopathy without gangrene: Secondary | ICD-10-CM | POA: Diagnosis not present

## 2023-06-15 DIAGNOSIS — R1310 Dysphagia, unspecified: Secondary | ICD-10-CM | POA: Insufficient documentation

## 2023-06-15 DIAGNOSIS — F17210 Nicotine dependence, cigarettes, uncomplicated: Secondary | ICD-10-CM

## 2023-06-15 DIAGNOSIS — K449 Diaphragmatic hernia without obstruction or gangrene: Secondary | ICD-10-CM | POA: Insufficient documentation

## 2023-06-15 DIAGNOSIS — K219 Gastro-esophageal reflux disease without esophagitis: Secondary | ICD-10-CM | POA: Insufficient documentation

## 2023-06-15 DIAGNOSIS — Z86711 Personal history of pulmonary embolism: Secondary | ICD-10-CM | POA: Insufficient documentation

## 2023-06-15 DIAGNOSIS — K224 Dyskinesia of esophagus: Secondary | ICD-10-CM | POA: Insufficient documentation

## 2023-06-15 DIAGNOSIS — Z794 Long term (current) use of insulin: Secondary | ICD-10-CM | POA: Diagnosis not present

## 2023-06-15 HISTORY — PX: SAVORY DILATION: SHX5439

## 2023-06-15 HISTORY — PX: ESOPHAGOGASTRODUODENOSCOPY: SHX5428

## 2023-06-15 LAB — GLUCOSE, CAPILLARY
Glucose-Capillary: 248 mg/dL — ABNORMAL HIGH (ref 70–99)
Glucose-Capillary: 217 mg/dL — ABNORMAL HIGH (ref 70–99)

## 2023-06-15 SURGERY — EGD (ESOPHAGOGASTRODUODENOSCOPY)
Anesthesia: Monitor Anesthesia Care

## 2023-06-15 MED ORDER — PROPOFOL 10 MG/ML IV BOLUS
INTRAVENOUS | Status: AC
Start: 2023-06-15 — End: ?
  Filled 2023-06-15: qty 20

## 2023-06-15 MED ORDER — SODIUM CHLORIDE 0.9 % IV SOLN: INTRAVENOUS | Status: DC | PRN

## 2023-06-15 MED ORDER — INSULIN ASPART 100 UNIT/ML IJ SOLN
3.0000 [IU] | Freq: Once | INTRAMUSCULAR | Status: AC
  Administered 2023-06-15: 3 [IU] via SUBCUTANEOUS

## 2023-06-15 MED ORDER — GLYCOPYRROLATE PF 0.2 MG/ML IJ SOSY
PREFILLED_SYRINGE | INTRAMUSCULAR | Status: DC | PRN
Start: 2023-06-15 — End: 2023-06-15
  Administered 2023-06-15 (×2): .1 mg via INTRAVENOUS

## 2023-06-15 MED ORDER — LIDOCAINE 2% (20 MG/ML) 5 ML SYRINGE
INTRAMUSCULAR | Status: DC | PRN
Start: 2023-06-15 — End: 2023-06-15
  Administered 2023-06-15: 40 mg via INTRAVENOUS
  Administered 2023-06-15: 60 mg via INTRAVENOUS

## 2023-06-15 MED ORDER — OXYCODONE HCL 5 MG PO TABS: 5.0000 mg | ORAL_TABLET | Freq: Once | ORAL | Status: DC | PRN

## 2023-06-15 MED ORDER — INSULIN ASPART 100 UNIT/ML IJ SOLN
INTRAMUSCULAR | Status: AC
  Filled 2023-06-15: qty 1

## 2023-06-15 MED ORDER — FENTANYL CITRATE (PF) 100 MCG/2ML IJ SOLN: 25.0000 ug | INTRAMUSCULAR | Status: DC | PRN

## 2023-06-15 MED ORDER — ACETAMINOPHEN 10 MG/ML IV SOLN: 1000.0000 mg | Freq: Once | INTRAVENOUS | Status: DC | PRN

## 2023-06-15 MED ORDER — OXYCODONE HCL 5 MG/5ML PO SOLN: 5.0000 mg | Freq: Once | ORAL | Status: DC | PRN

## 2023-06-15 MED ORDER — DROPERIDOL 2.5 MG/ML IJ SOLN: 0.6250 mg | Freq: Once | INTRAMUSCULAR | Status: DC | PRN

## 2023-06-15 MED ORDER — PROPOFOL 1000 MG/100ML IV EMUL
INTRAVENOUS | Status: AC
Start: 2023-06-15 — End: ?
  Filled 2023-06-15: qty 100

## 2023-06-15 MED ORDER — PROPOFOL 10 MG/ML IV BOLUS
INTRAVENOUS | Status: DC | PRN
  Administered 2023-06-15: 70 ug via INTRAVENOUS
  Administered 2023-06-15: 30 ug via INTRAVENOUS

## 2023-06-15 MED ORDER — PROPOFOL 500 MG/50ML IV EMUL
INTRAVENOUS | Status: DC | PRN
  Administered 2023-06-15: 125 ug/kg/min via INTRAVENOUS

## 2023-06-15 NOTE — Op Note (Signed)
 [facility] Patient Name: Terry Munoz Procedure Date: 06/15/2023 MRN: [id number] Attending MD: Jeani Hawking , MD, [id number] Date of Birth: 10-28-1950 CSN: [id number] Age: 73 Admit Type: Outpatient Procedure:                Upper GI endoscopy Indications:              Dysphagia Providers:                Jeani Hawking, MD, Fransisca Connors, Alan Ripper,                            Technician Referring MD:              Medicines:                Propofol per Anesthesia Complications:            No immediate complications. Estimated Blood Loss:     Estimated blood loss: none. Procedure:                Pre-Anesthesia Assessment:                           - Prior to the procedure, a History and Physical                            was performed, and patient medications and                            allergies were reviewed. The patient's tolerance of                            previous anesthesia was also reviewed. The risks                            and benefits of the procedure and the sedation                            options and risks were discussed with the patient.                            All questions were answered, and informed consent                            was obtained. Prior Anticoagulants: The patient has                            taken Xarelto (rivaroxaban), last dose was 2 days                            prior to procedure. ASA Grade Assessment: III - A                            patient with severe systemic disease. After                            reviewing  the risks and benefits, the patient was                            deemed in satisfactory condition to undergo the                            procedure.                           After obtaining informed consent, the endoscope was                            passed under direct vision. Throughout the                            procedure, the patient's blood pressure, pulse, and                             oxygen saturations were monitored continuously. The                            [id number]-H190 ([id number]) Olympus endoscope was introduced                            through the mouth, and advanced to the second part                            of duodenum. The upper GI endoscopy was                            accomplished without difficulty. The patient                            tolerated the procedure well. Findings:      A 3 cm hiatal hernia was present.      No endoscopic abnormality was evident in the esophagus to explain the       patient's complaint of dysphagia. It was decided, however, to proceed       with dilation of the entire esophagus. A guidewire was placed and the       scope was withdrawn. Dilation was performed with a Savary dilator with       no resistance at 18 mm. The dilation site was examined following       endoscope reinsertion and showed no change.      A large amount of food (residue) was found in the gastric fundus and in       the gastric antrum.      The examined duodenum was normal.      In the distal esophagus there was easy reflux of solid and liquid       contents. The material was quickly suctioned. There was evidence of a       sliding hiatal hernia estimated to be 3 cm. A gross stenosis was not       identified and no subtle stenosis was unmasked with the 18 mm Savary       dilation. The dilator was inserted with ease  and no resistance was       encountered. In the antrum of the stomach there was a significant amount       of retained gastric contents, but no evidence of pyloric stenosis. Impression:               - 3 cm hiatal hernia.                           - No endoscopic esophageal abnormality to explain                            patient's dysphagia. Esophagus dilated. Dilated.                           - A large amount of food (residue) in the stomach.                           - Normal examined duodenum.                           - No  specimens collected. Moderate Sedation:      Not Applicable - Patient had care per Anesthesia. Recommendation:           - Patient has a contact number available for                            emergencies. The signs and symptoms of potential                            delayed complications were discussed with the                            patient. Return to normal activities tomorrow.                            Written discharge instructions were provided to the                            patient.                           - Resume previous diet.                           - Continue present medications.                           - Resume Xarelto.                           - Arrange for Speech Pathology evaluation.                           - Follow up in the office in 1 month. Procedure Code(s):        --- Professional ---  16109, Esophagogastroduodenoscopy, flexible,                            transoral; with insertion of guide wire followed by                            passage of dilator(s) through esophagus over guide                            wire Diagnosis Code(s):        --- Professional ---                           K44.9, Diaphragmatic hernia without obstruction or                            gangrene                           R13.10, Dysphagia, unspecified CPT copyright 2022 American Medical Association. All rights reserved. The codes documented in this report are preliminary and upon coder review may  be revised to meet current compliance requirements. Jeani Hawking, MD Jeani Hawking, MD 06/15/2023 8:10:14 AM This report has been signed electronically. Number of Addenda: 0

## 2023-06-15 NOTE — Discharge Instructions (Signed)
YOU HAD AN ENDOSCOPIC PROCEDURE TODAY: Refer to the procedure report and other information in the discharge instructions given to you for any specific questions about what was found during the examination. If this information does not answer your questions, please call [facility] at [phone] to clarify.   YOU SHOULD EXPECT: Some feelings of bloating in the abdomen. Passage of more gas than usual. Walking can help get rid of the air that was put into your GI tract during the procedure and reduce the bloating.  DIET: Your first meal following the procedure should be a light meal and then it is ok to progress to your normal diet. A half-sandwich or bowl of soup is an example of a good first meal. Heavy or fried foods are harder to digest and may make you feel nauseous or bloated. Drink plenty of fluids but you should avoid alcoholic beverages for 24 hours. If you had an esophageal dilation, please see attached information for diet.   ACTIVITY: Your care partner should take you home directly after the procedure. You should plan to take it easy, moving slowly for the rest of the day. You can resume normal activity the day after the procedure however YOU SHOULD NOT DRIVE, use power tools, machinery or perform tasks that involve climbing or major physical exertion for 24 hours (because of the sedation medicines used during the test).   SYMPTOMS TO REPORT IMMEDIATELY: A gastroenterologist can be reached at any hour. Please call [phone]  for any of the following symptoms:   Following upper endoscopy (EGD, EUS, ERCP, esophageal dilation) Vomiting of blood or coffee ground material  New, significant abdominal pain  New, significant chest pain or pain under the shoulder blades  Painful or persistently difficult swallowing  New shortness of breath  Black, tarry-looking or red, bloody stools  FOLLOW UP:  If any biopsies were taken you will be contacted by phone or by letter within the next  1-3 weeks. Call [phone]  if you have not heard about the biopsies in 3 weeks.  Please also call with any specific questions about appointments or follow up tests.  

## 2023-06-15 NOTE — H&P (Signed)
 Terry Munoz HPI: He had an EGD with Dr. Loreta Munoz on 05/2010 for complaints of nausea and it was a normal examination.  His wife reports that his "choking" symptoms over the past year.  This is only with solid food.  He does not report any issues with liquids.  The patient denies any hematemesis.  The patient denies any weight loss as a result of the choking sensation.  He feels that he has good control of his GERD with omeprazole.  Dr. Jacinto Munoz manages his anticoagulation. His esophagram yesterday was aborted as there was evidence of some aspiration.  He states that he is not struggling with coughing and his symptoms with the esophagram are different from his choking complaints.  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   Hyperlipidemia 10/04/2019   Hypertension    Hyponatremia 03/2014   Insomnia    Kidney stones    Mouth ulcers    Osteoarthritis    Otitis media    Peripheral vascular disease (HCC)    Sinusitis     Past Surgical History:  Procedure Laterality Date   DENTAL SURGERY     FEMORAL-POPLITEAL BYPASS GRAFT Right 07/24/2015   Procedure: Thrombectomy Right Femoral-Popliteal Artery; Thrombectomy Right Anterior-Tibial Artery, Thrombectomy Right Tibial-Peroneal Trunk; Bovine Patch Angioplasty Right Popliteal Artery; Endarterectomy Right Anterior Tibial Artery and Right Tibial-Peroneal Trunk; Right Leg Angiogram, Four Compartment Fasciotomy Right Lower Leg;  Surgeon: Terry Hertz, MD;  Location: [facility] OR;  Service: Vascular;  L   HERNIA REPAIR     lithotomy     LOWER EXTREMITY ANGIOGRAM N/A 05/01/2014   Procedure: LOWER EXTREMITY ANGIOGRAM;  Surgeon: Terry Pert, MD;  Location[facility] CATH LAB;  Service: Cardiovascular;  Laterality: N/A;   LOWER EXTREMITY ANGIOGRAPHY N/A 05/27/2021   Procedure: Lower Extremity Angiography;  Surgeon: Terry Douglas, MD;  [facility] INVASIVE  CV LAB;  Service: Cardiovascular;  Laterality: N/A;   NECK SURGERY     POPLITEAL ARTERY ANGIOPLASTY Right 05/02/2014   to mid sfa   THROMBECTOMY  05/02/2014    Family History  Problem Relation Age of Onset   Heart disease Mother    Hypertension Mother    Hypertension Father    Hypertension Sister    Hypertension Brother    Diabetes Brother    Hypertension Sister     Social History:  reports that he has been smoking cigarettes. He has a 135 pack-year smoking history. He has never used smokeless tobacco. He reports that he does not currently use alcohol after a past usage of about 42.0 standard drinks of alcohol per week. He reports that he does not use drugs.  Allergies:  Allergies  Allergen Reactions   Sulfa Antibiotics Hives    Medications: Scheduled: Continuous:  acetaminophen      Results for orders placed or performed during the hospital encounter of 06/15/23 (from the past 24 hours)  Glucose, capillary     Status: Abnormal   Collection Time: 06/15/23  6:53 AM  Result Value Ref Range   Glucose-Capillary 248 (H) 70 - 99 mg/dL     DG ESOPHAGUS W DOUBLE CM (HD) Result Date: 06/14/2023 CLINICAL DATA:  Dysphagia. Patient gets "strangled" on solids and liquids. Choking sensation in his throat. No issues swallowing pills. No prior endoscopy. EXAM: ESOPHOGRAM / BARIUM SWALLOW / BARIUM TABLET STUDY TECHNIQUE: Combined double contrast and single contrast examination  performed using effervescent crystals, thick barium liquid, and thin barium liquid. The patient was observed with fluoroscopy swallowing a 13 mm barium sulphate tablet. FLUOROSCOPY: Radiation Exposure Index (as provided by the fluoroscopic device): 6.6 mGy Kerma COMPARISON:  None Available. FINDINGS: Swallowing: Initial test swallow of thin liquid demonstrated trace penetration. Follow-up evaluation with thick barium demonstrated mild aspiration below the vocal cords. The patient coughed repeatedly when this occurred. The  examination was stopped at that time. Pharynx: Unremarkable. Esophagus: Normal appearance. Esophageal motility: Intermittent tertiary contractions in the mid and distal esophagus. Hiatal Hernia: None. Gastroesophageal reflux: Not assessed. Ingested 13mm barium tablet: Passed normally. Other: None. IMPRESSION: 1. Positive for small volume aspiration with reflex coughing. Recommend further evaluation with modified barium swallow with speech pathology. 2. Mild esophageal dysmotility. Electronically Signed   By: Terry Munoz M.D.   On: 06/14/2023 14:26    ROS:  As stated above in the HPI otherwise negative.  Blood pressure (!) 184/83, pulse 95, temperature (!) 97.5 F (36.4 C), temperature source Tympanic, resp. rate 19, height 5\' 8"  (1.727 m), weight 69.5 kg, SpO2 96%.    PE: Gen: NAD, Alert and Oriented HEENT:  [name]/AT, EOMI Neck: Supple, no LAD Lungs: CTA Bilaterally CV: RRR without M/G/R ABD: Soft, NTND, +BS Ext: No C/C/E  Assessment/Plan: 1) Dysphagia - EGD with dilation.  Terry Munoz D 06/15/2023, 7:25 AM

## 2023-06-15 NOTE — Transfer of Care (Addendum)
 Immediate Anesthesia Transfer of Care Note  Patient: MYAN SUIT  Procedure(s) Performed: EGD (ESOPHAGOGASTRODUODENOSCOPY) EGD, WITH DILATION USING SAVARY-GILLIARD DILATOR OVER GUIDEWIRE  Patient Location: Endoscopy Unit  Anesthesia Type:MAC  Level of Consciousness: awake, drowsy, and patient cooperative  Airway & Oxygen Therapy: Patient Spontanous Breathing and Patient connected to face mask oxygen  Post-op Assessment: Report given to RN and Post -op Vital signs reviewed and stable  Post vital signs: Reviewed and stable  Last Vitals:  Vitals Value Taken Time  BP 113/63 06/15/23 0749  Temp 36.8 06/15/23   0749  Pulse 111 06/15/23 0750  Resp 26 06/15/23 0750  SpO2 98 % 06/15/23 0750  Vitals shown include unfiled device data.  Last Pain:  Vitals:   06/15/23 0749  TempSrc:   PainSc: 0-No pain         Complications: No notable events documented.

## 2023-06-15 NOTE — Anesthesia Postprocedure Evaluation (Signed)
 Anesthesia Post Note  Patient: Terry Munoz  Procedure(s) Performed: EGD (ESOPHAGOGASTRODUODENOSCOPY) EGD, WITH DILATION USING SAVARY-GILLIARD DILATOR OVER GUIDEWIRE     Patient location during evaluation: PACU Anesthesia Type: MAC Level of consciousness: awake and alert Pain management: pain level controlled Vital Signs Assessment: post-procedure vital signs reviewed and stable Respiratory status: spontaneous breathing, nonlabored ventilation, respiratory function stable and patient connected to nasal cannula oxygen Cardiovascular status: stable and blood pressure returned to baseline Postop Assessment: no apparent nausea or vomiting Anesthetic complications: no   No notable events documented.  Last Vitals:  Vitals:   06/15/23 0750 06/15/23 0800  BP: 129/67 125/72  Pulse: (!) 105 (!) 109  Resp: (!) 22 (!) 22  Temp:    SpO2: 98% 93%    Last Pain:  Vitals:   06/15/23 0800  TempSrc:   PainSc: 0-No pain                 [place]

## 2023-06-17 ENCOUNTER — Encounter (HOSPITAL_COMMUNITY): Payer: Self-pay | Admitting: Gastroenterology

## 2023-06-21 ENCOUNTER — Other Ambulatory Visit (HOSPITAL_COMMUNITY): Payer: Self-pay | Admitting: *Deleted

## 2023-06-21 DIAGNOSIS — R059 Cough, unspecified: Secondary | ICD-10-CM

## 2023-06-21 DIAGNOSIS — R1310 Dysphagia, unspecified: Secondary | ICD-10-CM

## 2023-07-03 ENCOUNTER — Ambulatory Visit (HOSPITAL_COMMUNITY)
Admission: RE | Admit: 2023-07-03 | Discharge: 2023-07-03 | Disposition: A | Discharge status: Home / Self Care | Source: Ambulatory Visit | Attending: Family Medicine | Admitting: Family Medicine

## 2023-07-03 DIAGNOSIS — K219 Gastro-esophageal reflux disease without esophagitis: Secondary | ICD-10-CM | POA: Insufficient documentation

## 2023-07-03 DIAGNOSIS — R1312 Dysphagia, oropharyngeal phase: Secondary | ICD-10-CM | POA: Insufficient documentation

## 2023-07-03 DIAGNOSIS — R059 Cough, unspecified: Secondary | ICD-10-CM

## 2023-07-03 DIAGNOSIS — R1310 Dysphagia, unspecified: Secondary | ICD-10-CM

## 2023-07-03 NOTE — Procedures (Signed)
 Modified Barium Swallow Study  Patient Details  Name: Terry Munoz MRN: [id number] Date of Birth: 26-Jan-1951  Today's Date: 07/03/2023  HPI/PMH: HPI: Patient is a 73 year old male referred by GI presenting with complaints of "choking" symptoms during consumption of PO (solids>liquids). Esophagram terminated on 3/20 due to evidence of aspiration. Patient with recent EGD and dx of GERD managed by omeprazole. PMH: COPD, DM, HTN, peripheral artery disease, HLD   Clinical Impression: Patient presents with moderate oropharyngeal dysphagia. Oral phase is characterized by reduced lingual control/coordination resulting in occasional premature spillage to the pyriform sinuses. Pharyngeal phase is remarkable for incomplete epiglottic inversion, reduced hyoid excursion and incomplete laryngeal vestibule closure resulting in penetration and eventual aspiration (PAS 7) of thin and NTL. Patient was sensate to aspiration, though spontaneous coughing was unsuccessful in clearance of aspirate. Chin tuck was effective in eliminating aspiration of liquids. Patient with trace, though stagnant, penetration of purees and solids. Patient with consistent moderate vallecular and pyriform sinus residuals, though cleared through additional swallows and liquid wash. Patient consumed pill whole in puree with no difficulty. Recommend continuation of regular/thin liquid diet with use of chin tuck and liquid wash vs additional swallows for clearance of residuals. Patient and wife report increased swallowing difficulty with dry, flaky solids, therefore SLP recommended use of extra condiments vs consumption of moist solids. SLP recommended f/u ST to initiate oropharyngeal strengthening exercises. Patient and wife verbalized understanding of all education provided with no further questions.   DIGEST Swallow Severity Rating*  Safety: 2  Efficiency: 1  Overall Pharyngeal Swallow Severity: 2 1: mild; 2: moderate; 3: severe; 4: profound   *The Dynamic Imaging Grade of Swallowing Toxicity is standardized for the head and neck cancer population, however, demonstrates promising clinical applications across populations to standardize the clinical rating of pharyngeal swallow safety and severity.   Recommendations/Plan: Swallowing Evaluation Recommendations Swallowing Evaluation Recommendations Recommendations: PO diet PO Diet Recommendation: Regular; Thin liquids (Level 0) Liquid Administration via: Cup; Straw Medication Administration: Whole meds with puree Supervision: Patient able to self-feed; Intermittent supervision/cueing for swallowing strategies Swallowing strategies  : Minimize environmental distractions; Small bites/sips; Slow rate; Chin tuck; Clear throat intermittently Postural changes: Position pt fully upright for meals Oral care recommendations: Oral care BID (2x/day)    Treatment Plan Treatment Plan Treatment recommendations: Therapy as outlined in treatment plan below Follow-up recommendations: Outpatient SLP Recommendations Comment: oropharyngeal strengthin exercises Functional status assessment: Patient has had a recent decline in their functional status and demonstrates the ability to make significant improvements in function in a reasonable and predictable amount of time. Treatment frequency: Min 1x/week Treatment duration: 2 weeks Interventions: Aspiration precaution training; Oropharyngeal exercises; Compensatory techniques; Patient/family education     Recommendations Recommendations for follow up therapy are one component of a multi-disciplinary discharge planning process, led by the attending physician.  Recommendations may be updated based on patient status, additional functional criteria and insurance authorization.  Assessment: Orofacial Exam: Orofacial Exam Oral Cavity: Oral Hygiene: WFL Oral Cavity - Dentition: Missing dentition Orofacial Anatomy: WFL Oral Motor/Sensory Function:  WFL    Anatomy:  Anatomy: Presence of cervical hardware   Boluses Administered: Boluses Administered Boluses Administered: Thin liquids (Level 0); Mildly thick liquids (Level 2, nectar thick); Puree; Solid     Oral Impairment Domain: Oral Impairment Domain Lip Closure: No labial escape Tongue control during bolus hold: Escape to lateral buccal cavity/floor of mouth Bolus preparation/mastication: Timely and efficient chewing and mashing Bolus transport/lingual motion: Brisk tongue motion Oral residue:  Trace residue lining oral structures Location of oral residue : Tongue Initiation of pharyngeal swallow : Pyriform sinuses     Pharyngeal Impairment Domain: Pharyngeal Impairment Domain Soft palate elevation: Trace column of contrast or air between SP and PW Laryngeal elevation: Partial superior movement of thyroid cartilage/partial approximation of arytenoids to epiglottic petiole Anterior hyoid excursion: Partial anterior movement Epiglottic movement: Partial inversion Laryngeal vestibule closure: Incomplete, narrow column air/contrast in laryngeal vestibule Pharyngeal stripping wave : Present - diminished Pharyngoesophageal segment opening: Complete distension and complete duration, no obstruction of flow Tongue base retraction: Trace column of contrast or air between tongue base and PPW Pharyngeal residue: Collection of residue within or on pharyngeal structures Location of pharyngeal residue: Valleculae; Pharyngeal wall; Pyriform sinuses; Diffuse (>3 areas)     Esophageal Impairment Domain: Esophageal Impairment Domain Esophageal clearance upright position: Complete clearance, esophageal coating    Pill: Pill Consistency administered: Puree Puree: WFL    Penetration/Aspiration Scale Score: Penetration/Aspiration Scale Score 1.  Material does not enter airway: Pill 3.  Material enters airway, remains ABOVE vocal cords and not ejected out: Puree; Solid 7.   Material enters airway, passes BELOW cords and not ejected out despite cough attempt by patient: Thin liquids (Level 0); Mildly thick liquids (Level 2, nectar thick)    Compensatory Strategies: Compensatory Strategies Compensatory strategies: Yes Straw: Effective Effective Straw: Thin liquid (Level 0) Multiple swallows: Effective Effective Multiple Swallows: Thin liquid (Level 0); Puree Chin tuck: Effective Effective Chin Tuck: Thin liquid (Level 0)       General Information: Caregiver present: Yes   Diet Prior to this Study: Regular; Thin liquids (Level 0)    No data recorded   Respiratory Status: WFL    Supplemental O2: None (Room air)    No data recorded  Behavior/Cognition: Alert; Cooperative; Pleasant mood  Self-Feeding Abilities: Able to self-feed  Baseline vocal quality/speech: Normal  Volitional Cough: Able to elicit  Volitional Swallow: Able to elicit  Exam Limitations: No limitations   Goal Planning: Prognosis for improved oropharyngeal function: Good  Barriers to Reach Goals: Motivation  No data recorded Patient/Family Stated Goal: reduce coughing during mealtimes  Consulted and agree with results and recommendations: Patient; Family member/caregiver   Pain: Pain Assessment Pain Assessment: No/denies pain    End of Session: Start Time:SLP Start Time (ACUTE ONLY): 1125  Stop Time: SLP Stop Time (ACUTE ONLY): 1150  Time Calculation:SLP Time Calculation (min) (ACUTE ONLY): 25 min  Charges: SLP Evaluations $ SLP Speech Visit: 1 Visit  SLP Evaluations $MBS Swallow: 1 Procedure   SLP visit diagnosis: SLP Visit Diagnosis: Dysphagia, oropharyngeal phase (R13.12)    Past Medical History:  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   Hyperlipidemia 10/04/2019   Hypertension    Hyponatremia 03/2014   Insomnia    Kidney  stones    Mouth ulcers    Osteoarthritis    Otitis media    Peripheral vascular disease (HCC)    Sinusitis    Past Surgical History:  Past Surgical History:  Procedure Laterality Date   DENTAL SURGERY     ESOPHAGOGASTRODUODENOSCOPY N/A 06/15/2023   Procedure: EGD (ESOPHAGOGASTRODUODENOSCOPY);  Surgeon: Jeani Hawking, MD;  Location: [facility];  Service: Gastroenterology;  Laterality: N/A;   FEMORAL-POPLITEAL BYPASS GRAFT Right 07/24/2015   Procedure: Thrombectomy Right Femoral-Popliteal Artery; Thrombectomy Right Anterior-Tibial Artery, Thrombectomy Right Tibial-Peroneal Trunk; Bovine Patch Angioplasty Right Popliteal  Artery; Endarterectomy Right Anterior Tibial Artery and Right Tibial-Peroneal Trunk; Right Leg Angiogram, Four Compartment Fasciotomy Right Lower Leg;  Surgeon: Fransisco Hertz, MD;  Location: [facility] OR;  Service: Vascular;  L   HERNIA REPAIR     lithotomy     LOWER EXTREMITY ANGIOGRAM N/A 05/01/2014   Procedure: LOWER EXTREMITY ANGIOGRAM;  Surgeon: Pamella Pert, MD;  Location: [facility] CATH LAB;  Service: Cardiovascular;  Laterality: N/A;   LOWER EXTREMITY ANGIOGRAPHY N/A 05/27/2021   Procedure: Lower Extremity Angiography;  Surgeon: Leonie Douglas, MD;  Location: [facility] INVASIVE CV LAB;  Service: Cardiovascular;  Laterality: N/A;   NECK SURGERY     POPLITEAL ARTERY ANGIOPLASTY Right 05/02/2014   to mid sfa   SAVORY DILATION N/A 06/15/2023   Procedure: EGD, WITH DILATION USING SAVARY-GILLIARD DILATOR OVER GUIDEWIRE;  Surgeon: Jeani Hawking, MD;  Location: [facility] ENDOSCOPY;  Service: Gastroenterology;  Laterality: N/A;   THROMBECTOMY  05/02/2014    Trig Mcbryar M.A., CCC-SLP 07/03/2023, 1:08 PM

## 2023-07-27 ENCOUNTER — Other Ambulatory Visit: Payer: Self-pay | Admitting: Cardiology

## 2024-03-17 ENCOUNTER — Other Ambulatory Visit: Payer: Self-pay | Admitting: Cardiology

## 2024-03-17 DIAGNOSIS — I739 Peripheral vascular disease, unspecified: Secondary | ICD-10-CM

## 2024-03-18 NOTE — Telephone Encounter (Signed)
 Prescription refill request for Xarelto  received.  Indication: Pulmonary Embolism Last office visit: 05/22/23 Weight: 69.5 KG Age: 73 Scr: 1.31 01/10/24 Care Everywhere CrCl: 48 mL/min
